# Patient Record
Sex: Female | Born: 1935 | Race: White | Hispanic: No | Smoking: Never smoker
Health system: Southern US, Community
[De-identification: ages and names within clinical notes are randomized; demographics above are authoritative.]

## PROBLEM LIST (undated history)

## (undated) DIAGNOSIS — F419 Anxiety disorder, unspecified: Secondary | ICD-10-CM

## (undated) DIAGNOSIS — F32A Depression, unspecified: Secondary | ICD-10-CM

## (undated) DIAGNOSIS — I2583 Coronary atherosclerosis due to lipid rich plaque: Secondary | ICD-10-CM

## (undated) DIAGNOSIS — Z66 Do not resuscitate: Secondary | ICD-10-CM

## (undated) DIAGNOSIS — J9600 Acute respiratory failure, unspecified whether with hypoxia or hypercapnia: Secondary | ICD-10-CM

## (undated) DIAGNOSIS — M1990 Unspecified osteoarthritis, unspecified site: Secondary | ICD-10-CM

## (undated) DIAGNOSIS — I739 Peripheral vascular disease, unspecified: Secondary | ICD-10-CM

## (undated) DIAGNOSIS — D649 Anemia, unspecified: Secondary | ICD-10-CM

## (undated) DIAGNOSIS — I499 Cardiac arrhythmia, unspecified: Secondary | ICD-10-CM

## (undated) DIAGNOSIS — I2510 Atherosclerotic heart disease of native coronary artery without angina pectoris: Secondary | ICD-10-CM

## (undated) DIAGNOSIS — K219 Gastro-esophageal reflux disease without esophagitis: Secondary | ICD-10-CM

## (undated) DIAGNOSIS — G2581 Restless legs syndrome: Secondary | ICD-10-CM

## (undated) DIAGNOSIS — I10 Essential (primary) hypertension: Secondary | ICD-10-CM

## (undated) DIAGNOSIS — F329 Major depressive disorder, single episode, unspecified: Secondary | ICD-10-CM

## (undated) DIAGNOSIS — N189 Chronic kidney disease, unspecified: Secondary | ICD-10-CM

## (undated) DIAGNOSIS — N318 Other neuromuscular dysfunction of bladder: Secondary | ICD-10-CM

## (undated) HISTORY — PX: COLON SURGERY: SHX602

## (undated) HISTORY — PX: TONSILLECTOMY: SUR1361

## (undated) HISTORY — PX: APPENDECTOMY: SHX54

## (undated) HISTORY — PX: ABDOMINAL HYSTERECTOMY: SHX81

## (undated) HISTORY — PX: CARDIAC CATHETERIZATION: SHX172

## (undated) HISTORY — PX: DIAPHRAGMATIC HERNIA REPAIR: SHX413

---

## 1997-08-17 ENCOUNTER — Encounter: Admission: RE | Admit: 1997-08-17 | Discharge: 1997-11-15 | Payer: Self-pay | Admitting: Internal Medicine

## 1997-09-25 ENCOUNTER — Inpatient Hospital Stay (HOSPITAL_COMMUNITY): Admission: EM | Admit: 1997-09-25 | Discharge: 1997-10-04 | Payer: Self-pay | Admitting: Emergency Medicine

## 1998-02-17 ENCOUNTER — Encounter: Admission: RE | Admit: 1998-02-17 | Discharge: 1998-03-02 | Payer: Self-pay | Admitting: Internal Medicine

## 1998-04-02 ENCOUNTER — Inpatient Hospital Stay (HOSPITAL_COMMUNITY): Admission: EM | Admit: 1998-04-02 | Discharge: 1998-04-08 | Payer: Self-pay | Admitting: Emergency Medicine

## 1998-06-06 ENCOUNTER — Encounter: Admission: RE | Admit: 1998-06-06 | Discharge: 1998-06-08 | Payer: Self-pay | Admitting: Orthopedic Surgery

## 1998-09-11 ENCOUNTER — Encounter: Admission: RE | Admit: 1998-09-11 | Discharge: 1998-09-15 | Payer: Self-pay | Admitting: Internal Medicine

## 1998-10-17 ENCOUNTER — Encounter: Admission: RE | Admit: 1998-10-17 | Discharge: 1999-01-15 | Payer: Self-pay | Admitting: Internal Medicine

## 1999-03-12 ENCOUNTER — Encounter: Admission: RE | Admit: 1999-03-12 | Discharge: 1999-06-10 | Payer: Self-pay | Admitting: Orthopedic Surgery

## 1999-04-25 ENCOUNTER — Ambulatory Visit (HOSPITAL_COMMUNITY): Admission: RE | Admit: 1999-04-25 | Discharge: 1999-04-25 | Payer: Self-pay | Admitting: Cardiology

## 1999-07-09 ENCOUNTER — Encounter: Admission: RE | Admit: 1999-07-09 | Discharge: 1999-10-07 | Payer: Self-pay | Admitting: Orthopedic Surgery

## 2000-01-01 ENCOUNTER — Encounter: Admission: RE | Admit: 2000-01-01 | Discharge: 2000-03-31 | Payer: Self-pay | Admitting: Orthopedic Surgery

## 2000-04-22 ENCOUNTER — Encounter: Admission: RE | Admit: 2000-04-22 | Discharge: 2000-04-28 | Payer: Self-pay

## 2000-08-12 ENCOUNTER — Encounter: Admission: RE | Admit: 2000-08-12 | Discharge: 2000-08-19 | Payer: Self-pay | Admitting: Orthopedic Surgery

## 2000-11-19 ENCOUNTER — Encounter: Admission: RE | Admit: 2000-11-19 | Discharge: 2000-11-24 | Payer: Self-pay | Admitting: Internal Medicine

## 2001-03-25 ENCOUNTER — Encounter: Admission: RE | Admit: 2001-03-25 | Discharge: 2001-03-30 | Payer: Self-pay | Admitting: Internal Medicine

## 2001-07-27 ENCOUNTER — Encounter (HOSPITAL_BASED_OUTPATIENT_CLINIC_OR_DEPARTMENT_OTHER): Admission: RE | Admit: 2001-07-27 | Discharge: 2001-08-07 | Payer: Self-pay | Admitting: Internal Medicine

## 2001-10-21 ENCOUNTER — Encounter (HOSPITAL_BASED_OUTPATIENT_CLINIC_OR_DEPARTMENT_OTHER): Admission: RE | Admit: 2001-10-21 | Discharge: 2002-01-19 | Payer: Self-pay | Admitting: Internal Medicine

## 2002-02-12 ENCOUNTER — Encounter (HOSPITAL_BASED_OUTPATIENT_CLINIC_OR_DEPARTMENT_OTHER): Admission: RE | Admit: 2002-02-12 | Discharge: 2002-05-13 | Payer: Self-pay | Admitting: Internal Medicine

## 2002-06-10 ENCOUNTER — Emergency Department (HOSPITAL_COMMUNITY): Admission: EM | Admit: 2002-06-10 | Discharge: 2002-06-11 | Payer: Self-pay | Admitting: Emergency Medicine

## 2002-06-11 ENCOUNTER — Encounter: Payer: Self-pay | Admitting: Emergency Medicine

## 2002-06-14 ENCOUNTER — Encounter: Payer: Self-pay | Admitting: Urology

## 2002-06-15 ENCOUNTER — Observation Stay (HOSPITAL_COMMUNITY): Admission: RE | Admit: 2002-06-15 | Discharge: 2002-06-16 | Payer: Self-pay | Admitting: Urology

## 2002-06-15 ENCOUNTER — Encounter: Payer: Self-pay | Admitting: Urology

## 2002-06-18 ENCOUNTER — Encounter (HOSPITAL_BASED_OUTPATIENT_CLINIC_OR_DEPARTMENT_OTHER): Admission: RE | Admit: 2002-06-18 | Discharge: 2002-09-16 | Payer: Self-pay | Admitting: Internal Medicine

## 2002-10-08 ENCOUNTER — Encounter (HOSPITAL_BASED_OUTPATIENT_CLINIC_OR_DEPARTMENT_OTHER): Admission: RE | Admit: 2002-10-08 | Discharge: 2002-10-22 | Payer: Self-pay | Admitting: Internal Medicine

## 2003-01-21 ENCOUNTER — Encounter (HOSPITAL_BASED_OUTPATIENT_CLINIC_OR_DEPARTMENT_OTHER): Admission: RE | Admit: 2003-01-21 | Discharge: 2003-03-04 | Payer: Self-pay | Admitting: Internal Medicine

## 2003-05-20 ENCOUNTER — Encounter (HOSPITAL_BASED_OUTPATIENT_CLINIC_OR_DEPARTMENT_OTHER): Admission: RE | Admit: 2003-05-20 | Discharge: 2003-06-02 | Payer: Self-pay | Admitting: Internal Medicine

## 2003-08-24 ENCOUNTER — Encounter (HOSPITAL_BASED_OUTPATIENT_CLINIC_OR_DEPARTMENT_OTHER): Admission: RE | Admit: 2003-08-24 | Discharge: 2003-09-09 | Payer: Self-pay | Admitting: Internal Medicine

## 2003-10-04 ENCOUNTER — Emergency Department (HOSPITAL_COMMUNITY): Admission: EM | Admit: 2003-10-04 | Discharge: 2003-10-04 | Payer: Self-pay | Admitting: Emergency Medicine

## 2003-11-29 ENCOUNTER — Encounter (HOSPITAL_BASED_OUTPATIENT_CLINIC_OR_DEPARTMENT_OTHER): Admission: RE | Admit: 2003-11-29 | Discharge: 2003-12-21 | Payer: Self-pay | Admitting: Internal Medicine

## 2004-03-12 ENCOUNTER — Encounter (HOSPITAL_BASED_OUTPATIENT_CLINIC_OR_DEPARTMENT_OTHER): Admission: RE | Admit: 2004-03-12 | Discharge: 2004-03-28 | Payer: Self-pay | Admitting: Internal Medicine

## 2004-12-05 ENCOUNTER — Ambulatory Visit: Payer: Self-pay | Admitting: Gastroenterology

## 2004-12-20 ENCOUNTER — Encounter (INDEPENDENT_AMBULATORY_CARE_PROVIDER_SITE_OTHER): Payer: Self-pay | Admitting: Specialist

## 2004-12-20 ENCOUNTER — Ambulatory Visit: Payer: Self-pay | Admitting: Gastroenterology

## 2004-12-20 DIAGNOSIS — K5730 Diverticulosis of large intestine without perforation or abscess without bleeding: Secondary | ICD-10-CM | POA: Insufficient documentation

## 2004-12-20 DIAGNOSIS — D126 Benign neoplasm of colon, unspecified: Secondary | ICD-10-CM

## 2004-12-20 DIAGNOSIS — K222 Esophageal obstruction: Secondary | ICD-10-CM

## 2005-01-02 ENCOUNTER — Ambulatory Visit: Payer: Self-pay | Admitting: Gastroenterology

## 2005-04-04 ENCOUNTER — Encounter (HOSPITAL_BASED_OUTPATIENT_CLINIC_OR_DEPARTMENT_OTHER): Admission: RE | Admit: 2005-04-04 | Discharge: 2005-04-19 | Payer: Self-pay | Admitting: Surgery

## 2005-05-01 ENCOUNTER — Encounter (HOSPITAL_BASED_OUTPATIENT_CLINIC_OR_DEPARTMENT_OTHER): Admission: RE | Admit: 2005-05-01 | Discharge: 2005-05-17 | Payer: Self-pay | Admitting: Internal Medicine

## 2006-06-25 ENCOUNTER — Encounter: Admission: RE | Admit: 2006-06-25 | Discharge: 2006-06-25 | Payer: Self-pay | Admitting: Cardiology

## 2008-02-14 ENCOUNTER — Encounter (INDEPENDENT_AMBULATORY_CARE_PROVIDER_SITE_OTHER): Payer: Self-pay | Admitting: *Deleted

## 2008-02-14 ENCOUNTER — Inpatient Hospital Stay (HOSPITAL_COMMUNITY): Admission: EM | Admit: 2008-02-14 | Discharge: 2008-02-19 | Payer: Self-pay | Admitting: Emergency Medicine

## 2008-02-15 ENCOUNTER — Encounter (INDEPENDENT_AMBULATORY_CARE_PROVIDER_SITE_OTHER): Payer: Self-pay | Admitting: *Deleted

## 2008-02-15 ENCOUNTER — Encounter (HOSPITAL_BASED_OUTPATIENT_CLINIC_OR_DEPARTMENT_OTHER): Payer: Self-pay | Admitting: Internal Medicine

## 2008-02-15 ENCOUNTER — Ambulatory Visit: Payer: Self-pay | Admitting: Vascular Surgery

## 2008-02-17 ENCOUNTER — Encounter (INDEPENDENT_AMBULATORY_CARE_PROVIDER_SITE_OTHER): Payer: Self-pay | Admitting: *Deleted

## 2008-02-18 ENCOUNTER — Encounter (INDEPENDENT_AMBULATORY_CARE_PROVIDER_SITE_OTHER): Payer: Self-pay | Admitting: *Deleted

## 2008-03-30 DIAGNOSIS — R269 Unspecified abnormalities of gait and mobility: Secondary | ICD-10-CM

## 2008-03-30 DIAGNOSIS — I2510 Atherosclerotic heart disease of native coronary artery without angina pectoris: Secondary | ICD-10-CM | POA: Insufficient documentation

## 2008-03-30 DIAGNOSIS — L97909 Non-pressure chronic ulcer of unspecified part of unspecified lower leg with unspecified severity: Secondary | ICD-10-CM

## 2008-03-30 DIAGNOSIS — K648 Other hemorrhoids: Secondary | ICD-10-CM | POA: Insufficient documentation

## 2008-03-30 DIAGNOSIS — I739 Peripheral vascular disease, unspecified: Secondary | ICD-10-CM

## 2008-03-30 DIAGNOSIS — K219 Gastro-esophageal reflux disease without esophagitis: Secondary | ICD-10-CM | POA: Insufficient documentation

## 2008-03-30 DIAGNOSIS — I10 Essential (primary) hypertension: Secondary | ICD-10-CM | POA: Insufficient documentation

## 2008-03-30 DIAGNOSIS — N318 Other neuromuscular dysfunction of bladder: Secondary | ICD-10-CM

## 2008-03-30 DIAGNOSIS — E119 Type 2 diabetes mellitus without complications: Secondary | ICD-10-CM | POA: Insufficient documentation

## 2008-03-30 DIAGNOSIS — I83009 Varicose veins of unspecified lower extremity with ulcer of unspecified site: Secondary | ICD-10-CM

## 2008-03-30 DIAGNOSIS — H04129 Dry eye syndrome of unspecified lacrimal gland: Secondary | ICD-10-CM | POA: Insufficient documentation

## 2008-03-30 DIAGNOSIS — M170 Bilateral primary osteoarthritis of knee: Secondary | ICD-10-CM | POA: Insufficient documentation

## 2008-03-30 DIAGNOSIS — G2581 Restless legs syndrome: Secondary | ICD-10-CM | POA: Insufficient documentation

## 2008-04-27 ENCOUNTER — Inpatient Hospital Stay (HOSPITAL_COMMUNITY): Admission: EM | Admit: 2008-04-27 | Discharge: 2008-04-29 | Payer: Self-pay | Admitting: Emergency Medicine

## 2008-05-06 ENCOUNTER — Ambulatory Visit (HOSPITAL_COMMUNITY): Admission: RE | Admit: 2008-05-06 | Discharge: 2008-05-06 | Payer: Self-pay | Admitting: Internal Medicine

## 2008-05-12 ENCOUNTER — Ambulatory Visit (HOSPITAL_COMMUNITY): Admission: RE | Admit: 2008-05-12 | Discharge: 2008-05-12 | Payer: Self-pay | Admitting: Internal Medicine

## 2008-10-14 ENCOUNTER — Inpatient Hospital Stay (HOSPITAL_COMMUNITY): Admission: EM | Admit: 2008-10-14 | Discharge: 2008-10-20 | Payer: Self-pay | Admitting: Emergency Medicine

## 2008-10-16 ENCOUNTER — Encounter (INDEPENDENT_AMBULATORY_CARE_PROVIDER_SITE_OTHER): Payer: Self-pay | Admitting: Internal Medicine

## 2008-10-19 ENCOUNTER — Encounter: Payer: Self-pay | Admitting: Cardiovascular Disease

## 2009-05-08 ENCOUNTER — Ambulatory Visit (HOSPITAL_COMMUNITY): Admission: RE | Admit: 2009-05-08 | Discharge: 2009-05-08 | Payer: Self-pay | Admitting: Internal Medicine

## 2009-06-30 ENCOUNTER — Ambulatory Visit: Payer: Self-pay | Admitting: Pulmonary Disease

## 2009-07-01 ENCOUNTER — Inpatient Hospital Stay (HOSPITAL_COMMUNITY): Admission: EM | Admit: 2009-07-01 | Discharge: 2009-07-12 | Payer: Self-pay | Admitting: Emergency Medicine

## 2009-07-01 ENCOUNTER — Encounter: Payer: Self-pay | Admitting: Pulmonary Disease

## 2009-08-17 ENCOUNTER — Ambulatory Visit (HOSPITAL_COMMUNITY): Admission: RE | Admit: 2009-08-17 | Discharge: 2009-08-17 | Payer: Self-pay | Admitting: Internal Medicine

## 2009-09-02 ENCOUNTER — Inpatient Hospital Stay (HOSPITAL_COMMUNITY): Admission: EM | Admit: 2009-09-02 | Discharge: 2009-09-12 | Payer: Self-pay | Admitting: Emergency Medicine

## 2009-09-29 ENCOUNTER — Inpatient Hospital Stay (HOSPITAL_COMMUNITY): Admission: EM | Admit: 2009-09-29 | Discharge: 2009-10-03 | Payer: Self-pay | Admitting: Emergency Medicine

## 2009-10-15 ENCOUNTER — Emergency Department (HOSPITAL_COMMUNITY): Admission: EM | Admit: 2009-10-15 | Discharge: 2009-10-16 | Payer: Self-pay | Admitting: Emergency Medicine

## 2009-10-20 ENCOUNTER — Ambulatory Visit (HOSPITAL_COMMUNITY): Admission: RE | Admit: 2009-10-20 | Discharge: 2009-10-20 | Payer: Self-pay | Admitting: General Surgery

## 2010-01-19 ENCOUNTER — Inpatient Hospital Stay (HOSPITAL_COMMUNITY): Admission: EM | Admit: 2010-01-19 | Discharge: 2010-01-23 | Payer: Self-pay | Source: Home / Self Care

## 2010-02-11 ENCOUNTER — Encounter (HOSPITAL_BASED_OUTPATIENT_CLINIC_OR_DEPARTMENT_OTHER): Payer: Self-pay | Admitting: Internal Medicine

## 2010-02-21 ENCOUNTER — Emergency Department (HOSPITAL_COMMUNITY)
Admission: EM | Admit: 2010-02-21 | Discharge: 2010-02-21 | Disposition: A | Discharge status: Home / Self Care | Payer: Medicare Other | Attending: Emergency Medicine | Admitting: Emergency Medicine

## 2010-02-21 DIAGNOSIS — IMO0002 Reserved for concepts with insufficient information to code with codable children: Secondary | ICD-10-CM | POA: Insufficient documentation

## 2010-02-21 DIAGNOSIS — I10 Essential (primary) hypertension: Secondary | ICD-10-CM | POA: Insufficient documentation

## 2010-02-21 DIAGNOSIS — I739 Peripheral vascular disease, unspecified: Secondary | ICD-10-CM | POA: Insufficient documentation

## 2010-04-02 LAB — GLUCOSE, CAPILLARY
Glucose-Capillary: 121 mg/dL — ABNORMAL HIGH (ref 70–99)
Glucose-Capillary: 122 mg/dL — ABNORMAL HIGH (ref 70–99)
Glucose-Capillary: 137 mg/dL — ABNORMAL HIGH (ref 70–99)
Glucose-Capillary: 137 mg/dL — ABNORMAL HIGH (ref 70–99)
Glucose-Capillary: 163 mg/dL — ABNORMAL HIGH (ref 70–99)
Glucose-Capillary: 165 mg/dL — ABNORMAL HIGH (ref 70–99)
Glucose-Capillary: 165 mg/dL — ABNORMAL HIGH (ref 70–99)
Glucose-Capillary: 170 mg/dL — ABNORMAL HIGH (ref 70–99)
Glucose-Capillary: 206 mg/dL — ABNORMAL HIGH (ref 70–99)
Glucose-Capillary: 207 mg/dL — ABNORMAL HIGH (ref 70–99)

## 2010-04-02 LAB — CBC
HCT: 39.4 % (ref 36.0–46.0)
Hemoglobin: 12.5 g/dL (ref 12.0–15.0)
MCH: 25.6 pg — ABNORMAL LOW (ref 26.0–34.0)
MCHC: 32.8 g/dL (ref 30.0–36.0)
MCV: 79.9 fL (ref 78.0–100.0)
Platelets: 179 10*3/uL (ref 150–400)
RBC: 4.92 MIL/uL (ref 3.87–5.11)
RDW: 15.9 % — ABNORMAL HIGH (ref 11.5–15.5)
RDW: 15.9 % — ABNORMAL HIGH (ref 11.5–15.5)
WBC: 7.7 10*3/uL (ref 4.0–10.5)

## 2010-04-02 LAB — URINALYSIS, ROUTINE W REFLEX MICROSCOPIC
Bilirubin Urine: NEGATIVE
Glucose, UA: NEGATIVE mg/dL
Hgb urine dipstick: NEGATIVE
Specific Gravity, Urine: 1.006 (ref 1.005–1.030)
Urobilinogen, UA: 0.2 mg/dL (ref 0.0–1.0)
pH: 7.5 (ref 5.0–8.0)

## 2010-04-02 LAB — URINE MICROSCOPIC-ADD ON

## 2010-04-02 LAB — COMPREHENSIVE METABOLIC PANEL
ALT: 13 U/L (ref 0–35)
AST: 20 U/L (ref 0–37)
Albumin: 3.4 g/dL — ABNORMAL LOW (ref 3.5–5.2)
CO2: 28 mEq/L (ref 19–32)
Calcium: 9.5 mg/dL (ref 8.4–10.5)
Chloride: 102 mEq/L (ref 96–112)
Creatinine, Ser: 0.92 mg/dL (ref 0.4–1.2)
GFR calc Af Amer: >60 mL/min (ref >60)
GFR calc non Af Amer: 58 mL/min — ABNORMAL LOW (ref >60)
Sodium: 137 mEq/L (ref 135–145)
Total Bilirubin: 0.1 mg/dL — ABNORMAL LOW (ref 0.3–1.2)

## 2010-04-02 LAB — BASIC METABOLIC PANEL
BUN: 13 mg/dL (ref 6–23)
BUN: 9 mg/dL (ref 6–23)
CO2: 24 mEq/L (ref 19–32)
Calcium: 9.3 mg/dL (ref 8.4–10.5)
Chloride: 106 mEq/L (ref 96–112)
GFR calc non Af Amer: >60 mL/min (ref >60)
Glucose, Bld: 155 mg/dL — ABNORMAL HIGH (ref 70–99)
Potassium: 4.5 mEq/L (ref 3.5–5.1)
Sodium: 139 mEq/L (ref 135–145)

## 2010-04-02 LAB — DIFFERENTIAL
Eosinophils Absolute: 0.2 10*3/uL (ref 0.0–0.7)
Eosinophils Relative: 3 % (ref 0–5)
Lymphocytes Relative: 40 % (ref 12–46)
Lymphs Abs: 2.7 10*3/uL (ref 0.7–4.0)
Monocytes Absolute: 0.7 10*3/uL (ref 0.1–1.0)

## 2010-04-02 LAB — LIPASE, BLOOD: Lipase: 24 U/L (ref 11–59)

## 2010-04-02 LAB — URINE CULTURE

## 2010-04-05 LAB — CBC
HCT: 34.9 % — ABNORMAL LOW (ref 36.0–46.0)
Hemoglobin: 11.3 g/dL — ABNORMAL LOW (ref 12.0–15.0)
MCH: 26.1 pg (ref 26.0–34.0)
MCH: 25.4 pg — ABNORMAL LOW (ref 26.0–34.0)
MCH: 26 pg (ref 26.0–34.0)
MCHC: 33.4 g/dL (ref 30.0–36.0)
MCHC: 31.8 g/dL (ref 30.0–36.0)
MCHC: 32 g/dL (ref 30.0–36.0)
MCHC: 32.4 g/dL (ref 30.0–36.0)
MCV: 79 fL (ref 78.0–100.0)
MCV: 80.1 fL (ref 78.0–100.0)
Platelets: 236 10*3/uL (ref 150–400)
Platelets: 146 10*3/uL — ABNORMAL LOW (ref 150–400)
Platelets: 189 10*3/uL (ref 150–400)
RBC: 4.35 MIL/uL (ref 3.87–5.11)
RBC: 4.76 MIL/uL (ref 3.87–5.11)
RDW: 15.8 % — ABNORMAL HIGH (ref 11.5–15.5)
RDW: 16 % — ABNORMAL HIGH (ref 11.5–15.5)
RDW: 16.2 % — ABNORMAL HIGH (ref 11.5–15.5)
WBC: 6.7 10*3/uL (ref 4.0–10.5)

## 2010-04-05 LAB — DIFFERENTIAL
Basophils Absolute: 0 10*3/uL (ref 0.0–0.1)
Basophils Relative: 0 % (ref 0–1)
Eosinophils Absolute: 0.3 10*3/uL (ref 0.0–0.7)
Eosinophils Relative: 3 % (ref 0–5)
Lymphocytes Relative: 28 % (ref 12–46)
Monocytes Absolute: 0.5 10*3/uL (ref 0.1–1.0)
Monocytes Absolute: 0.7 10*3/uL (ref 0.1–1.0)
Monocytes Relative: 7 % (ref 3–12)
Neutro Abs: 4.5 10*3/uL (ref 1.7–7.7)

## 2010-04-05 LAB — COMPREHENSIVE METABOLIC PANEL
Albumin: 2.9 g/dL — ABNORMAL LOW (ref 3.5–5.2)
BUN: 17 mg/dL (ref 6–23)
Calcium: 8.2 mg/dL — ABNORMAL LOW (ref 8.4–10.5)
Chloride: 98 mEq/L (ref 96–112)
Creatinine, Ser: 0.75 mg/dL (ref 0.4–1.2)
Total Bilirubin: 1.2 mg/dL (ref 0.3–1.2)
Total Protein: 7.4 g/dL (ref 6.0–8.3)

## 2010-04-05 LAB — BASIC METABOLIC PANEL
BUN: 10 mg/dL (ref 6–23)
BUN: 11 mg/dL (ref 6–23)
CO2: 26 mEq/L (ref 19–32)
CO2: 28 mEq/L (ref 19–32)
Calcium: 9.2 mg/dL (ref 8.4–10.5)
Calcium: 8.6 mg/dL (ref 8.4–10.5)
Calcium: 8.8 mg/dL (ref 8.4–10.5)
Calcium: 8.9 mg/dL (ref 8.4–10.5)
Creatinine, Ser: 0.67 mg/dL (ref 0.4–1.2)
Creatinine, Ser: 0.65 mg/dL (ref 0.4–1.2)
GFR calc Af Amer: >60 mL/min (ref >60)
GFR calc Af Amer: >60 mL/min (ref >60)
GFR calc Af Amer: >60 mL/min (ref >60)
GFR calc non Af Amer: >60 mL/min (ref >60)
GFR calc non Af Amer: >60 mL/min (ref >60)
GFR calc non Af Amer: >60 mL/min (ref >60)
GFR calc non Af Amer: >60 mL/min (ref >60)
Glucose, Bld: 129 mg/dL — ABNORMAL HIGH (ref 70–99)
Glucose, Bld: 133 mg/dL — ABNORMAL HIGH (ref 70–99)
Glucose, Bld: 164 mg/dL — ABNORMAL HIGH (ref 70–99)
Potassium: 3.8 mEq/L (ref 3.5–5.1)
Sodium: 138 mEq/L (ref 135–145)

## 2010-04-05 LAB — URINALYSIS, MICROSCOPIC ONLY
Bilirubin Urine: NEGATIVE
Glucose, UA: NEGATIVE mg/dL
Nitrite: NEGATIVE
Specific Gravity, Urine: 1.025 (ref 1.005–1.030)
pH: 5.5 (ref 5.0–8.0)

## 2010-04-05 LAB — URINE CULTURE
Colony Count: NO GROWTH
Colony Count: 10000
Culture  Setup Time: 201109260016
Culture  Setup Time: 201109090836
Culture  Setup Time: 201109091140
Culture: NO GROWTH

## 2010-04-05 LAB — URINALYSIS, ROUTINE W REFLEX MICROSCOPIC
Bilirubin Urine: NEGATIVE
Glucose, UA: NEGATIVE mg/dL
Hgb urine dipstick: NEGATIVE
Ketones, ur: NEGATIVE mg/dL
Nitrite: NEGATIVE
Protein, ur: NEGATIVE mg/dL
Protein, ur: NEGATIVE mg/dL
Urobilinogen, UA: 0.2 mg/dL (ref 0.0–1.0)
pH: 5.5 (ref 5.0–8.0)

## 2010-04-05 LAB — GLUCOSE, CAPILLARY
Glucose-Capillary: 102 mg/dL — ABNORMAL HIGH (ref 70–99)
Glucose-Capillary: 125 mg/dL — ABNORMAL HIGH (ref 70–99)
Glucose-Capillary: 131 mg/dL — ABNORMAL HIGH (ref 70–99)
Glucose-Capillary: 132 mg/dL — ABNORMAL HIGH (ref 70–99)
Glucose-Capillary: 140 mg/dL — ABNORMAL HIGH (ref 70–99)
Glucose-Capillary: 146 mg/dL — ABNORMAL HIGH (ref 70–99)
Glucose-Capillary: 147 mg/dL — ABNORMAL HIGH (ref 70–99)
Glucose-Capillary: 156 mg/dL — ABNORMAL HIGH (ref 70–99)
Glucose-Capillary: 170 mg/dL — ABNORMAL HIGH (ref 70–99)
Glucose-Capillary: 172 mg/dL — ABNORMAL HIGH (ref 70–99)

## 2010-04-05 LAB — CK TOTAL AND CKMB (NOT AT ARMC)
Relative Index: INVALID (ref 0.0–2.5)
Total CK: 30 U/L (ref 7–177)

## 2010-04-05 LAB — MAGNESIUM: Magnesium: 1.8 mg/dL (ref 1.5–2.5)

## 2010-04-05 LAB — URINE MICROSCOPIC-ADD ON

## 2010-04-05 LAB — MRSA PCR SCREENING: MRSA by PCR: NEGATIVE

## 2010-04-05 LAB — POTASSIUM: Potassium: 3.8 mEq/L (ref 3.5–5.1)

## 2010-04-06 LAB — POCT I-STAT, CHEM 8
Calcium, Ion: 1.16 mmol/L (ref 1.12–1.32)
Chloride: 101 mEq/L (ref 96–112)
Glucose, Bld: 204 mg/dL — ABNORMAL HIGH (ref 70–99)
HCT: 42 % (ref 36.0–46.0)
TCO2: 27 mmol/L (ref 0–100)

## 2010-04-06 LAB — CBC
HCT: 31.2 % — ABNORMAL LOW (ref 36.0–46.0)
HCT: 32.1 % — ABNORMAL LOW (ref 36.0–46.0)
HCT: 32.9 % — ABNORMAL LOW (ref 36.0–46.0)
HCT: 36.2 % (ref 36.0–46.0)
HCT: 38.1 % (ref 36.0–46.0)
Hemoglobin: 10.4 g/dL — ABNORMAL LOW (ref 12.0–15.0)
Hemoglobin: 12.1 g/dL (ref 12.0–15.0)
Hemoglobin: 12.9 g/dL (ref 12.0–15.0)
MCH: 26.8 pg (ref 26.0–34.0)
MCH: 27.4 pg (ref 26.0–34.0)
MCHC: 33 g/dL (ref 30.0–36.0)
MCHC: 33.4 g/dL (ref 30.0–36.0)
MCHC: 32.9 g/dL (ref 30.0–36.0)
MCHC: 33.7 g/dL (ref 30.0–36.0)
MCV: 80.2 fL (ref 78.0–100.0)
MCV: 80.3 fL (ref 78.0–100.0)
MCV: 81.2 fL (ref 78.0–100.0)
MCV: 81.1 fL (ref 78.0–100.0)
Platelets: 282 10*3/uL (ref 150–400)
Platelets: 290 10*3/uL (ref 150–400)
Platelets: 308 10*3/uL (ref 150–400)
Platelets: 254 10*3/uL (ref 150–400)
RBC: 3.88 MIL/uL (ref 3.87–5.11)
RDW: 16.3 % — ABNORMAL HIGH (ref 11.5–15.5)
RDW: 16.3 % — ABNORMAL HIGH (ref 11.5–15.5)
RDW: 16.5 % — ABNORMAL HIGH (ref 11.5–15.5)
RDW: 16.3 % — ABNORMAL HIGH (ref 11.5–15.5)
RDW: 16.3 % — ABNORMAL HIGH (ref 11.5–15.5)
RDW: 16.4 % — ABNORMAL HIGH (ref 11.5–15.5)
WBC: 6.7 10*3/uL (ref 4.0–10.5)
WBC: 8.9 10*3/uL (ref 4.0–10.5)
WBC: 9.3 10*3/uL (ref 4.0–10.5)
WBC: 15.4 10*3/uL — ABNORMAL HIGH (ref 4.0–10.5)

## 2010-04-06 LAB — BASIC METABOLIC PANEL
BUN: 11 mg/dL (ref 6–23)
BUN: 11 mg/dL (ref 6–23)
BUN: 9 mg/dL (ref 6–23)
BUN: 9 mg/dL (ref 6–23)
CO2: 25 mEq/L (ref 19–32)
CO2: 25 mEq/L (ref 19–32)
CO2: 27 mEq/L (ref 19–32)
CO2: 27 mEq/L (ref 19–32)
CO2: 28 mEq/L (ref 19–32)
Calcium: 8.4 mg/dL (ref 8.4–10.5)
Calcium: 8.5 mg/dL (ref 8.4–10.5)
Calcium: 8.7 mg/dL (ref 8.4–10.5)
Calcium: 8.9 mg/dL (ref 8.4–10.5)
Chloride: 105 mEq/L (ref 96–112)
Chloride: 105 mEq/L (ref 96–112)
Chloride: 106 mEq/L (ref 96–112)
Chloride: 106 mEq/L (ref 96–112)
Chloride: 107 mEq/L (ref 96–112)
Creatinine, Ser: 0.48 mg/dL (ref 0.4–1.2)
Creatinine, Ser: 0.52 mg/dL (ref 0.4–1.2)
Creatinine, Ser: 0.59 mg/dL (ref 0.4–1.2)
GFR calc Af Amer: >60 mL/min (ref >60)
GFR calc Af Amer: >60 mL/min (ref >60)
GFR calc Af Amer: >60 mL/min (ref >60)
GFR calc Af Amer: >60 mL/min (ref >60)
GFR calc non Af Amer: >60 mL/min (ref >60)
GFR calc non Af Amer: >60 mL/min (ref >60)
GFR calc non Af Amer: 46 mL/min — ABNORMAL LOW (ref >60)
Glucose, Bld: 132 mg/dL — ABNORMAL HIGH (ref 70–99)
Glucose, Bld: 154 mg/dL — ABNORMAL HIGH (ref 70–99)
Potassium: 3.9 mEq/L (ref 3.5–5.1)
Potassium: 4 mEq/L (ref 3.5–5.1)
Potassium: 3.8 mEq/L (ref 3.5–5.1)
Sodium: 135 mEq/L (ref 135–145)
Sodium: 137 mEq/L (ref 135–145)
Sodium: 138 mEq/L (ref 135–145)

## 2010-04-06 LAB — GLUCOSE, CAPILLARY
Glucose-Capillary: 110 mg/dL — ABNORMAL HIGH (ref 70–99)
Glucose-Capillary: 145 mg/dL — ABNORMAL HIGH (ref 70–99)
Glucose-Capillary: 148 mg/dL — ABNORMAL HIGH (ref 70–99)
Glucose-Capillary: 153 mg/dL — ABNORMAL HIGH (ref 70–99)
Glucose-Capillary: 156 mg/dL — ABNORMAL HIGH (ref 70–99)
Glucose-Capillary: 163 mg/dL — ABNORMAL HIGH (ref 70–99)
Glucose-Capillary: 175 mg/dL — ABNORMAL HIGH (ref 70–99)
Glucose-Capillary: 177 mg/dL — ABNORMAL HIGH (ref 70–99)
Glucose-Capillary: 180 mg/dL — ABNORMAL HIGH (ref 70–99)
Glucose-Capillary: 184 mg/dL — ABNORMAL HIGH (ref 70–99)
Glucose-Capillary: 185 mg/dL — ABNORMAL HIGH (ref 70–99)
Glucose-Capillary: 192 mg/dL — ABNORMAL HIGH (ref 70–99)
Glucose-Capillary: 193 mg/dL — ABNORMAL HIGH (ref 70–99)
Glucose-Capillary: 208 mg/dL — ABNORMAL HIGH (ref 70–99)
Glucose-Capillary: 220 mg/dL — ABNORMAL HIGH (ref 70–99)
Glucose-Capillary: 239 mg/dL — ABNORMAL HIGH (ref 70–99)
Glucose-Capillary: 241 mg/dL — ABNORMAL HIGH (ref 70–99)
Glucose-Capillary: 266 mg/dL — ABNORMAL HIGH (ref 70–99)
Glucose-Capillary: 275 mg/dL — ABNORMAL HIGH (ref 70–99)
Glucose-Capillary: 304 mg/dL — ABNORMAL HIGH (ref 70–99)
Glucose-Capillary: 115 mg/dL — ABNORMAL HIGH (ref 70–99)
Glucose-Capillary: 117 mg/dL — ABNORMAL HIGH (ref 70–99)
Glucose-Capillary: 127 mg/dL — ABNORMAL HIGH (ref 70–99)
Glucose-Capillary: 153 mg/dL — ABNORMAL HIGH (ref 70–99)
Glucose-Capillary: 155 mg/dL — ABNORMAL HIGH (ref 70–99)
Glucose-Capillary: 170 mg/dL — ABNORMAL HIGH (ref 70–99)

## 2010-04-06 LAB — DIFFERENTIAL
Basophils Absolute: 0 10*3/uL (ref 0.0–0.1)
Basophils Relative: 0 % (ref 0–1)
Eosinophils Absolute: 0.1 10*3/uL (ref 0.0–0.7)
Eosinophils Absolute: 0.1 10*3/uL (ref 0.0–0.7)
Eosinophils Relative: 0 % (ref 0–5)
Lymphocytes Relative: 10 % — ABNORMAL LOW (ref 12–46)
Lymphs Abs: 1.2 10*3/uL (ref 0.7–4.0)
Monocytes Absolute: 0.4 10*3/uL (ref 0.1–1.0)
Monocytes Absolute: 0.4 10*3/uL (ref 0.1–1.0)
Monocytes Absolute: 0.6 10*3/uL (ref 0.1–1.0)
Monocytes Relative: 4 % (ref 3–12)
Monocytes Relative: 3 % (ref 3–12)
Monocytes Relative: 4 % (ref 3–12)
Neutro Abs: 7.6 10*3/uL (ref 1.7–7.7)
Neutro Abs: 13.4 10*3/uL — ABNORMAL HIGH (ref 1.7–7.7)
Neutrophils Relative %: 81 % — ABNORMAL HIGH (ref 43–77)

## 2010-04-06 LAB — URINALYSIS, ROUTINE W REFLEX MICROSCOPIC
Glucose, UA: NEGATIVE mg/dL
Ketones, ur: NEGATIVE mg/dL
Protein, ur: 100 mg/dL — AB

## 2010-04-06 LAB — CULTURE, BLOOD (ROUTINE X 2): Culture: NO GROWTH

## 2010-04-06 LAB — ANAEROBIC CULTURE

## 2010-04-06 LAB — COMPREHENSIVE METABOLIC PANEL
ALT: 17 U/L (ref 0–35)
ALT: 25 U/L (ref 0–35)
AST: 16 U/L (ref 0–37)
Albumin: 2.2 g/dL — ABNORMAL LOW (ref 3.5–5.2)
Albumin: 2.1 g/dL — ABNORMAL LOW (ref 3.5–5.2)
Alkaline Phosphatase: 80 U/L (ref 39–117)
Alkaline Phosphatase: 84 U/L (ref 39–117)
BUN: 10 mg/dL (ref 6–23)
BUN: 12 mg/dL (ref 6–23)
CO2: 24 mEq/L (ref 19–32)
CO2: 25 mEq/L (ref 19–32)
Calcium: 8.2 mg/dL — ABNORMAL LOW (ref 8.4–10.5)
Calcium: 8.4 mg/dL (ref 8.4–10.5)
Chloride: 104 mEq/L (ref 96–112)
Chloride: 105 mEq/L (ref 96–112)
Creatinine, Ser: 0.63 mg/dL (ref 0.4–1.2)
GFR calc Af Amer: >60 mL/min (ref >60)
GFR calc non Af Amer: >60 mL/min (ref >60)
GFR calc non Af Amer: >60 mL/min (ref >60)
Glucose, Bld: 204 mg/dL — ABNORMAL HIGH (ref 70–99)
Glucose, Bld: 225 mg/dL — ABNORMAL HIGH (ref 70–99)
Potassium: 3.9 mEq/L (ref 3.5–5.1)
Sodium: 138 mEq/L (ref 135–145)
Sodium: 141 mEq/L (ref 135–145)
Total Bilirubin: 0.3 mg/dL (ref 0.3–1.2)
Total Bilirubin: 0.4 mg/dL (ref 0.3–1.2)
Total Protein: 6.4 g/dL (ref 6.0–8.3)
Total Protein: 6.4 g/dL (ref 6.0–8.3)

## 2010-04-06 LAB — BLOOD GAS, ARTERIAL
Acid-Base Excess: 4 mmol/L — ABNORMAL HIGH (ref 0.0–2.0)
Drawn by: 326301
O2 Saturation: 93.2 %
Patient temperature: 102.3
pO2, Arterial: 70.8 mmHg — ABNORMAL LOW (ref 80.0–100.0)

## 2010-04-06 LAB — URINE MICROSCOPIC-ADD ON

## 2010-04-06 LAB — PROTIME-INR: INR: 1.33 (ref 0.00–1.49)

## 2010-04-06 LAB — BODY FLUID CULTURE

## 2010-04-06 LAB — TSH: TSH: 2.425 u[IU]/mL (ref 0.350–4.500)

## 2010-04-06 LAB — PHOSPHORUS: Phosphorus: 3.1 mg/dL (ref 2.3–4.6)

## 2010-04-06 LAB — URINE CULTURE

## 2010-04-08 LAB — PHOSPHORUS: Phosphorus: 3.7 mg/dL (ref 2.3–4.6)

## 2010-04-08 LAB — GLUCOSE, CAPILLARY
Glucose-Capillary: 108 mg/dL — ABNORMAL HIGH (ref 70–99)
Glucose-Capillary: 112 mg/dL — ABNORMAL HIGH (ref 70–99)
Glucose-Capillary: 119 mg/dL — ABNORMAL HIGH (ref 70–99)
Glucose-Capillary: 125 mg/dL — ABNORMAL HIGH (ref 70–99)
Glucose-Capillary: 131 mg/dL — ABNORMAL HIGH (ref 70–99)
Glucose-Capillary: 132 mg/dL — ABNORMAL HIGH (ref 70–99)
Glucose-Capillary: 134 mg/dL — ABNORMAL HIGH (ref 70–99)
Glucose-Capillary: 137 mg/dL — ABNORMAL HIGH (ref 70–99)
Glucose-Capillary: 138 mg/dL — ABNORMAL HIGH (ref 70–99)
Glucose-Capillary: 138 mg/dL — ABNORMAL HIGH (ref 70–99)
Glucose-Capillary: 140 mg/dL — ABNORMAL HIGH (ref 70–99)
Glucose-Capillary: 145 mg/dL — ABNORMAL HIGH (ref 70–99)
Glucose-Capillary: 151 mg/dL — ABNORMAL HIGH (ref 70–99)
Glucose-Capillary: 154 mg/dL — ABNORMAL HIGH (ref 70–99)
Glucose-Capillary: 155 mg/dL — ABNORMAL HIGH (ref 70–99)
Glucose-Capillary: 155 mg/dL — ABNORMAL HIGH (ref 70–99)
Glucose-Capillary: 161 mg/dL — ABNORMAL HIGH (ref 70–99)
Glucose-Capillary: 162 mg/dL — ABNORMAL HIGH (ref 70–99)
Glucose-Capillary: 164 mg/dL — ABNORMAL HIGH (ref 70–99)
Glucose-Capillary: 167 mg/dL — ABNORMAL HIGH (ref 70–99)
Glucose-Capillary: 168 mg/dL — ABNORMAL HIGH (ref 70–99)
Glucose-Capillary: 168 mg/dL — ABNORMAL HIGH (ref 70–99)
Glucose-Capillary: 173 mg/dL — ABNORMAL HIGH (ref 70–99)
Glucose-Capillary: 173 mg/dL — ABNORMAL HIGH (ref 70–99)
Glucose-Capillary: 173 mg/dL — ABNORMAL HIGH (ref 70–99)
Glucose-Capillary: 174 mg/dL — ABNORMAL HIGH (ref 70–99)
Glucose-Capillary: 185 mg/dL — ABNORMAL HIGH (ref 70–99)
Glucose-Capillary: 188 mg/dL — ABNORMAL HIGH (ref 70–99)
Glucose-Capillary: 193 mg/dL — ABNORMAL HIGH (ref 70–99)
Glucose-Capillary: 194 mg/dL — ABNORMAL HIGH (ref 70–99)
Glucose-Capillary: 195 mg/dL — ABNORMAL HIGH (ref 70–99)
Glucose-Capillary: 199 mg/dL — ABNORMAL HIGH (ref 70–99)
Glucose-Capillary: 203 mg/dL — ABNORMAL HIGH (ref 70–99)
Glucose-Capillary: 219 mg/dL — ABNORMAL HIGH (ref 70–99)
Glucose-Capillary: 221 mg/dL — ABNORMAL HIGH (ref 70–99)
Glucose-Capillary: 225 mg/dL — ABNORMAL HIGH (ref 70–99)
Glucose-Capillary: 226 mg/dL — ABNORMAL HIGH (ref 70–99)
Glucose-Capillary: 228 mg/dL — ABNORMAL HIGH (ref 70–99)
Glucose-Capillary: 231 mg/dL — ABNORMAL HIGH (ref 70–99)
Glucose-Capillary: 236 mg/dL — ABNORMAL HIGH (ref 70–99)
Glucose-Capillary: 240 mg/dL — ABNORMAL HIGH (ref 70–99)
Glucose-Capillary: 242 mg/dL — ABNORMAL HIGH (ref 70–99)
Glucose-Capillary: 304 mg/dL — ABNORMAL HIGH (ref 70–99)

## 2010-04-08 LAB — DIFFERENTIAL
Basophils Relative: 0 % (ref 0–1)
Eosinophils Absolute: 0.2 10*3/uL (ref 0.0–0.7)
Lymphs Abs: 1.2 10*3/uL (ref 0.7–4.0)
Neutrophils Relative %: 82 % — ABNORMAL HIGH (ref 43–77)

## 2010-04-08 LAB — COMPREHENSIVE METABOLIC PANEL
ALT: 12 U/L (ref 0–35)
ALT: 13 U/L (ref 0–35)
AST: 10 U/L (ref 0–37)
AST: 12 U/L (ref 0–37)
AST: 18 U/L (ref 0–37)
Albumin: 1.6 g/dL — ABNORMAL LOW (ref 3.5–5.2)
Albumin: 1.7 g/dL — ABNORMAL LOW (ref 3.5–5.2)
Alkaline Phosphatase: 48 U/L (ref 39–117)
BUN: 12 mg/dL (ref 6–23)
CO2: 23 mEq/L (ref 19–32)
Calcium: 6.9 mg/dL — ABNORMAL LOW (ref 8.4–10.5)
Calcium: 7.1 mg/dL — ABNORMAL LOW (ref 8.4–10.5)
Calcium: 7.3 mg/dL — ABNORMAL LOW (ref 8.4–10.5)
Chloride: 110 mEq/L (ref 96–112)
Creatinine, Ser: 0.68 mg/dL (ref 0.4–1.2)
GFR calc Af Amer: >60 mL/min (ref >60)
GFR calc Af Amer: >60 mL/min (ref >60)
GFR calc non Af Amer: >60 mL/min (ref >60)
GFR calc non Af Amer: >60 mL/min (ref >60)
Glucose, Bld: 217 mg/dL — ABNORMAL HIGH (ref 70–99)
Potassium: 3.6 mEq/L (ref 3.5–5.1)
Sodium: 136 mEq/L (ref 135–145)
Sodium: 136 mEq/L (ref 135–145)
Total Bilirubin: 0.8 mg/dL (ref 0.3–1.2)
Total Protein: 5.2 g/dL — ABNORMAL LOW (ref 6.0–8.3)

## 2010-04-08 LAB — CBC
HCT: 25.7 % — ABNORMAL LOW (ref 36.0–46.0)
Hemoglobin: 10 g/dL — ABNORMAL LOW (ref 12.0–15.0)
Hemoglobin: 8.7 g/dL — ABNORMAL LOW (ref 12.0–15.0)
MCHC: 33.9 g/dL (ref 30.0–36.0)
MCHC: 34.1 g/dL (ref 30.0–36.0)
MCHC: 34.2 g/dL (ref 30.0–36.0)
MCHC: 34.3 g/dL (ref 30.0–36.0)
MCHC: 34.5 g/dL (ref 30.0–36.0)
MCV: 86.5 fL (ref 78.0–100.0)
MCV: 86.9 fL (ref 78.0–100.0)
MCV: 87.4 fL (ref 78.0–100.0)
MCV: 87.5 fL (ref 78.0–100.0)
Platelets: 187 10*3/uL (ref 150–400)
Platelets: 228 10*3/uL (ref 150–400)
Platelets: 285 10*3/uL (ref 150–400)
Platelets: 98 10*3/uL — ABNORMAL LOW (ref 150–400)
RBC: 2.96 MIL/uL — ABNORMAL LOW (ref 3.87–5.11)
RBC: 2.97 MIL/uL — ABNORMAL LOW (ref 3.87–5.11)
RBC: 3.36 MIL/uL — ABNORMAL LOW (ref 3.87–5.11)
RDW: 15.1 % (ref 11.5–15.5)
RDW: 15.7 % — ABNORMAL HIGH (ref 11.5–15.5)
WBC: 11.4 10*3/uL — ABNORMAL HIGH (ref 4.0–10.5)
WBC: 12 10*3/uL — ABNORMAL HIGH (ref 4.0–10.5)
WBC: 9.6 10*3/uL (ref 4.0–10.5)
WBC: 9.9 10*3/uL (ref 4.0–10.5)

## 2010-04-08 LAB — BASIC METABOLIC PANEL
BUN: 10 mg/dL (ref 6–23)
BUN: 13 mg/dL (ref 6–23)
CO2: 24 mEq/L (ref 19–32)
CO2: 25 mEq/L (ref 19–32)
Calcium: 7.1 mg/dL — ABNORMAL LOW (ref 8.4–10.5)
Chloride: 109 mEq/L (ref 96–112)
Creatinine, Ser: 0.6 mg/dL (ref 0.4–1.2)
GFR calc Af Amer: >60 mL/min (ref >60)
GFR calc Af Amer: >60 mL/min (ref >60)
GFR calc non Af Amer: >60 mL/min (ref >60)
GFR calc non Af Amer: >60 mL/min (ref >60)
GFR calc non Af Amer: >60 mL/min (ref >60)
Glucose, Bld: 192 mg/dL — ABNORMAL HIGH (ref 70–99)
Potassium: 3.7 mEq/L (ref 3.5–5.1)
Potassium: 3.9 mEq/L (ref 3.5–5.1)
Sodium: 138 mEq/L (ref 135–145)

## 2010-04-08 LAB — CULTURE, RESPIRATORY

## 2010-04-08 LAB — CULTURE, RESPIRATORY W GRAM STAIN

## 2010-04-08 LAB — MAGNESIUM: Magnesium: 1.5 mg/dL (ref 1.5–2.5)

## 2010-04-09 LAB — COMPREHENSIVE METABOLIC PANEL
ALT: 15 U/L (ref 0–35)
AST: 17 U/L (ref 0–37)
AST: 27 U/L (ref 0–37)
AST: 32 U/L (ref 0–37)
Albumin: 1.5 g/dL — ABNORMAL LOW (ref 3.5–5.2)
Albumin: 1.7 g/dL — ABNORMAL LOW (ref 3.5–5.2)
Albumin: 2.8 g/dL — ABNORMAL LOW (ref 3.5–5.2)
Alkaline Phosphatase: 32 U/L — ABNORMAL LOW (ref 39–117)
Alkaline Phosphatase: 32 U/L — ABNORMAL LOW (ref 39–117)
Alkaline Phosphatase: 47 U/L (ref 39–117)
Alkaline Phosphatase: 69 U/L (ref 39–117)
BUN: 16 mg/dL (ref 6–23)
BUN: 53 mg/dL — ABNORMAL HIGH (ref 6–23)
CO2: 12 mEq/L — ABNORMAL LOW (ref 19–32)
CO2: 18 mEq/L — ABNORMAL LOW (ref 19–32)
CO2: 19 mEq/L (ref 19–32)
Calcium: 6.9 mg/dL — ABNORMAL LOW (ref 8.4–10.5)
Calcium: 7.2 mg/dL — ABNORMAL LOW (ref 8.4–10.5)
Calcium: 7.7 mg/dL — ABNORMAL LOW (ref 8.4–10.5)
Chloride: 103 mEq/L (ref 96–112)
Chloride: 115 mEq/L — ABNORMAL HIGH (ref 96–112)
Chloride: 116 mEq/L — ABNORMAL HIGH (ref 96–112)
Chloride: 119 mEq/L — ABNORMAL HIGH (ref 96–112)
Chloride: 99 mEq/L (ref 96–112)
Creatinine, Ser: 0.82 mg/dL (ref 0.4–1.2)
Creatinine, Ser: 1.37 mg/dL — ABNORMAL HIGH (ref 0.4–1.2)
Creatinine, Ser: 2.63 mg/dL — ABNORMAL HIGH (ref 0.4–1.2)
Creatinine, Ser: 3.32 mg/dL — ABNORMAL HIGH (ref 0.4–1.2)
GFR calc Af Amer: 16 mL/min — ABNORMAL LOW (ref >60)
GFR calc Af Amer: 21 mL/min — ABNORMAL LOW (ref >60)
GFR calc Af Amer: 44 mL/min — ABNORMAL LOW (ref >60)
GFR calc Af Amer: >60 mL/min (ref >60)
GFR calc non Af Amer: 13 mL/min — ABNORMAL LOW (ref >60)
GFR calc non Af Amer: 17 mL/min — ABNORMAL LOW (ref >60)
GFR calc non Af Amer: 24 mL/min — ABNORMAL LOW (ref >60)
GFR calc non Af Amer: 37 mL/min — ABNORMAL LOW (ref >60)
Glucose, Bld: 131 mg/dL — ABNORMAL HIGH (ref 70–99)
Glucose, Bld: 334 mg/dL — ABNORMAL HIGH (ref 70–99)
Glucose, Bld: 503 mg/dL — ABNORMAL HIGH (ref 70–99)
Potassium: 3.6 mEq/L (ref 3.5–5.1)
Potassium: 5 mEq/L (ref 3.5–5.1)
Sodium: 138 mEq/L (ref 135–145)
Sodium: 142 mEq/L (ref 135–145)
Total Bilirubin: 0.5 mg/dL (ref 0.3–1.2)
Total Bilirubin: 0.5 mg/dL (ref 0.3–1.2)
Total Bilirubin: 0.6 mg/dL (ref 0.3–1.2)
Total Bilirubin: 0.9 mg/dL (ref 0.3–1.2)
Total Bilirubin: 1.1 mg/dL (ref 0.3–1.2)
Total Bilirubin: 1.1 mg/dL (ref 0.3–1.2)
Total Protein: 5 g/dL — ABNORMAL LOW (ref 6.0–8.3)

## 2010-04-09 LAB — GLUCOSE, CAPILLARY
Glucose-Capillary: 108 mg/dL — ABNORMAL HIGH (ref 70–99)
Glucose-Capillary: 114 mg/dL — ABNORMAL HIGH (ref 70–99)
Glucose-Capillary: 117 mg/dL — ABNORMAL HIGH (ref 70–99)
Glucose-Capillary: 127 mg/dL — ABNORMAL HIGH (ref 70–99)
Glucose-Capillary: 137 mg/dL — ABNORMAL HIGH (ref 70–99)
Glucose-Capillary: 140 mg/dL — ABNORMAL HIGH (ref 70–99)
Glucose-Capillary: 145 mg/dL — ABNORMAL HIGH (ref 70–99)
Glucose-Capillary: 150 mg/dL — ABNORMAL HIGH (ref 70–99)
Glucose-Capillary: 160 mg/dL — ABNORMAL HIGH (ref 70–99)
Glucose-Capillary: 164 mg/dL — ABNORMAL HIGH (ref 70–99)
Glucose-Capillary: 176 mg/dL — ABNORMAL HIGH (ref 70–99)
Glucose-Capillary: 184 mg/dL — ABNORMAL HIGH (ref 70–99)
Glucose-Capillary: 190 mg/dL — ABNORMAL HIGH (ref 70–99)
Glucose-Capillary: 190 mg/dL — ABNORMAL HIGH (ref 70–99)
Glucose-Capillary: 191 mg/dL — ABNORMAL HIGH (ref 70–99)
Glucose-Capillary: 199 mg/dL — ABNORMAL HIGH (ref 70–99)
Glucose-Capillary: 201 mg/dL — ABNORMAL HIGH (ref 70–99)
Glucose-Capillary: 204 mg/dL — ABNORMAL HIGH (ref 70–99)
Glucose-Capillary: 208 mg/dL — ABNORMAL HIGH (ref 70–99)
Glucose-Capillary: 210 mg/dL — ABNORMAL HIGH (ref 70–99)
Glucose-Capillary: 210 mg/dL — ABNORMAL HIGH (ref 70–99)
Glucose-Capillary: 239 mg/dL — ABNORMAL HIGH (ref 70–99)
Glucose-Capillary: 242 mg/dL — ABNORMAL HIGH (ref 70–99)
Glucose-Capillary: 299 mg/dL — ABNORMAL HIGH (ref 70–99)
Glucose-Capillary: 330 mg/dL — ABNORMAL HIGH (ref 70–99)
Glucose-Capillary: 359 mg/dL — ABNORMAL HIGH (ref 70–99)
Glucose-Capillary: 390 mg/dL — ABNORMAL HIGH (ref 70–99)
Glucose-Capillary: 503 mg/dL — ABNORMAL HIGH (ref 70–99)
Glucose-Capillary: 555 mg/dL (ref 70–99)
Glucose-Capillary: 99 mg/dL (ref 70–99)

## 2010-04-09 LAB — CK TOTAL AND CKMB (NOT AT ARMC)
CK, MB: 1.9 ng/mL (ref 0.3–4.0)
CK, MB: 4.3 ng/mL — ABNORMAL HIGH (ref 0.3–4.0)
Relative Index: 1.2 (ref 0.0–2.5)
Relative Index: 1.8 (ref 0.0–2.5)
Total CK: 154 U/L (ref 7–177)
Total CK: 241 U/L — ABNORMAL HIGH (ref 7–177)

## 2010-04-09 LAB — APTT: aPTT: 41 s — ABNORMAL HIGH (ref 24–37)

## 2010-04-09 LAB — BASIC METABOLIC PANEL
CO2: 23 mEq/L (ref 19–32)
Chloride: 115 mEq/L — ABNORMAL HIGH (ref 96–112)
Creatinine, Ser: 0.68 mg/dL (ref 0.4–1.2)
GFR calc Af Amer: >60 mL/min (ref >60)
GFR calc Af Amer: >60 mL/min (ref >60)
GFR calc non Af Amer: >60 mL/min (ref >60)
Potassium: 3.5 mEq/L (ref 3.5–5.1)
Sodium: 140 mEq/L (ref 135–145)

## 2010-04-09 LAB — POCT I-STAT 7, (LYTES, BLD GAS, ICA,H+H)
Acid-base deficit: 10 mmol/L — ABNORMAL HIGH (ref 0.0–2.0)
Acid-base deficit: 4 mmol/L — ABNORMAL HIGH (ref 0.0–2.0)
Bicarbonate: 16.3 mEq/L — ABNORMAL LOW (ref 20.0–24.0)
Bicarbonate: 19.7 meq/L — ABNORMAL LOW (ref 20.0–24.0)
Calcium, Ion: 1.15 mmol/L (ref 1.12–1.32)
Calcium, Ion: 1.3 mmol/L (ref 1.12–1.32)
HCT: 32 % — ABNORMAL LOW (ref 36.0–46.0)
HCT: 36 % (ref 36.0–46.0)
Hemoglobin: 10.9 g/dL — ABNORMAL LOW (ref 12.0–15.0)
Hemoglobin: 12.2 g/dL (ref 12.0–15.0)
O2 Saturation: 100 %
O2 Saturation: 100 %
Patient temperature: 36.9
Potassium: 3.7 meq/L (ref 3.5–5.1)
Potassium: 4.3 meq/L (ref 3.5–5.1)
Sodium: 139 mEq/L (ref 135–145)
Sodium: 144 meq/L (ref 135–145)
TCO2: 17 mmol/L (ref 0–100)
TCO2: 21 mmol/L (ref 0–100)
pCO2 arterial: 30.8 mmHg — ABNORMAL LOW (ref 35.0–45.0)
pCO2 arterial: 37.5 mmHg (ref 35.0–45.0)
pH, Arterial: 7.247 — ABNORMAL LOW (ref 7.350–7.400)
pH, Arterial: 7.414 — ABNORMAL HIGH (ref 7.350–7.400)
pO2, Arterial: 284 mmHg — ABNORMAL HIGH (ref 80.0–100.0)
pO2, Arterial: 288 mmHg — ABNORMAL HIGH (ref 80.0–100.0)

## 2010-04-09 LAB — DIFFERENTIAL
Band Neutrophils: 36 % — ABNORMAL HIGH (ref 0–10)
Basophils Absolute: 0 10*3/uL (ref 0.0–0.1)
Basophils Absolute: 0 10*3/uL (ref 0.0–0.1)
Basophils Absolute: 0 10*3/uL (ref 0.0–0.1)
Basophils Absolute: 0 K/uL (ref 0.0–0.1)
Basophils Relative: 0 % (ref 0–1)
Basophils Relative: 0 % (ref 0–1)
Basophils Relative: 0 % (ref 0–1)
Blasts: 0 %
Eosinophils Absolute: 0 10*3/uL (ref 0.0–0.7)
Eosinophils Absolute: 0 10*3/uL (ref 0.0–0.7)
Eosinophils Absolute: 0 K/uL (ref 0.0–0.7)
Eosinophils Absolute: 0.1 10*3/uL (ref 0.0–0.7)
Eosinophils Relative: 0 % (ref 0–5)
Lymphocytes Relative: 14 % (ref 12–46)
Lymphocytes Relative: 8 % — ABNORMAL LOW (ref 12–46)
Lymphocytes Relative: 9 % — ABNORMAL LOW (ref 12–46)
Lymphs Abs: 0.6 10*3/uL — ABNORMAL LOW (ref 0.7–4.0)
Lymphs Abs: 1.9 K/uL (ref 0.7–4.0)
Metamyelocytes Relative: 2 %
Monocytes Absolute: 0.1 10*3/uL (ref 0.1–1.0)
Monocytes Absolute: 0.7 K/uL (ref 0.1–1.0)
Monocytes Relative: 1 % — ABNORMAL LOW (ref 3–12)
Monocytes Relative: 5 % (ref 3–12)
Myelocytes: 0 %
Neutro Abs: 11 K/uL — ABNORMAL HIGH (ref 1.7–7.7)
Neutro Abs: 8.7 10*3/uL — ABNORMAL HIGH (ref 1.7–7.7)
Neutrophils Relative %: 43 % (ref 43–77)
Neutrophils Relative %: 82 % — ABNORMAL HIGH (ref 43–77)
Neutrophils Relative %: 90 % — ABNORMAL HIGH (ref 43–77)
Promyelocytes Absolute: 0 %
nRBC: 0 /100 WBC

## 2010-04-09 LAB — PREPARE FRESH FROZEN PLASMA

## 2010-04-09 LAB — POCT I-STAT 3, ART BLOOD GAS (G3+)
Acid-base deficit: 4 mmol/L — ABNORMAL HIGH (ref 0.0–2.0)
Acid-base deficit: 5 mmol/L — ABNORMAL HIGH (ref 0.0–2.0)
Acid-base deficit: 6 mmol/L — ABNORMAL HIGH (ref 0.0–2.0)
Acid-base deficit: 6 mmol/L — ABNORMAL HIGH (ref 0.0–2.0)
Bicarbonate: 17.1 mEq/L — ABNORMAL LOW (ref 20.0–24.0)
Bicarbonate: 18 meq/L — ABNORMAL LOW (ref 20.0–24.0)
Bicarbonate: 19.1 mEq/L — ABNORMAL LOW (ref 20.0–24.0)
Bicarbonate: 19.2 mEq/L — ABNORMAL LOW (ref 20.0–24.0)
O2 Saturation: 100 %
O2 Saturation: 99 %
O2 Saturation: 99 %
O2 Saturation: 99 %
Patient temperature: 101.9
Patient temperature: 97.8
Patient temperature: 98.5
Patient temperature: 99
TCO2: 18 mmol/L (ref 0–100)
TCO2: 19 mmol/L (ref 0–100)
TCO2: 20 mmol/L (ref 0–100)
TCO2: 20 mmol/L (ref 0–100)
pCO2 arterial: 27.6 mmHg — ABNORMAL LOW (ref 35.0–45.0)
pCO2 arterial: 27.6 mmHg — ABNORMAL LOW (ref 35.0–45.0)
pCO2 arterial: 29.8 mmHg — ABNORMAL LOW (ref 35.0–45.0)
pCO2 arterial: 36.2 mmHg (ref 35.0–45.0)
pH, Arterial: 7.329 — ABNORMAL LOW (ref 7.350–7.400)
pH, Arterial: 7.407 — ABNORMAL HIGH (ref 7.350–7.400)
pH, Arterial: 7.419 — ABNORMAL HIGH (ref 7.350–7.400)
pH, Arterial: 7.422 — ABNORMAL HIGH (ref 7.350–7.400)
pO2, Arterial: 125 mmHg — ABNORMAL HIGH (ref 80.0–100.0)
pO2, Arterial: 144 mmHg — ABNORMAL HIGH (ref 80.0–100.0)
pO2, Arterial: 154 mmHg — ABNORMAL HIGH (ref 80.0–100.0)
pO2, Arterial: 367 mmHg — ABNORMAL HIGH (ref 80.0–100.0)

## 2010-04-09 LAB — BLOOD GAS, ARTERIAL
Acid-base deficit: 3.1 mmol/L — ABNORMAL HIGH (ref 0.0–2.0)
FIO2: 0.4 %
MECHVT: 360 mL
O2 Saturation: 99.1 %
Patient temperature: 98.6
RATE: 12 resp/min
pCO2 arterial: 32.6 mmHg — ABNORMAL LOW (ref 35.0–45.0)

## 2010-04-09 LAB — CBC
HCT: 27.8 % — ABNORMAL LOW (ref 36.0–46.0)
HCT: 33.3 % — ABNORMAL LOW (ref 36.0–46.0)
HCT: 53.8 % — ABNORMAL HIGH (ref 36.0–46.0)
HCT: 55.8 % — ABNORMAL HIGH (ref 36.0–46.0)
Hemoglobin: 11.7 g/dL — ABNORMAL LOW (ref 12.0–15.0)
Hemoglobin: 18 g/dL — ABNORMAL HIGH (ref 12.0–15.0)
Hemoglobin: 18.6 g/dL — ABNORMAL HIGH (ref 12.0–15.0)
MCHC: 33.2 g/dL (ref 30.0–36.0)
MCHC: 33.3 g/dL (ref 30.0–36.0)
MCHC: 33.6 g/dL (ref 30.0–36.0)
MCHC: 33.7 g/dL (ref 30.0–36.0)
MCHC: 34 g/dL (ref 30.0–36.0)
MCV: 86.9 fL (ref 78.0–100.0)
MCV: 87.4 fL (ref 78.0–100.0)
MCV: 87.8 fL (ref 78.0–100.0)
MCV: 87.8 fL (ref 78.0–100.0)
Platelets: 139 10*3/uL — ABNORMAL LOW (ref 150–400)
Platelets: 203 10*3/uL (ref 150–400)
Platelets: 261 10*3/uL (ref 150–400)
Platelets: 74 10*3/uL — ABNORMAL LOW (ref 150–400)
Platelets: 99 10*3/uL — ABNORMAL LOW (ref 150–400)
RBC: 3.14 MIL/uL — ABNORMAL LOW (ref 3.87–5.11)
RBC: 3.83 MIL/uL — ABNORMAL LOW (ref 3.87–5.11)
RBC: 3.95 MIL/uL (ref 3.87–5.11)
RBC: 5.21 MIL/uL — ABNORMAL HIGH (ref 3.87–5.11)
RBC: 6.36 MIL/uL — ABNORMAL HIGH (ref 3.87–5.11)
RDW: 14.5 % (ref 11.5–15.5)
RDW: 15.4 % (ref 11.5–15.5)
RDW: 15.6 % — ABNORMAL HIGH (ref 11.5–15.5)
WBC: 13.6 10*3/uL — ABNORMAL HIGH (ref 4.0–10.5)
WBC: 7 10*3/uL (ref 4.0–10.5)
WBC: 8 10*3/uL (ref 4.0–10.5)
WBC: 9.7 10*3/uL (ref 4.0–10.5)

## 2010-04-09 LAB — TYPE AND SCREEN: ABO/RH(D): O NEG

## 2010-04-09 LAB — CULTURE, BLOOD (ROUTINE X 2)

## 2010-04-09 LAB — POCT I-STAT 3, VENOUS BLOOD GAS (G3P V)
Acid-base deficit: 13 mmol/L — ABNORMAL HIGH (ref 0.0–2.0)
Bicarbonate: 13.1 meq/L — ABNORMAL LOW (ref 20.0–24.0)
O2 Saturation: 64 %
TCO2: 14 mmol/L (ref 0–100)
pCO2, Ven: 31.9 mmHg — ABNORMAL LOW (ref 45.0–50.0)
pH, Ven: 7.22 — ABNORMAL LOW (ref 7.250–7.300)
pO2, Ven: 39 mmHg (ref 30.0–45.0)

## 2010-04-09 LAB — ABO/RH: ABO/RH(D): O NEG

## 2010-04-09 LAB — PROTIME-INR
INR: 1.79 — ABNORMAL HIGH (ref 0.00–1.49)
Prothrombin Time: 20.6 seconds — ABNORMAL HIGH (ref 11.6–15.2)

## 2010-04-09 LAB — CARDIAC PANEL(CRET KIN+CKTOT+MB+TROPI): CK, MB: 11.3 ng/mL (ref 0.3–4.0)

## 2010-04-09 LAB — BODY FLUID CULTURE

## 2010-04-09 LAB — LACTIC ACID, PLASMA
Lactic Acid, Venous: 11 mmol/L — ABNORMAL HIGH (ref 0.5–2.2)
Lactic Acid, Venous: 4.6 mmol/L — ABNORMAL HIGH (ref 0.5–2.2)
Lactic Acid, Venous: 5 mmol/L — ABNORMAL HIGH (ref 0.5–2.2)

## 2010-04-09 LAB — PREALBUMIN: Prealbumin: 4.2 mg/dL — ABNORMAL LOW (ref 18.0–45.0)

## 2010-04-09 LAB — ANAEROBIC CULTURE

## 2010-04-09 LAB — PHOSPHORUS: Phosphorus: 1.8 mg/dL — ABNORMAL LOW (ref 2.3–4.6)

## 2010-04-09 LAB — COMPREHENSIVE METABOLIC PANEL WITH GFR
ALT: <8 U/L (ref 0–35)
Albumin: 3.2 g/dL — ABNORMAL LOW (ref 3.5–5.2)
Alkaline Phosphatase: 76 U/L (ref 39–117)
BUN: 52 mg/dL — ABNORMAL HIGH (ref 6–23)
Calcium: 9.1 mg/dL (ref 8.4–10.5)
Potassium: 5.2 meq/L — ABNORMAL HIGH (ref 3.5–5.1)
Sodium: 130 meq/L — ABNORMAL LOW (ref 135–145)
Total Protein: 7.5 g/dL (ref 6.0–8.3)

## 2010-04-09 LAB — TROPONIN I
Troponin I: 0.11 ng/mL — ABNORMAL HIGH (ref 0.00–0.06)
Troponin I: 0.21 ng/mL — ABNORMAL HIGH (ref 0.00–0.06)

## 2010-04-09 LAB — PREPARE PLATELETS

## 2010-04-09 LAB — POCT I-STAT GLUCOSE
Glucose, Bld: 134 mg/dL — ABNORMAL HIGH (ref 70–99)
Glucose, Bld: 188 mg/dL — ABNORMAL HIGH (ref 70–99)
Operator id: 146111
Operator id: 207831

## 2010-04-09 LAB — PROCALCITONIN: Procalcitonin: 32.86 ng/mL

## 2010-04-09 LAB — MAGNESIUM: Magnesium: 1.7 mg/dL (ref 1.5–2.5)

## 2010-04-09 LAB — KETONES, QUALITATIVE: Acetone, Bld: NEGATIVE

## 2010-04-09 LAB — CHOLESTEROL, TOTAL: Cholesterol: 76 mg/dL (ref 0–200)

## 2010-04-09 LAB — LIPASE, BLOOD: Lipase: 22 U/L (ref 11–59)

## 2010-04-09 LAB — D-DIMER, QUANTITATIVE: D-Dimer, Quant: 2.62 ug/mL-FEU — ABNORMAL HIGH (ref 0.00–0.48)

## 2010-04-27 LAB — CBC
HCT: 48.7 % — ABNORMAL HIGH (ref 36.0–46.0)
Hemoglobin: 12.1 g/dL (ref 12.0–15.0)
Hemoglobin: 15.6 g/dL — ABNORMAL HIGH (ref 12.0–15.0)
Hemoglobin: 16.5 g/dL — ABNORMAL HIGH (ref 12.0–15.0)
MCHC: 33.8 g/dL (ref 30.0–36.0)
MCHC: 33.9 g/dL (ref 30.0–36.0)
MCV: 89.3 fL (ref 78.0–100.0)
Platelets: 160 10*3/uL (ref 150–400)
Platelets: 249 10*3/uL (ref 150–400)
RBC: 3.98 MIL/uL (ref 3.87–5.11)
RBC: 4.68 MIL/uL (ref 3.87–5.11)
RBC: 5.16 MIL/uL — ABNORMAL HIGH (ref 3.87–5.11)
RDW: 15.1 % (ref 11.5–15.5)
RDW: 15.6 % — ABNORMAL HIGH (ref 11.5–15.5)
WBC: 6.4 10*3/uL (ref 4.0–10.5)
WBC: 8.5 10*3/uL (ref 4.0–10.5)

## 2010-04-27 LAB — BASIC METABOLIC PANEL
BUN: 13 mg/dL (ref 6–23)
BUN: 27 mg/dL — ABNORMAL HIGH (ref 6–23)
CO2: 19 mEq/L (ref 19–32)
CO2: 20 mEq/L (ref 19–32)
CO2: 26 mEq/L (ref 19–32)
Calcium: 8 mg/dL — ABNORMAL LOW (ref 8.4–10.5)
Calcium: 8.6 mg/dL (ref 8.4–10.5)
Chloride: 105 mEq/L (ref 96–112)
Chloride: 108 mEq/L (ref 96–112)
Creatinine, Ser: 0.53 mg/dL (ref 0.4–1.2)
Creatinine, Ser: 1.57 mg/dL — ABNORMAL HIGH (ref 0.4–1.2)
GFR calc Af Amer: 38 mL/min — ABNORMAL LOW (ref >60)
GFR calc Af Amer: >60 mL/min (ref >60)
GFR calc non Af Amer: >60 mL/min (ref >60)
Potassium: 3.3 mEq/L — ABNORMAL LOW (ref 3.5–5.1)
Sodium: 137 mEq/L (ref 135–145)

## 2010-04-27 LAB — COMPREHENSIVE METABOLIC PANEL
ALT: 15 U/L (ref 0–35)
ALT: 15 U/L (ref 0–35)
AST: 19 U/L (ref 0–37)
AST: 30 U/L (ref 0–37)
Albumin: 1.9 g/dL — ABNORMAL LOW (ref 3.5–5.2)
Alkaline Phosphatase: 65 U/L (ref 39–117)
Alkaline Phosphatase: 69 U/L (ref 39–117)
Alkaline Phosphatase: 70 U/L (ref 39–117)
BUN: 25 mg/dL — ABNORMAL HIGH (ref 6–23)
CO2: 17 mEq/L — ABNORMAL LOW (ref 19–32)
CO2: 21 mEq/L (ref 19–32)
Calcium: 9 mg/dL (ref 8.4–10.5)
Chloride: 104 mEq/L (ref 96–112)
Chloride: 109 mEq/L (ref 96–112)
Creatinine, Ser: 0.83 mg/dL (ref 0.4–1.2)
GFR calc Af Amer: 19 mL/min — ABNORMAL LOW (ref >60)
GFR calc Af Amer: >60 mL/min (ref >60)
GFR calc non Af Amer: 16 mL/min — ABNORMAL LOW (ref >60)
GFR calc non Af Amer: 16 mL/min — ABNORMAL LOW (ref >60)
GFR calc non Af Amer: >60 mL/min (ref >60)
Glucose, Bld: 205 mg/dL — ABNORMAL HIGH (ref 70–99)
Glucose, Bld: 287 mg/dL — ABNORMAL HIGH (ref 70–99)
Potassium: 3.7 mEq/L (ref 3.5–5.1)
Potassium: 5.3 mEq/L — ABNORMAL HIGH (ref 3.5–5.1)
Potassium: 5.6 mEq/L — ABNORMAL HIGH (ref 3.5–5.1)
Sodium: 135 mEq/L (ref 135–145)
Sodium: 135 mEq/L (ref 135–145)
Total Bilirubin: 0.9 mg/dL (ref 0.3–1.2)
Total Protein: 6.6 g/dL (ref 6.0–8.3)

## 2010-04-27 LAB — DIFFERENTIAL
Basophils Relative: 0 % (ref 0–1)
Basophils Relative: 0 % (ref 0–1)
Eosinophils Absolute: 0 10*3/uL (ref 0.0–0.7)
Eosinophils Relative: 0 % (ref 0–5)
Monocytes Relative: 1 % — ABNORMAL LOW (ref 3–12)
Neutro Abs: 6.5 10*3/uL (ref 1.7–7.7)
Neutrophils Relative %: 89 % — ABNORMAL HIGH (ref 43–77)
Neutrophils Relative %: 89 % — ABNORMAL HIGH (ref 43–77)

## 2010-04-27 LAB — LACTIC ACID, PLASMA: Lactic Acid, Venous: 7.5 mmol/L — ABNORMAL HIGH (ref 0.5–2.2)

## 2010-04-27 LAB — GLUCOSE, CAPILLARY
Glucose-Capillary: 132 mg/dL — ABNORMAL HIGH (ref 70–99)
Glucose-Capillary: 134 mg/dL — ABNORMAL HIGH (ref 70–99)
Glucose-Capillary: 142 mg/dL — ABNORMAL HIGH (ref 70–99)
Glucose-Capillary: 150 mg/dL — ABNORMAL HIGH (ref 70–99)
Glucose-Capillary: 172 mg/dL — ABNORMAL HIGH (ref 70–99)
Glucose-Capillary: 177 mg/dL — ABNORMAL HIGH (ref 70–99)
Glucose-Capillary: 178 mg/dL — ABNORMAL HIGH (ref 70–99)
Glucose-Capillary: 188 mg/dL — ABNORMAL HIGH (ref 70–99)
Glucose-Capillary: 190 mg/dL — ABNORMAL HIGH (ref 70–99)
Glucose-Capillary: 196 mg/dL — ABNORMAL HIGH (ref 70–99)
Glucose-Capillary: 205 mg/dL — ABNORMAL HIGH (ref 70–99)
Glucose-Capillary: 228 mg/dL — ABNORMAL HIGH (ref 70–99)
Glucose-Capillary: 252 mg/dL — ABNORMAL HIGH (ref 70–99)

## 2010-04-27 LAB — POCT CARDIAC MARKERS: Myoglobin, poc: 142 ng/mL (ref 12–200)

## 2010-04-27 LAB — URINALYSIS, ROUTINE W REFLEX MICROSCOPIC
Bilirubin Urine: NEGATIVE
Glucose, UA: NEGATIVE mg/dL
Ketones, ur: NEGATIVE mg/dL
Nitrite: POSITIVE — AB
Protein, ur: 100 mg/dL — AB

## 2010-04-27 LAB — POCT I-STAT, CHEM 8
Calcium, Ion: 1.05 mmol/L — ABNORMAL LOW (ref 1.12–1.32)
Creatinine, Ser: 2.7 mg/dL — ABNORMAL HIGH (ref 0.4–1.2)
Glucose, Bld: 295 mg/dL — ABNORMAL HIGH (ref 70–99)
Hemoglobin: 17.7 g/dL — ABNORMAL HIGH (ref 12.0–15.0)
Sodium: 136 mEq/L (ref 135–145)
TCO2: 15 mmol/L (ref 0–100)

## 2010-04-27 LAB — CULTURE, BLOOD (ROUTINE X 2)
Culture: NO GROWTH
Culture: NO GROWTH
Culture: NO GROWTH

## 2010-04-27 LAB — URINE CULTURE
Colony Count: >=100000
Special Requests: NEGATIVE

## 2010-04-27 LAB — HEMOGLOBIN A1C
Hgb A1c MFr Bld: 8.1 % — ABNORMAL HIGH (ref 4.6–6.1)
Mean Plasma Glucose: 186 mg/dL

## 2010-04-27 LAB — URINE MICROSCOPIC-ADD ON

## 2010-05-02 LAB — POCT I-STAT 3, ART BLOOD GAS (G3+)
Bicarbonate: 27.2 mEq/L — ABNORMAL HIGH (ref 20.0–24.0)
O2 Saturation: 98 %
TCO2: 28 mmol/L (ref 0–100)
pCO2 arterial: 38.3 mmHg (ref 35.0–45.0)
pH, Arterial: 7.458 — ABNORMAL HIGH (ref 7.350–7.400)

## 2010-05-02 LAB — CBC
HCT: 41.8 % (ref 36.0–46.0)
MCHC: 34.2 g/dL (ref 30.0–36.0)
MCV: 90.3 fL (ref 78.0–100.0)
Platelets: 174 10*3/uL (ref 150–400)
RBC: 4.63 MIL/uL (ref 3.87–5.11)
WBC: 9.9 10*3/uL (ref 4.0–10.5)

## 2010-05-02 LAB — COMPREHENSIVE METABOLIC PANEL
ALT: 17 U/L (ref 0–35)
AST: 18 U/L (ref 0–37)
Alkaline Phosphatase: 73 U/L (ref 39–117)
CO2: 28 mEq/L (ref 19–32)
Calcium: 8.9 mg/dL (ref 8.4–10.5)
Chloride: 100 mEq/L (ref 96–112)
GFR calc Af Amer: >60 mL/min (ref >60)
GFR calc non Af Amer: >60 mL/min (ref >60)
Glucose, Bld: 181 mg/dL — ABNORMAL HIGH (ref 70–99)
Potassium: 3.6 mEq/L (ref 3.5–5.1)
Sodium: 138 mEq/L (ref 135–145)
Total Bilirubin: 0.8 mg/dL (ref 0.3–1.2)

## 2010-05-02 LAB — GLUCOSE, CAPILLARY
Glucose-Capillary: 231 mg/dL — ABNORMAL HIGH (ref 70–99)
Glucose-Capillary: 251 mg/dL — ABNORMAL HIGH (ref 70–99)
Glucose-Capillary: 297 mg/dL — ABNORMAL HIGH (ref 70–99)
Glucose-Capillary: 378 mg/dL — ABNORMAL HIGH (ref 70–99)
Glucose-Capillary: 398 mg/dL — ABNORMAL HIGH (ref 70–99)
Glucose-Capillary: 399 mg/dL — ABNORMAL HIGH (ref 70–99)
Glucose-Capillary: 416 mg/dL — ABNORMAL HIGH (ref 70–99)

## 2010-05-02 LAB — DIFFERENTIAL
Basophils Relative: 0 % (ref 0–1)
Eosinophils Absolute: 0.7 10*3/uL (ref 0.0–0.7)
Eosinophils Relative: 7 % — ABNORMAL HIGH (ref 0–5)
Lymphs Abs: 2.6 10*3/uL (ref 0.7–4.0)
Monocytes Relative: 8 % (ref 3–12)
Neutrophils Relative %: 58 % (ref 43–77)

## 2010-05-02 LAB — MAGNESIUM: Magnesium: 1.6 mg/dL (ref 1.5–2.5)

## 2010-05-07 LAB — GLUCOSE, CAPILLARY
Glucose-Capillary: 153 mg/dL — ABNORMAL HIGH (ref 70–99)
Glucose-Capillary: 155 mg/dL — ABNORMAL HIGH (ref 70–99)
Glucose-Capillary: 160 mg/dL — ABNORMAL HIGH (ref 70–99)
Glucose-Capillary: 160 mg/dL — ABNORMAL HIGH (ref 70–99)
Glucose-Capillary: 160 mg/dL — ABNORMAL HIGH (ref 70–99)
Glucose-Capillary: 164 mg/dL — ABNORMAL HIGH (ref 70–99)
Glucose-Capillary: 170 mg/dL — ABNORMAL HIGH (ref 70–99)
Glucose-Capillary: 174 mg/dL — ABNORMAL HIGH (ref 70–99)
Glucose-Capillary: 190 mg/dL — ABNORMAL HIGH (ref 70–99)
Glucose-Capillary: 192 mg/dL — ABNORMAL HIGH (ref 70–99)
Glucose-Capillary: 194 mg/dL — ABNORMAL HIGH (ref 70–99)
Glucose-Capillary: 197 mg/dL — ABNORMAL HIGH (ref 70–99)
Glucose-Capillary: 233 mg/dL — ABNORMAL HIGH (ref 70–99)

## 2010-05-07 LAB — COMPREHENSIVE METABOLIC PANEL
BUN: 16 mg/dL (ref 6–23)
CO2: 24 mEq/L (ref 19–32)
Calcium: 8.7 mg/dL (ref 8.4–10.5)
Chloride: 102 mEq/L (ref 96–112)
Creatinine, Ser: 0.76 mg/dL (ref 0.4–1.2)
GFR calc Af Amer: >60 mL/min (ref >60)
GFR calc non Af Amer: >60 mL/min (ref >60)
Total Bilirubin: 0.7 mg/dL (ref 0.3–1.2)

## 2010-05-07 LAB — DIFFERENTIAL
Basophils Relative: 0 % (ref 0–1)
Eosinophils Absolute: 0.1 10*3/uL (ref 0.0–0.7)
Neutrophils Relative %: 57 % (ref 43–77)

## 2010-05-07 LAB — CBC
Hemoglobin: 14.5 g/dL (ref 12.0–15.0)
MCHC: 33.9 g/dL (ref 30.0–36.0)
MCHC: 34.2 g/dL (ref 30.0–36.0)
MCV: 91 fL (ref 78.0–100.0)
Platelets: 180 10*3/uL (ref 150–400)
RBC: 4.66 MIL/uL (ref 3.87–5.11)
WBC: 5.5 10*3/uL (ref 4.0–10.5)

## 2010-05-07 LAB — PROTEIN / CREATININE RATIO, URINE
Creatinine, Urine: <10 mg/dL
Total Protein, Urine: <6 mg/dL

## 2010-05-07 LAB — CULTURE, BLOOD (ROUTINE X 2): Culture: NO GROWTH

## 2010-05-07 LAB — C-REACTIVE PROTEIN: CRP: 1.1 mg/dL — ABNORMAL HIGH (ref <0.6)

## 2010-05-07 LAB — URINALYSIS, ROUTINE W REFLEX MICROSCOPIC
Bilirubin Urine: NEGATIVE
Glucose, UA: NEGATIVE mg/dL
Ketones, ur: NEGATIVE mg/dL
Protein, ur: NEGATIVE mg/dL
pH: 7 (ref 5.0–8.0)

## 2010-05-07 LAB — BASIC METABOLIC PANEL
BUN: 14 mg/dL (ref 6–23)
CO2: 25 mEq/L (ref 19–32)
Calcium: 9 mg/dL (ref 8.4–10.5)
Calcium: 9.1 mg/dL (ref 8.4–10.5)
Chloride: 107 mEq/L (ref 96–112)
Creatinine, Ser: 0.68 mg/dL (ref 0.4–1.2)
Creatinine, Ser: 0.72 mg/dL (ref 0.4–1.2)
GFR calc Af Amer: >60 mL/min (ref >60)
GFR calc non Af Amer: >60 mL/min (ref >60)
Sodium: 140 mEq/L (ref 135–145)

## 2010-05-07 LAB — SEDIMENTATION RATE: Sed Rate: 7 mm/hr (ref 0–22)

## 2010-05-07 LAB — TSH: TSH: 2.49 u[IU]/mL (ref 0.350–4.500)

## 2010-05-07 LAB — BRAIN NATRIURETIC PEPTIDE: Pro B Natriuretic peptide (BNP): <30 pg/mL (ref 0.0–100.0)

## 2010-06-05 NOTE — H&P (Signed)
Shelia Webb, SWARM NO.:  [id number]   MEDICAL RECORD NO.:  [id number]          PATIENT TYPE:  INP   LOCATION:  3006                         FACILITY:  [facility]   PHYSICIAN:  Vania Rea, M.D. DATE OF BIRTH:  11-Dec-1921   DATE OF ADMISSION:  04/26/2008  DATE OF DISCHARGE:                              HISTORY & PHYSICAL   PRIMARY CARE PHYSICIAN:  Dr. Baltazar Najjar at [facility].   CHIEF COMPLAINT:  Persistent cough for 3 weeks.   HISTORY OF PRESENT ILLNESS:  This is an 77 year old Caucasian lady with  a history of GERD, hypertension and diabetes who was discharged to  skilled living facility in January of this year for gait rehabilitation  after treatment for cellulitis.  She reports that she has not been able  to walk for some time now but has been having a persistent cough for the  past 3 weeks and for the past 2 days has become increasingly hoarse.  She has been receiving Zithromax.  We do not have the full medication  administration record, only a medication list, so we are not quite sure  exactly what she has been receiving over the past 3 weeks.  However,  currently she has been receiving Zithromax and is not improving.  There  is no record of a fever.  Currently, the patient is complaining of being  very short of breath, intractable cough and hoarseness.  There is also a  history of bilious vomiting 3 times overnight.   PAST MEDICAL HISTORY:  1. Anxiety and depression.  2. Diabetes.  3. Hypertension.  4. Coronary artery disease.  5. Cellulitis of the lower extremity.  6. Nephrolithiasis.  7. Osteoporosis.  8. Obesity.   MEDICATIONS:  1. Amitriptyline 20 mg apparently twice daily.  2. Imdur 30 mg daily.  3. Toprol-XL 100 mg daily.  4. Novolin 70/30 18 units each morning, 14 units each evening and Accu-      Cheks q.a.c. and q.h.s. with no sliding scale.  5. Robitussin DM 10 mL every 6 hours p.r.n. for cough.  6.  Senokot 8.6/50 two tablets every night.  7. Systane ophthalmic solution 2 drops to each eye 3 times daily      p.r.n. for dry or irritated eyes.  8. Tessalon Perles 100 mg 3 times daily p.r.n. for cough.  9. Vitamin C 500 mg daily.  10.Aspirin 81 mg each evening.  11.Meloxicam 15 mg daily.  12.Vicodin 5/500 every 8 hours p.r.n.  13.Zithromax 500 mg daily started on April 5.  14.Fleet's enema p.r.n. for constipation.  15.Flonase nasal spray p.r.n., 2 sprays to each nostril daily.  16.MiraLax 17 g p.o. daily p.r.n.  17.Mucinex 600 mg every 12 hours, started on April 6.  18.Multivitamins 1 daily.  19.Omeprazole 20 mg daily.  20.Requip 0.25 mg twice daily.  21.The patient was known to have been taking Plavix, but this      medication is not seen on this medication reconciliation.   ALLERGIES:  To PENICILLIN.   SOCIAL HISTORY:  The patient is a widow.  She apparently has a son in  [place].  There is no history of tobacco or alcohol use.  Currently a  resident of the [address] [facility].   FAMILY HISTORY:  Is significant for heart disease in her mother and  father and a son with heart disease.   REVIEW OF SYSTEMS:  Other than noted above, unable to obtain much since  the patient is coughing so much and also very hoarse.   PHYSICAL EXAMINATION:  Obese elderly Caucasian lady lying in the  stretcher, coughing frequently, speaking hoarsely between coughing  spells.  VITALS:  Temperature is 97.1, pulse 73, respirations 18, blood pressure  147/89.  She is saturating at 96% on 2 liters.  Her pupils are round and equal.  Mucous membranes pink.  Anicteric.  No  cervical lymphadenopathy.  She has a very thick neck.  No jugular venous  distention or thyromegaly noted.  CHEST:  She has diffuse rhonchi and crackles.  CARDIOVASCULAR SYSTEM:  Regular rhythm.  ABDOMEN:  Is obese, soft and nontender.  No masses.  EXTREMITIES:  Without edema.  She has ischemic changes of the  lower  extremity, probably from her previous cellulitis.  Dorsalis pedis pulses  are 1+ bilaterally and equal.  CENTRAL NERVOUS SYSTEM:  Cranial nerves II-XII are grossly intact.  She  has no focal neurologic deficit.   LABORATORY DATA:  Her CBC is reviewed and is remarkably normal; white  count is 9.9, hemoglobin 14.3, platelets 173.  The differential is  normal.  No left shift is described.  ABG:  pH is 7.43, pCO2 38, pO2 91,  saturating at 98%.  FIO2 was not stated.  BNP is 441.  No serum  chemistry has been drawn.  Two-view chest x-ray shows cardiomegaly  without congestive heart failure.  There is either positional technique  or related opacity in the medial right lung base, questionable early  right lower lobe infection.   ASSESSMENT:  1. Acute on chronic bronchitis.  2. Possible right lower lobe pneumonia, less likely.  3. Diabetes type 2, control unknown.  4. History of coronary artery disease.   PLAN:  1. We need to get some serum chemistry on this lady and hemoglobin A1c      to assess her diabetes control and the      state of her renal function.  She sounds as if she needs      nebulizations and will also start on steroids.  Will cover with      antibiotics, but if her renal function is good, will consider      getting a CT scan of her chest.  2. Other plans as per orders.      Vania Rea, M.D.  Electronically Signed     LC/MEDQ  D:  04/27/2008  T:  04/27/2008  Job:  [id number]   cc:   Clerance Lav, MD PhD  Orlene Och, MD  Maxwell Caul, M.D.

## 2010-06-05 NOTE — Discharge Summary (Signed)
Shelia Webb, Shelia Webb                  ACCOUNT NO.:  [id number]   MEDICAL RECORD NO.:  [id number]          PATIENT TYPE:  INP   LOCATION:  1518                         FACILITY:  [facility]   PHYSICIAN:  Barry Dienes. Eloise Harman, M.D.DATE OF BIRTH:  27-Jun-1921   DATE OF ADMISSION:  02/14/2008  DATE OF DISCHARGE:                               DISCHARGE SUMMARY   PERTINENT FINDINGS:  The patient is an 77 year old Caucasian woman with  a history of diabetes mellitus, type 2, coronary artery disease, and  moderately severe bilateral knee osteoarthritis.  Over the past 3 days,  she has had gradually increasing bilateral lower extremity edema.  She  developed blistering of both shins associated with worsening redness and  pain.  She called our office and was started on doxycycline and Vicodin  and a home health nurse evaluation was requested.  She was unable to  make it to an office appointment recently.  Despite these antibiotic  treatments, her pain and redness continued to worsen, so she presented  to the emergency room for evaluation.  She has not had any recent change  in her medicines.  She has no history of congestive heart failure and  denies recent travel or surgery.   PAST MEDICAL HISTORY:  1. Diabetes mellitus, type 2, requiring insulin.  2. Hypertension.  3. Nephrolithiasis.  4. Coronary artery disease with diffuse two-vessel disease on medical      management after cardiac catheterization in April 2001.  5. Gastroesophageal reflux disease.  6. Osteoporosis with no history of fracture.   MEDICATIONS PRIOR TO ADMISSION:  1. Insulin 70/30 eighteen units in a.m. and 14 units in the p.m.  2. Imdur 30 mg daily.  3. Aspirin 81 mg daily.  4. Toprol XL 100 mg daily.  5. Plavix 75 mg daily.  6. Protonix 40 mg daily.  7. Requip 0.25 mg p.o. b.i.d.  8. Amitriptyline 10 mg to take 2 tablets p.o. b.i.d.  9. Meclizine 25 mg p.o. t.i.d. p.r.n. dizziness.  10.Vesicare 5 mg p.o. daily.  11.Systane  eye drops p.r.n.   ALLERGIES:  PENICILLIN.   INITIAL PHYSICAL EXAMINATION:  VITAL SIGNS:  Blood pressure 179/103,  pulse 94, respirations 20, temperature 98.1.  Pulse oxygen saturation  98% on room air.  GENERAL:  She is a mildly overweight white female who is in no apparent  distress, sitting partially upright in bed.  HEAD, EYES, EARS, NOSE AND THROAT:  Within normal limits.  NECK:  Supple without jugulovenous distention or carotid bruit.  CHEST:  Clear to auscultation.  HEART:  Regular rate and rhythm without significant murmur or gallop.  ABDOMEN:  Benign.  EXTREMITIES:  She has bilateral lower extremity pitting edema to the  knees with peau d'orange texture with warmth and erythema below both  knees.  She had valgus deformities of both lower legs.   INITIAL LABORATORY STUDIES:  CBC had a white blood cell count of 5.5,  hemoglobin 14.1, platelets 180.  Serum sodium 139, potassium 3.3,  chloride 107, bicarbonate 25, BUN 14, creatinine 0.7, glucose 185.   HOSPITAL COURSE:  The  patient was admitted to a medical bed without  telemetry.  She was started on IV Levaquin and vancomycin initially.  She had an erythrocyte sedimentation rate done which was 7.  In  addition, she had bilateral lower extremity venous ultrasound exam done  that showed no evidence of DVT or superficial thrombosis.  The study was  limited by her inability to straighten out her legs.  She was followed  closely by physical therapy consultant, who recommended rehab in a  skilled nursing facility due to very poor mobility.  She also had a PICC  line placed on February 17, 2008, in the right basilic vein without  complications.  When asked if she would rather attempt total knee  replacement surgery despite risks of myocardial infarction or death, she  stated that she was not sure.   PROCEDURES:  Bilateral knee x-rays that showed severe osteoarthritis  changes with valgus deformities at the knees, bilateral DVT  ultrasound  exam showing no evidence of DVT and right upper extremity PICC line  placement.   COMPLICATIONS:  None.   CONDITION ON DISCHARGE:  She was somewhat sleepy.  She said that she  still has moderate bilateral knee pain with any movement.  She has not  been able to walk due to the moderate pain.  She has been eating well.  She has a mild bifrontal headache.  She does not have substernal chest  pain, shortness of breath or palpitations.  She did have constipation,  but had a normal bowel movement today.   CURRENT PHYSICAL EXAMINATION:  VITAL SIGNS:  Blood pressure 121/75,  pulse 65, respirations 20, temperature 97.5.  Pulse oxygen saturation  96% on room air.  GENERAL:  She is a mildly overweight white female who was initially  sleep, lying partially upright and then easily awoken.  HEAD, EYES, EARS, NOSE AND THROAT:  Within normal limits.  NECK:  Supple without jugulovenous distention or carotid bruit.  CHEST:  Clear to auscultation.  HEART:  Regular rate and rhythm without significant murmur or gallop.  ABDOMEN:  Normal bowel sounds and no hepatosplenomegaly or tenderness.  EXTREMITIES:  She had bilateral trace ankle edema.  She had bilateral  mild redness, warmth, and hyperpigmentation of the distal legs.  On both  legs, there are crusty areas with very minimal drainage on the  posteromedial aspect.   DISCHARGE DIAGNOSES:  1. Bilateral lower extremity cellulitis, improving with vancomycin.  2. Bilateral venous stasis ulcers.  3. Bilateral lower extremity atherosclerotic peripheral vascular      disease.  4. Severe osteoarthritis of both knees.  5. Gait instability.  6. Physical deconditioning.  7. Coronary artery disease, two-vessel.  8. Hypertension.  9. Gastroesophageal reflux disease.  10.Restless legs.  11.Overactive bladder.  12.Dry eyes.  13.Diabetes mellitus, type 2, controlled.   DISCHARGE MEDICATIONS:  1. Vancomycin 1 gram IV every 12 hours for 7 days.   2. Mobic 15 mg p.o. daily.  3. Vicodin 5/500 take 1 tablet p.o. t.i.d.  4. Vicodin 5/500 take 1 tablet p.o. t.i.d. p.r.n. pain.  5. GlycoLax 17 grams p.o. b.i.d.  6. Senna S 2 tablets p.o. daily.  7. Fleet's enema every other day p.r.n. constipation.  8. Imdur 30 mg p.o. daily.  9. Aspirin 81 mg p.o. daily.  10.Toprol XL 100 mg p.o. daily.  11.Plavix 75 mg p.o. daily.  12.Protonix 40 mg p.o. daily.  13.Requip 0.25 mg p.o. b.i.d.  14.Amitriptyline 10 mg tablets take 2 tablets p.o. b.i.d.  15.Vesicare  5 mg p.o. daily.  16.Systane eye drops 2 drops in each eye t.i.d. p.r.n. dry eyes or      irritation.  17.Novolin R 70/30 insulin take 18 units subcutaneously with breakfast      and 14 units subcutaneously with dinner.   SPECIAL INSTRUCTIONS:  She should have both legs ulcers treated with  Betadine, covered by gauze and held with a Ace wrap once daily.  She  will be transferred to a skilled nursing facility for an attempt at gait  rehabilitation.  We should also see if an appointment can be made with  Dr. Marcene Corning within the next 1-2 weeks to see if she could have  early bilateral knee cortisone injections.  She should also have a  follow-up evaluation with Dr. Jarome Matin at [facility] upon discharge from the skilled nursing facility.           ______________________________  Barry Dienes. Eloise Harman, M.D.     DGP/MEDQ  D:  02/18/2008  T:  02/18/2008  Job:  [id number]   cc:   Colleen Can. Deborah Chalk, M.D.  Fax: [phone]   Lubertha Basque. Jerl Santos, M.D.  Fax: [phone]

## 2010-06-05 NOTE — Discharge Summary (Signed)
NAMECAELEY, DOHRMANN                  ACCOUNT NO.:  [id number]   MEDICAL RECORD NO.:  [id number]          PATIENT TYPE:  INP   LOCATION:  3006                         FACILITY:  [facility]   PHYSICIAN:  Altha Harm, MDDATE OF BIRTH:  1921/08/06   DATE OF ADMISSION:  04/26/2008  DATE OF DISCHARGE:  04/29/2008                               DISCHARGE SUMMARY   DISCHARGE DISPOSITION:  [name].   DISCHARGE DIAGNOSES:  1. Acute bronchitis.  2. Diabetes type 2 with hyperglycemia secondary to steroid use.  3. Hypertension.  4. History of coronary artery disease.  5. History of anxiety and depression.  6. Chronic pain.  7. History of constipation.  8. Osteoporosis.  9. History of nephrolithiasis.  10.Obesity.  11.Laryngitis.   DISCHARGE MEDICATIONS:  Include the following:  1. Amitriptyline 20 mg p.o. b.i.d.  2. Imdur 30 mg p.o. daily.  3. Toprol-XL 100 mg p.o. daily.  4. Novolin 70/30 22 units subcutaneous q.a.m. and 18 units      subcutaneous q.p.m.  5. Senokot 1 tablet p.o. every night.  6. Robitussin DM 10 mL q.6 h. p.r.n. cough.  7. Systane eye drops 2 drops each eye t.i.d. p.r.n.  8. Vitamin C 500 mg p.o. daily.  9. Mobic 15 mg p.o. daily.  10.Aspirin 81 mg p.o. daily.  11.Vicodin 5/500 one tablet p.o. q.8 h. p.r.n.  12.Fleet's enema per rectum p.r.n.  13.Flonase nasal spray 2 puffs daily to each nostril.  14.MiraLax 17 g in 8 ounces of water daily p.r.n.  15.Mucinex 100 mg p.o. b.i.d. p.r.n.  16.Multivitamin 1 tablet p.o. daily.  17.Prilosec 20 mg p.o. daily.  18.Requip 0.25 mg p.o. b.i.d.  19.Avelox 400 mg p.o. daily for 4 days.  20.Prednisone 40 mg p.o. daily x1, then 30 mg p.o. daily x2, then 20      mg p.o. daily x2, then 10 mg p.o. daily x2, then stop.  21.NovoLog sliding scale moderate sensitivity.  22.Chloraseptic spray p.r.n. to the back of the throat.  23.Albuterol MDI 2 puffs p.o. q.4 h. p.r.n. wheezing.   CONSULTANTS:  None.   PROCEDURE:  None.   DIAGNOSTIC STUDIES:  Chest x-ray two-view done on admission which shows:  1. Cardiomegaly without congestive heart failure features.  2. Depressed sensitivity and specificity of exam due to technique-      related factors.  3. Likely positional technique related opacity in the medial right      lung base.  All said, considerations would include atelectasis or      less likely early right lower lobe infection.   ALLERGIES:  PENICILLIN.   CODE STATUS:  Full code.   PRIMARY CARE PHYSICIAN:  Dr. Baltazar Najjar at [facility].   CHIEF COMPLAINT:  Persistent cough of 3 weeks.   HISTORY OF PRESENT ILLNESS:  Please refer to the history and physical  dictated by Dr. Orvan Falconer for details of the HPI.   HOSPITAL COURSE:  1. The patient was admitted with acute bronchitis.  She was started on      IV steroids  and beta agonist.  The patient was also started on      Mucinex for expectoration.  The patient did very well within the      first 24-48 hours on IV steroids.  Her cough improved considerably      as did her hypoxemia.  The patient was weaned off of oxygen, and      she was transitioned to p.o. prednisone.  The patient at this point      is on a tapering dose of prednisone and has not required any      nebulization treatment for the past 24 hours.  She was without any      need for oxygen and has no increased work of breathing or      conversation dyspnea at this time.  2. Diabetes type 2.  The patient was known to have diabetes type 2 and      has been on NovoLog 70/30 prior to coming to the hospital.  Here in      the hospital, she was treated with Lantus and NovoLog.  The      patient's hemoglobin A1c was marginally elevated at 7.2.  The      patient has been having some hyperglycemia secondary to the use of      steroids.  The patient has been placed on a higher dose of 70/30      for discharge.  I would recommend that the patient  have blood      sugars checked a.c. and h.s. and a moderate resistant sliding scale      of NovoLog be instituted for her.  As the patient weans off of her      prednisone, I expect that her blood sugars will begin to decrease,      and she needs to be watched closely for further titration of her      insulin as her blood sugars decreased from the weaning of      prednisone.  I have instructed the patient on signs and symptoms of      hypoglycemia and have instructed her to report to the nurses or      nursing home personnel if she has any of these symptoms,      particularly as she is weaned off of the prednisone.  3. Hypertension.  This was well controlled while hospitalized.  4. Laryngitis.  The patient has laryngitis which is improving.  She      has been using Chloraseptic spray for symptom relief and has doing      well.   Otherwise, the patient has been essentially stable with her chronic  problems including her coronary artery disease.  She is being discharged  back to the nursing home mostly on her prehospital medications.  I would  recommend that the patient follows up with her supervising physician  within 72 hours of returning to the nursing home.  No recommendations  for lab at this time.  Dietary restriction is for a heart-healthy, carb-  modified diet.  The patient was  seen by both physical therapy and occupational therapy while here in the  hospital, and their recommendations are for continued PT and OT when she  returns to skilled facility.   Total time of this discharge 40 minutes.      Altha Harm, MD  Electronically Signed     MAM/MEDQ  D:  04/29/2008  T:  04/29/2008  Job:  [id number]   cc:  Maxwell Caul, M.D.

## 2010-06-05 NOTE — H&P (Signed)
NAMELORRAYNE, Shelia Webb NO.:  [id number]   MEDICAL RECORD NO.:  [id number]          PATIENT TYPE:  EMS   LOCATION:  ED                           FACILITY:  [facility]   PHYSICIAN:  Kari Baars, M.D.  DATE OF BIRTH:  Oct 16, 1921   DATE OF ADMISSION:  02/14/2008  DATE OF DISCHARGE:                              HISTORY & PHYSICAL   CHIEF COMPLAINT:  Bilateral leg pain and redness.   HISTORY OF PRESENT ILLNESS:  Ms. Parks is an 77 year old white female  with a history of type 2 diabetes, insulin-dependent, coronary artery  disease who presented to the emergency department with a complaint of  leg pain and redness.  The patient reports a gradual increase in lower  extremity edema over the past 3 days.  About 5 days ago she developed a  blister on both of her shins associated with redness and pain.  She  called the office and was treated with doxycycline and Vicodin and home  health was ordered.  Despite these antibiotic treatments, her pain and  redness and swelling continued to worsen and she presented to the  emergency department this evening where her exam was consistent with  bilateral lower extremity cellulitis.  She denies any recent change in  medications.  She has not had any significant increase in sodium intake.  She denies any prior history of congestive heart failure.  Denies  orthopnea or chest pain.  She has had increased dyspnea on exertion  recently.  Denies any recent travel or surgery.   REVIEW OF SYSTEMS:  All systems reviewed with the patient and are  negative except in the HPI.  Her sugars have been well controlled at  less than 130 recently.  No significant change.   PAST MEDICAL HISTORY:  1. Type 2 diabetes, insulin dependent.  2. Hypertension.  3. Nephrolithiasis.  4. Coronary artery disease with heart catheterization in 04/1999      showing diffuse 2-vessel disease treated medically.  5. Gastroesophageal reflux disease.  6. Osteoporosis with  no history of fracture.   CURRENT MEDICATIONS:  1. Insulin 70/30, 18 units in the morning and 14 units the evening.  2. Imdur 30 mg daily.  3. Aspirin 81 mg daily.  4. Metoprolol tartrate 100 mg daily.  5. Plavix 75 mg daily.  6. Protonix 40 mg daily.  7. Requip 0.25 mg b.i.d.  8. Amitriptyline 10 mg 2 b.i.d.  9. Meclizine 25 mg p.r.n.  10.Doxycycline 100 mg b.i.d. started 3 days ago.  11.Vesicare 5 mg daily.  12.Systane.   ALLERGIES:  PENICILLIN causes a significant rash.   SOCIAL HISTORY:  She is a widow and lives in an apartment at [facility].  She has in-home care 5 days a week for at least 5-1/2 hours and  on Saturdays for several hours.  Her son lives in [place].  No  smoking or alcohol.   FAMILY HISTORY:  Significant for heart disease in her mother and father.  Her son has an AICD but has not had any additional heart disease.   PHYSICAL  EXAM:  Temperature 98.1, blood pressure initially 179/103,  subsequently 156/88, pulse 94, respirations 20, oxygen saturation 97% on  room air.  GENERAL:  Pleasant elderly female in no acute distress.  HEENT:  No periorbital edema.  Oropharynx is moist.  No scleral icterus.  NECK:  Supple without lymphadenopathy, JVD or carotid bruits.  HEART:  Regular rate and rhythm without murmurs, rubs or gallops.  LUNGS:  Clear to auscultation bilaterally with no crackles.  ABDOMEN:  Soft, nondistended, nontender with normoactive bowel sounds,  no hepatosplenomegaly.  No apparent ascites.  EXTREMITIES:  Show bilateral lower extremity pitting edema to her knee  with a peau d'orange texture with warmth and erythema below both knees.  She does have valgus deformities of both lower legs.  Her right upper  extremity also has trace edema with no erythema.   LABS:  CBC shows a white count of 5.5, hemoglobin 14.1, platelets 180.  BMET significant for sodium 139, potassium 3.3, chloride 107, bicarb 25,  BUN 14, creatinine 0.7, glucose 185.    ASSESSMENT/PLAN:  1. Bilateral lower extremity cellulitis:  She has failed outpatient      therapy with appropriate oral antibiotics.  Therefore, she will be      admitted for IV antibiotics with vancomycin and Levaquin given her      PENICILLIN allergy.  The failure of outpatient therapy may be more      related to edema, but may also be due to a resistant organism such      as MRSA.  2. Bilateral lower extremity edema:  The symmetric nature is      consistent with venous stasis changes.  Will obtain a BNP with      consideration of an echocardiogram to evaluate cardiac function if      her BNP is markedly elevated.  Will also obtain urinalysis and      urine protein to creatinine ratio to rule out nephrotic syndrome in      the setting of her diabetes.  I doubt that she has had a DVT given      the symmetric nature and limited risk factors.  We will treat with      Lasix 40 mg IV daily with daily weights and strict I's and O's.      Will also place compression stockings and elevate legs.  3. Type 2 diabetes:  Lantus 20 units q.h.s. and sliding scale insulin      until normal diet, then resume 70/30 at discharge.  4. Hypertension:  Her blood pressure was markedly elevated on      admission but has improved subsequently.  This      should improve with Lasix and continuation of her home medical      regimen.  5. Disposition:  Anticipate discharge in 2-3 days with possible need      for home and IV antibiotics and home care.  She does have a good      support system at home with caregivers.      Kari Baars, M.D.  Electronically Signed     WS/MEDQ  D:  02/14/2008  T:  02/15/2008  Job:  [id number]   cc:   Vania Rea. Jarold Motto, MD, FACG, FACP, FAGA  [address]  [place]  [place] [address]   Colleen Can. Deborah Chalk, M.D.  Fax: [phone]

## 2010-06-08 NOTE — Op Note (Signed)
Shelia Webb, Shelia Webb                            ACCOUNT NO.:  [id number]   MEDICAL RECORD NO.:  [id number]                   PATIENT TYPE:  AMB   LOCATION:  DAY                                  FACILITY:  [facility]   PHYSICIAN:  Claudette Laws, M.D.               DATE OF BIRTH:  Jul 04, 1921   DATE OF PROCEDURE:  06/15/2002  DATE OF DISCHARGE:                                 OPERATIVE REPORT   PREOPERATIVE DIAGNOSIS:  Distal left ureteral calculus with renal colic and  hydronephrosis.   POSTOPERATIVE DIAGNOSIS:  Distal left ureteral calculus with renal colic and  hydronephrosis.   OPERATION/PROCEDURE:  1. Cystoscopy.  2. Rigid ureteroscopy, left ureter.  3. Holmium laser of ureteral calculus.  4. Insertion of double-J stent.   SURGEON:  Claudette Laws, M.D.   DESCRIPTION OF PROCEDURE:  The patient was prepped and draped in the dorsal  lithotomy position under intubated general anesthesia.  Cystoscopy was  performed with a 22-French cystoscope and 12-degree lens revealing a normal-  appearing bladder with no tumors, no calculi, normal right ureteral orifice  She had a bulging left ureteral orifice, +1 trabeculation.   Initially I was able to pass up a 0.038 glide wire through a 6-French open-  ended ureteral catheter and using fluoroscopic control, that was positioned  in the left kidney.  However, the stone proved to be impacted in the  intramural portion of the left ureter and I was never able to get up the 6-  [place] open-ended catheter.   I then converted to a short rigid 6.5-French ureteroscope and during the  course of the manipulation the top portion of the stone had eroded through  the distal ureter creating a flap at about the 11 o'clock position.  At this  point, since I could not engage the stone any further, using the slim-line  holmium laser fiber, lithotripsy was performed on the stone until it broke  up into a few pieces.  I then reintroduced the scope but I never  could get  it by the stone.  However, I was able to get the laser fiber on the stone  and break it up again into manageable pieces.  At this point, there was some  edema created from the trauma of the manipulation and I thought that I had  broken up the stone enough so that if we could get up a double-J stent,  leave it in a few days, the stone particles would migrate out spontaneously.  So after several minutes then, I backloaded the guide wire through a  cystoscope.  I was able to pass up the 6-French, 26 cm, double-J stent  without any problems.  It was positioned in the kidney.  Distal end was  curled up on bladder, again using fluoroscopic control.  The bladder was  emptied and a B&O suppository was placed for anesthetic purposes.  The patient was then taken back to the recovery room in satisfactory  condition.                                               Claudette Laws, M.D.    RFS/MEDQ  D:  06/15/2002  T:  06/15/2002  Job:  [id number]

## 2010-06-08 NOTE — Discharge Summary (Signed)
Shelia Webb, Shelia Webb                            ACCOUNT NO.:  [id number]   MEDICAL RECORD NO.:  [id number]                   PATIENT TYPE:  OBV   LOCATION:  0357                                 FACILITY:  [facility]   PHYSICIAN:  Claudette Laws, M.D.               DATE OF BIRTH:  May 24, 1921   DATE OF ADMISSION:  06/15/2002  DATE OF DISCHARGE:  06/16/2002                                 DISCHARGE SUMMARY   HISTORY:  This is a 77 year old insulin-dependent diabetic who presented in  my office with a 5- to 6-mm distal obstructing ureteral stone with renal  colic and hydronephrosis.  She had been in the [facility] ER a couple of  days prior with colicky pain.  When we saw her, there was no change in the  stone.  She also had some stones in the lower-pole left kidney.  She was  having a considerable amount of pain and desired some relief, and so I  offered her an attempt at ureteroscopy.  Patient has multiple medical  problems, including coronary artery disease, angina, insulin-dependent  diabetes, GERD.  She sees Dr. Jarome Matin for her primary care.  The  procedure was carefully explained to her; she understands and agrees to the  proposed surgery.   PERTINENT LABORATORY DATA:  Her KUB preop showed no change in the distal  left ureteral stone.  Her chest x-ray showed borderline cardiomegaly but no  active disease.  EKG showed normal sinus rhythm.  Electrolytes were normal  except for a sodium of 134, a glucose of 132, creatinine 0.8, BUN was 18.  INR was 0.9.  White cell count was 5400, hemoglobin 15.5, hematocrit 45.3.  Urine culture is pending.   HOSPITAL COURSE:  The patient came in in the a.m. on Jun 15, 2002, and was  found to have a rather large, impacted distal left ureteral stone.  I was  able to unroof it somewhat and then break it up with the holmium laser  fiber.  It proved to be a rather difficult stone, but I thought I had broken  it up into enough pieces so that when I  put in the double-J stent and  subsequently remove it, she should be able to pass any residual particles.   The patient was observed overnight; she did fine, no significant problems.  She was sent home the next day to see me in the office in one week for  followup, and I plan to remove the double-J stent in approximately one to  two weeks.   FINAL DIAGNOSES:  1. Large distal left ureteral calculus with left hydronephrosis and renal     colic.  2. Left lower pole renal calculi.  3. Coronary artery disease.  4. Gastroesophageal reflux disease.  5. Insulin-dependent diabetes.   OPERATION:  Cystoscopy, rigid left ureteroscopy with holmium laser  lithotripsy of ureteral calculus, insertion of  a double-J 6-French stent.   COMPLICATIONS:  None.   CONDITION ON DISCHARGE:  Stable.   DISCHARGE MEDICATIONS:  1. To include Cipro 250 mg b.i.d., number 10, for five days.  2. Tylenol No. 3, number 25, p.r.n. pain.  3. To renew all of her home medications except aspirin, and these include     Imdur 30 mg in the morning and 60 mg at night.  4. Atenolol 50 mg per day.  5. Detrol LA 4 mg for her urinary incontinence.  6. Resume her insulin, Lantus 4 units in the morning.  7. Protonix 40 mg in the morning.  8. Verapamil 120 mg in the morning.  9. Nitroglycerin 0.4 mg sublingual p.r.n.  10.      Potassium chloride 10 mEq per day.  11.      Plavix one per day.  12.      Amitriptyline at bedtime for sleep.  13.      Lasix 20 mg per day.   DISPOSITION:   DIET:  Regular diet.  Force fluids.   ACTIVITY:  Limited activity.   FOLLOW UP:  See me in the office in one week for followup.                                               Claudette Laws, M.D.    RFS/MEDQ  D:  06/16/2002  T:  06/16/2002  Job:  [id number]   cc:   Barry Dienes. Eloise Harman, M.D.  [address]  [place]  [place] [address]  Fax: [phone]

## 2010-06-08 NOTE — Cardiovascular Report (Signed)
[place]  Patient:    Shelia Webb, Shelia Webb                         MRN: [id number] Proc. Date: 04/25/99 Adm. Date:  [id number] Attending:  Eleanora Neighbor CC:         Cardiac Catheterization Laboratory             Barry Dienes. Eloise Harman, M.D.             Alfonse Alpers. Dagoberto Ligas, M.D.                        Cardiac Catheterization  PROCEDURE:  Left heart catheterization with selective coronary angiography and eft ventricular angiography.  CARDIOLOGIST:  Colleen Can. Deborah Chalk, M.D.  INDICATIONS:  Ms. Szczygiel is a 77 year old female with longstanding diabetes mellitus, a history of recurrent infections of the lower extremities, hypertension for 30 years, and a history of previous cerebrovascular disease.  She presents ith ongoing dyspnea and chest heaviness and had an adenosine Cardiolite with equivocal anterior ischemia.  Because of the symptoms, as well as multiple risk factors, he is referred for a cardiac catheterization.  TYPE AND SITE OF ENTRY:  Percutaneous right femoral artery.  CATHETERS:  A 6-french #4 curved Judkins right and left coronary catheters, 6-French pigtail ventriculographic catheter.  CONTRAST MATERIAL:  Omnipaque.  MEDICATIONS GIVEN  PRIOR TO PROCEDURE:  Valium 10 mg p.o.  MEDICATIONS GIVEN DURING PROCEDURE:  Versed 1 mg IV.  COMMENTS:  The patient tolerated the procedure well.  ANGIOGRAPHIC DATA: 1. Left main coronary artery:  The left main coronary artery was normal. 2. Left circumflex coronary artery:  The left circumflex continued primarily    as a large obtuse marginal.  At the origin of the continuation branch, there    is a 95% focal stenosis.  At this same bifurcation point, there is about a    50% narrowing in the main obtuse marginal.  The obtuse marginal continues    on and then bifurcates.  The more superior branch of the bifurcation has what    would appear to be a 70%-80% focal stenosis. 3. Left anterior  descending coronary artery:  The left anterior descending is a    large vessel that wraps around the apex.  The first diagonal vessel has a    70% segmental disease over the first third of its course.  After the diagonal    vessel, the left anterior descending begins to get quite irregular, but it    does not really become severely stenotic until before the apex, when there    are sequential 90%+ lesions.  The vessel at this point in time would be    about 1.5 to 2.0 mm in diameter.  More proximally, the left anterior descending    is probably 3.0 to 3.5 mm in diameter. 4. Right coronary artery:  The right coronary artery is a moderate-sized    dominant vessel.  There is a distal tapering of disease.  No significant    focal narrowings in the right coronary artery.  LEFT VENTRICULAR ANGIOGRAM:  The left ventricular angiogram is performed in the RAO position.  Overall cardiac size and silhouette are normal.  Left ventricular function is normal.  HEMODYNAMIC DATA: Aortic pressure:  114/59. LV:  123/11.  There is on aortic valve gradient noted on pullback.  OVERALL IMPRESSION: 1. Normal left ventricular function with normal left  ventricular filling    pressures. 2. Diffuse two-vessel coronary artery disease with distal coronary atherosclerosis    involving the continuation of the left circumflex and a distal branch of    the obtuse marginal, first diagonal vessel of the left anterior descending,    and the left anterior descending as it crosses the apex, and minimal disease    in the right coronary artery.  DISCUSSION:  It is felt that with her longstanding diabetes mellitus and diffuse distal disease, that she is best managed medically.  She has a normal ejection fraction and should have a satisfactory overall prognosis.  The history of lower extremity infections provide some concern about the presence of conduit, although it is suspected that she would have enough conduit for  bypass surgery, but with her distal disease, it really would not be a great deal of benefit to her. DD:  04/25/99 TD:  04/25/99 Job: [id number]

## 2010-08-22 ENCOUNTER — Telehealth (INDEPENDENT_AMBULATORY_CARE_PROVIDER_SITE_OTHER): Payer: Self-pay

## 2010-08-22 NOTE — Telephone Encounter (Signed)
Pt's son left me a voicemail message requesting when the pt needed follow up again with Dr Dwain Sarna. The last note of DR Doreen Salvage states she is due to come back in 6months. I left this on his voicemail telling him his mother is due back in November 2012./ [facility]

## 2010-12-03 ENCOUNTER — Other Ambulatory Visit (HOSPITAL_COMMUNITY): Payer: Self-pay | Admitting: Orthopedic Surgery

## 2010-12-03 ENCOUNTER — Encounter (HOSPITAL_COMMUNITY): Payer: Self-pay | Admitting: Pharmacy Technician

## 2010-12-05 ENCOUNTER — Encounter (HOSPITAL_COMMUNITY)
Admission: RE | Admit: 2010-12-05 | Discharge: 2010-12-05 | Disposition: A | Discharge status: Home / Self Care | Payer: Medicare Other | Source: Ambulatory Visit | Attending: Orthopedic Surgery | Admitting: Orthopedic Surgery

## 2010-12-05 ENCOUNTER — Other Ambulatory Visit: Payer: Self-pay

## 2010-12-05 ENCOUNTER — Encounter (HOSPITAL_COMMUNITY): Payer: Self-pay | Admitting: Vascular Surgery

## 2010-12-05 ENCOUNTER — Encounter (HOSPITAL_COMMUNITY): Payer: Self-pay

## 2010-12-05 HISTORY — DX: Chronic kidney disease, unspecified: N18.9

## 2010-12-05 HISTORY — DX: Unspecified osteoarthritis, unspecified site: M19.90

## 2010-12-05 HISTORY — DX: Depression, unspecified: F32.A

## 2010-12-05 HISTORY — DX: Restless legs syndrome: G25.81

## 2010-12-05 HISTORY — DX: Acute respiratory failure, unspecified whether with hypoxia or hypercapnia: J96.00

## 2010-12-05 HISTORY — DX: Peripheral vascular disease, unspecified: I73.9

## 2010-12-05 HISTORY — DX: Other neuromuscular dysfunction of bladder: N31.8

## 2010-12-05 HISTORY — DX: Cardiac arrhythmia, unspecified: I49.9

## 2010-12-05 HISTORY — DX: Major depressive disorder, single episode, unspecified: F32.9

## 2010-12-05 HISTORY — DX: Anemia, unspecified: D64.9

## 2010-12-05 HISTORY — DX: Atherosclerotic heart disease of native coronary artery without angina pectoris: I25.10

## 2010-12-05 HISTORY — DX: Essential (primary) hypertension: I10

## 2010-12-05 HISTORY — DX: Coronary atherosclerosis due to lipid rich plaque: I25.83

## 2010-12-05 HISTORY — DX: Gastro-esophageal reflux disease without esophagitis: K21.9

## 2010-12-05 HISTORY — DX: Anxiety disorder, unspecified: F41.9

## 2010-12-05 LAB — CBC
HCT: 42.2 % (ref 36.0–46.0)
Hemoglobin: 14.1 g/dL (ref 12.0–15.0)
MCH: 27.9 pg (ref 26.0–34.0)
MCV: 83.4 fL (ref 78.0–100.0)
Platelets: 176 10*3/uL (ref 150–400)
RBC: 5.06 MIL/uL (ref 3.87–5.11)
WBC: 7.4 10*3/uL (ref 4.0–10.5)

## 2010-12-05 LAB — COMPREHENSIVE METABOLIC PANEL
AST: 12 U/L (ref 0–37)
CO2: 28 mEq/L (ref 19–32)
Chloride: 93 mEq/L — ABNORMAL LOW (ref 96–112)
Creatinine, Ser: 0.69 mg/dL (ref 0.50–1.10)
GFR calc Af Amer: 89 mL/min — ABNORMAL LOW (ref >90)
GFR calc non Af Amer: 77 mL/min — ABNORMAL LOW (ref >90)
Glucose, Bld: 458 mg/dL — ABNORMAL HIGH (ref 70–99)
Total Bilirubin: 0.2 mg/dL — ABNORMAL LOW (ref 0.3–1.2)

## 2010-12-05 LAB — PROTIME-INR: INR: 0.99 (ref 0.00–1.49)

## 2010-12-05 NOTE — Pre-Procedure Instructions (Signed)
20 Shelia Webb  12/05/2010   Your procedure is scheduled on:  Friday December 07, 2010  Report to [facility] at 1130 AM.  Call this number if you have problems the morning of surgery: [phone]   Remember:   Do not eat food:After Midnight.  Do not drink clear liquids: 4 Hours before arrival.May have soda, black coffee, tea, grape juice, apple juice, broth or water before 7:30am  Take these medicines the morning of surgery with A SIP OF WATER: Toprol, hydrocodone, gabapentin, prilosec, requip, restasis, advair, albuterol, (No diabetic medication/insulin morning of surgery).  May only have 1/2 usual dose of insulin the night before surgery.   Do not wear jewelry, make-up or nail polish.  Do not wear lotions, powders, or perfumes. You may wear deodorant.  Do not shave 48 hours prior to surgery.  Do not bring valuables to the hospital.  Contacts, dentures or bridgework may not be worn into surgery.  Leave suitcase in the car. After surgery it may be brought to your room.  For patients admitted to the hospital, checkout time is 11:00 AM the day of discharge.   Patients discharged the day of surgery will not be allowed to drive home.  Name and phone number of your driver: Medical transportation  Special Instructions: CHG Shower Use Special Wash: 1/2 bottle night before surgery and 1/2 bottle morning of surgery. Wash body from the neck down but do not use wash on face or private area.  Use personal soap for those areas.     Please read over the following fact sheets that you were given: Pain Booklet, Coughing and Deep Breathing, MRSA Information and Surgical Site Infection Prevention.  NOTE TO NURSES:  Please send MAR day of surgery with most recent date and time medications administered.  Please send patients Code status and copy of of HealthCare power of attorney.

## 2010-12-06 MED ORDER — CLINDAMYCIN PHOSPHATE 600 MG/50ML IV SOLN
600.0000 mg | INTRAVENOUS | Status: DC
  Filled 2010-12-06: qty 50

## 2010-12-06 NOTE — Progress Notes (Signed)
Anesthesia PA Revonda Standard ) to look at EKG

## 2010-12-06 NOTE — Progress Notes (Signed)
Lab result called to [facility] on [address], spoke with [place] ( the patient's nurse) and made aware that the patient's nasal swab is positive for staph and to start the mupirocin ointment.

## 2010-12-06 NOTE — Consult Note (Signed)
Anesthesia:  77 year old female for left knee hamstring release and ORIF patella.  She resides at a SNF with Dr. Baltazar Najjar as her PCP.  She has been seen be Dr. Deborah Chalk, but not in several years.  Her hx is significant for paroxsymal afib, DM, CAD, CKD, and deconditioning.  In June 2011 she underwent surgery with colostomy for a perforated sigmoid colon.  She has not undergone cardiac cath since 2001 (report on chart).  Her CAD has been managed medically since.  She did have an echocardiogram on 10/19/08 showing EF 55-65%, No MR or AR/AS.  EKG at PAT showed SR but with evidence of possible prior inferior and anterior infarct.  The interpreting Cardiologist did not feel that this was significantly changed from her prior EKG in December 2011.  I reviewed her cardiac hx with Dr. Chaney Malling.  Anesthesiologist will evaluate her pre-operatively.  Also her glucose was significantly elevated.  This will be rechecked on arrival to Short Stay.  I did fax the labs to Dr. Lajoyce Corners for his review.    Disposition pending follow-up labs and Anesthesiologist evaluation.

## 2010-12-07 ENCOUNTER — Ambulatory Visit (HOSPITAL_COMMUNITY)
Admission: RE | Admit: 2010-12-07 | Discharge: 2010-12-07 | Disposition: A | Discharge status: Home / Self Care | Payer: Medicare Other | Source: Ambulatory Visit | Attending: Orthopedic Surgery | Admitting: Orthopedic Surgery

## 2010-12-07 ENCOUNTER — Encounter (HOSPITAL_COMMUNITY)
Admission: RE | Disposition: A | Discharge status: Home / Self Care | Payer: Self-pay | Source: Ambulatory Visit | Attending: Orthopedic Surgery

## 2010-12-07 DIAGNOSIS — Z5309 Procedure and treatment not carried out because of other contraindication: Secondary | ICD-10-CM | POA: Insufficient documentation

## 2010-12-07 DIAGNOSIS — Z01812 Encounter for preprocedural laboratory examination: Secondary | ICD-10-CM | POA: Insufficient documentation

## 2010-12-07 DIAGNOSIS — X58XXXA Exposure to other specified factors, initial encounter: Secondary | ICD-10-CM | POA: Insufficient documentation

## 2010-12-07 DIAGNOSIS — Z01810 Encounter for preprocedural cardiovascular examination: Secondary | ICD-10-CM | POA: Insufficient documentation

## 2010-12-07 DIAGNOSIS — S82009A Unspecified fracture of unspecified patella, initial encounter for closed fracture: Secondary | ICD-10-CM | POA: Insufficient documentation

## 2010-12-07 DIAGNOSIS — Z01818 Encounter for other preprocedural examination: Secondary | ICD-10-CM | POA: Insufficient documentation

## 2010-12-07 LAB — GLUCOSE, CAPILLARY: Glucose-Capillary: 530 mg/dL — ABNORMAL HIGH (ref 70–99)

## 2010-12-07 SURGERY — OPEN REDUCTION INTERNAL FIXATION (ORIF) PATELLA
Anesthesia: General | Site: Knee | Laterality: Left

## 2010-12-07 MED ORDER — POTASSIUM CHLORIDE IN NACL 20-0.9 MEQ/L-% IV SOLN: INTRAVENOUS | Status: DC

## 2010-12-07 MED ORDER — CHLORHEXIDINE GLUCONATE 4 % EX LIQD: 60.0000 mL | Freq: Once | CUTANEOUS | Status: DC

## 2010-12-07 SURGICAL SUPPLY — 39 items
BANDAGE ELASTIC 4 VELCRO ST LF (GAUZE/BANDAGES/DRESSINGS) ×2 IMPLANT
BANDAGE ELASTIC 6 VELCRO ST LF (GAUZE/BANDAGES/DRESSINGS) ×2 IMPLANT
BLADE SURG ROTATE 9660 (MISCELLANEOUS) ×2 IMPLANT
BNDG COHESIVE 6X5 TAN STRL LF (GAUZE/BANDAGES/DRESSINGS) ×2 IMPLANT
CLOTH BEACON ORANGE TIMEOUT ST (SAFETY) ×2 IMPLANT
COVER SURGICAL LIGHT HANDLE (MISCELLANEOUS) ×2 IMPLANT
CUFF TOURNIQUET SINGLE 34IN LL (TOURNIQUET CUFF) IMPLANT
CUFF TOURNIQUET SINGLE 44IN (TOURNIQUET CUFF) IMPLANT
DRAPE C-ARM 42X72 X-RAY (DRAPES) IMPLANT
DRSG ADAPTIC 3X8 NADH LF (GAUZE/BANDAGES/DRESSINGS) ×2 IMPLANT
DRSG PAD ABDOMINAL 8X10 ST (GAUZE/BANDAGES/DRESSINGS) ×2 IMPLANT
ELECT REM PT RETURN 9FT ADLT (ELECTROSURGICAL) ×2
ELECTRODE REM PT RTRN 9FT ADLT (ELECTROSURGICAL) ×1 IMPLANT
GLOVE BIOGEL PI IND STRL 9 (GLOVE) ×1 IMPLANT
GLOVE BIOGEL PI INDICATOR 9 (GLOVE) ×1
GLOVE SURG ORTHO 9.0 STRL STRW (GLOVE) ×2 IMPLANT
GOWN PREVENTION PLUS XLARGE (GOWN DISPOSABLE) ×4 IMPLANT
KIT BASIN OR (CUSTOM PROCEDURE TRAY) ×2 IMPLANT
KIT ROOM TURNOVER OR (KITS) ×2 IMPLANT
MANIFOLD NEPTUNE II (INSTRUMENTS) ×2 IMPLANT
NS IRRIG 1000ML POUR BTL (IV SOLUTION) ×2 IMPLANT
PACK ORTHO EXTREMITY (CUSTOM PROCEDURE TRAY) ×2 IMPLANT
PAD CAST 4YDX4 CTTN HI CHSV (CAST SUPPLIES) ×2 IMPLANT
PADDING CAST COTTON 4X4 STRL (CAST SUPPLIES) ×4
SPONGE LAP 4X18 X RAY DECT (DISPOSABLE) ×4 IMPLANT
STAPLER VISISTAT 35W (STAPLE) ×2 IMPLANT
SUCTION FRAZIER TIP 10 FR DISP (SUCTIONS) ×2 IMPLANT
SUT ETHILON 3 0 FSL (SUTURE) IMPLANT
SUT STEEL 5 (SUTURE) ×2 IMPLANT
SUT VIC AB 0 CT1 27 (SUTURE) ×2
SUT VIC AB 0 CT1 27XBRD ANBCTR (SUTURE) ×1 IMPLANT
SUT VIC AB 2-0 CT1 27 (SUTURE) ×4
SUT VIC AB 2-0 CT1 TAPERPNT 27 (SUTURE) ×2 IMPLANT
TOWEL OR 17X24 6PK STRL BLUE (TOWEL DISPOSABLE) ×2 IMPLANT
TOWEL OR 17X26 10 PK STRL BLUE (TOWEL DISPOSABLE) ×2 IMPLANT
TUBE CONNECTING 12X1/4 (SUCTIONS) ×2 IMPLANT
UNDERPAD 30X30 INCONTINENT (UNDERPADS AND DIAPERS) ×2 IMPLANT
WATER STERILE IRR 1000ML POUR (IV SOLUTION) ×2 IMPLANT
YANKAUER SUCT BULB TIP NO VENT (SUCTIONS) IMPLANT

## 2010-12-07 NOTE — Progress Notes (Signed)
Patient pre-admitted on 12/05/10. Labs, EKG, and chest x-ray performed on date of pre-admit. Duplicate orders from 12/07/10 discontinued.

## 2010-12-07 NOTE — Progress Notes (Signed)
CBG 530 at 1130 and 480 at 1258. Dr. Lajoyce Corners notified. Surgery canceled per Dr. Lajoyce Corners and instructed to call living facility for pick up. Office will call [facility] to reschedule. Called [facility], spoke with Glendon Axe, RN to inform of elevated glucose levels. [facility] to follow up.

## 2010-12-11 ENCOUNTER — Encounter (HOSPITAL_COMMUNITY): Payer: Self-pay | Admitting: Orthopedic Surgery

## 2010-12-17 ENCOUNTER — Ambulatory Visit (INDEPENDENT_AMBULATORY_CARE_PROVIDER_SITE_OTHER): Payer: Medicare Other | Admitting: General Surgery

## 2010-12-17 ENCOUNTER — Encounter (INDEPENDENT_AMBULATORY_CARE_PROVIDER_SITE_OTHER): Payer: Self-pay | Admitting: General Surgery

## 2010-12-17 DIAGNOSIS — K439 Ventral hernia without obstruction or gangrene: Secondary | ICD-10-CM

## 2010-12-17 NOTE — Progress Notes (Signed)
Subjective:     Patient ID: Shelia Webb, female   DOB: Mar 27, 1921, 77 y.o.   MRN: [id number]  HPI This is an 77 year old female I know well from a colectomy and colostomy. She recently has fractured her left patella and is awaiting surgery on that. She was sent back from her facility I think today to reevaluate her parastomal hernia. She reports no complaints although this is a large parastomal hernia. She has no symptoms of any obstruction or colostomy is working well. She has no real discomfort from this at all either. She does not really want anything done with this at the current time either.   Review of Systems     Objective:   Physical Exam    abdomen is soft, nontender, colostomy pink and functional, large parastomal hernia does not reduce all the way but is nontender Assessment:     Parastomal hernia    Plan:     She and I discussed the options again which include following this versus surgery. We discussed again some of the risks involved with the surgery and she would like to just continue observing his right now. It is not really symptomatic to her at all. I told her she could wear a binder. She is also did her knee fixed soon as well. She is going to call me if she needs any further evaluation.

## 2010-12-18 ENCOUNTER — Other Ambulatory Visit (HOSPITAL_COMMUNITY): Payer: Self-pay | Admitting: Orthopedic Surgery

## 2010-12-20 ENCOUNTER — Encounter (HOSPITAL_COMMUNITY): Payer: Self-pay

## 2010-12-20 MED ORDER — CLINDAMYCIN PHOSPHATE 600 MG/50ML IV SOLN
600.0000 mg | INTRAVENOUS | Status: AC
  Administered 2010-12-21: 600 mg via INTRAVENOUS
  Filled 2010-12-20 (×2): qty 50

## 2010-12-21 ENCOUNTER — Ambulatory Visit (HOSPITAL_COMMUNITY): Payer: Medicare Other | Admitting: Anesthesiology

## 2010-12-21 ENCOUNTER — Encounter (HOSPITAL_COMMUNITY)
Admission: RE | Disposition: A | Discharge status: Skilled Nursing Facility | Payer: Self-pay | Source: Ambulatory Visit | Attending: Orthopedic Surgery

## 2010-12-21 ENCOUNTER — Encounter (HOSPITAL_COMMUNITY): Payer: Self-pay | Admitting: Anesthesiology

## 2010-12-21 ENCOUNTER — Inpatient Hospital Stay (HOSPITAL_COMMUNITY)
Admission: RE | Admit: 2010-12-21 | Discharge: 2010-12-25 | DRG: 494 | Disposition: A | Discharge status: Skilled Nursing Facility | Payer: Medicare Other | Source: Ambulatory Visit | Attending: Orthopedic Surgery | Admitting: Orthopedic Surgery

## 2010-12-21 DIAGNOSIS — I739 Peripheral vascular disease, unspecified: Secondary | ICD-10-CM | POA: Diagnosis present

## 2010-12-21 DIAGNOSIS — F341 Dysthymic disorder: Secondary | ICD-10-CM | POA: Diagnosis present

## 2010-12-21 DIAGNOSIS — I129 Hypertensive chronic kidney disease with stage 1 through stage 4 chronic kidney disease, or unspecified chronic kidney disease: Secondary | ICD-10-CM | POA: Diagnosis present

## 2010-12-21 DIAGNOSIS — S82009A Unspecified fracture of unspecified patella, initial encounter for closed fracture: Principal | ICD-10-CM | POA: Diagnosis present

## 2010-12-21 DIAGNOSIS — M24569 Contracture, unspecified knee: Secondary | ICD-10-CM | POA: Diagnosis present

## 2010-12-21 DIAGNOSIS — X58XXXA Exposure to other specified factors, initial encounter: Secondary | ICD-10-CM | POA: Diagnosis present

## 2010-12-21 DIAGNOSIS — G2581 Restless legs syndrome: Secondary | ICD-10-CM | POA: Diagnosis present

## 2010-12-21 DIAGNOSIS — M810 Age-related osteoporosis without current pathological fracture: Secondary | ICD-10-CM | POA: Diagnosis present

## 2010-12-21 DIAGNOSIS — N189 Chronic kidney disease, unspecified: Secondary | ICD-10-CM | POA: Diagnosis present

## 2010-12-21 DIAGNOSIS — Z7982 Long term (current) use of aspirin: Secondary | ICD-10-CM

## 2010-12-21 DIAGNOSIS — I2510 Atherosclerotic heart disease of native coronary artery without angina pectoris: Secondary | ICD-10-CM | POA: Diagnosis present

## 2010-12-21 DIAGNOSIS — Z880 Allergy status to penicillin: Secondary | ICD-10-CM

## 2010-12-21 DIAGNOSIS — E119 Type 2 diabetes mellitus without complications: Secondary | ICD-10-CM | POA: Diagnosis present

## 2010-12-21 DIAGNOSIS — Z79899 Other long term (current) drug therapy: Secondary | ICD-10-CM

## 2010-12-21 DIAGNOSIS — K219 Gastro-esophageal reflux disease without esophagitis: Secondary | ICD-10-CM | POA: Diagnosis present

## 2010-12-21 DIAGNOSIS — I4891 Unspecified atrial fibrillation: Secondary | ICD-10-CM | POA: Diagnosis present

## 2010-12-21 DIAGNOSIS — Z794 Long term (current) use of insulin: Secondary | ICD-10-CM

## 2010-12-21 HISTORY — PX: ORIF PATELLA: SHX5033

## 2010-12-21 LAB — BASIC METABOLIC PANEL
BUN: 16 mg/dL (ref 6–23)
Calcium: 9.7 mg/dL (ref 8.4–10.5)
GFR calc Af Amer: 89 mL/min — ABNORMAL LOW (ref >90)
GFR calc non Af Amer: 77 mL/min — ABNORMAL LOW (ref >90)
Potassium: 3.9 mEq/L (ref 3.5–5.1)
Sodium: 138 mEq/L (ref 135–145)

## 2010-12-21 LAB — GLUCOSE, CAPILLARY: Glucose-Capillary: 247 mg/dL — ABNORMAL HIGH (ref 70–99)

## 2010-12-21 LAB — CBC
HCT: 43.7 % (ref 36.0–46.0)
MCH: 27.7 pg (ref 26.0–34.0)
MCHC: 33.2 g/dL (ref 30.0–36.0)
RDW: 14.2 % (ref 11.5–15.5)

## 2010-12-21 SURGERY — OPEN REDUCTION INTERNAL FIXATION (ORIF) PATELLA
Anesthesia: General | Site: Knee | Laterality: Left | Wound class: Clean

## 2010-12-21 MED ORDER — METOPROLOL SUCCINATE ER 25 MG PO TB24
25.0000 mg | ORAL_TABLET | Freq: Every day | ORAL | Status: DC
  Administered 2010-12-22 – 2010-12-25 (×4): 25 mg via ORAL
  Filled 2010-12-21 (×4): qty 1

## 2010-12-21 MED ORDER — CALCIUM CARBONATE-VITAMIN D 500-200 MG-UNIT PO TABS
1.0000 | ORAL_TABLET | Freq: Every day | ORAL | Status: DC
  Administered 2010-12-22 – 2010-12-25 (×4): 1 via ORAL
  Filled 2010-12-21 (×4): qty 1

## 2010-12-21 MED ORDER — LACTATED RINGERS IV SOLN
INTRAVENOUS | Status: DC
  Administered 2010-12-21: 09:00:00 via INTRAVENOUS

## 2010-12-21 MED ORDER — ALBUTEROL SULFATE HFA 108 (90 BASE) MCG/ACT IN AERS
2.0000 | INHALATION_SPRAY | RESPIRATORY_TRACT | Status: DC | PRN
  Filled 2010-12-21: qty 6.7

## 2010-12-21 MED ORDER — HYDROMORPHONE HCL PF 1 MG/ML IJ SOLN
0.2500 mg | INTRAMUSCULAR | Status: DC | PRN
  Administered 2010-12-21: 0.5 mg via INTRAVENOUS
  Administered 2010-12-21 (×2): 0.25 mg via INTRAVENOUS

## 2010-12-21 MED ORDER — SODIUM CHLORIDE 0.9 % IV SOLN
INTRAVENOUS | Status: DC
  Administered 2010-12-21: 14:00:00 via INTRAVENOUS

## 2010-12-21 MED ORDER — OXYCODONE-ACETAMINOPHEN 5-325 MG PO TABS
1.0000 | ORAL_TABLET | ORAL | Status: DC | PRN
  Administered 2010-12-25: 2 via ORAL
  Filled 2010-12-21: qty 2

## 2010-12-21 MED ORDER — WARFARIN VIDEO: Freq: Once | Status: DC

## 2010-12-21 MED ORDER — ONDANSETRON HCL 4 MG/2ML IJ SOLN
4.0000 mg | Freq: Four times a day (QID) | INTRAMUSCULAR | Status: DC | PRN
Start: 2010-12-21 — End: 2010-12-21

## 2010-12-21 MED ORDER — PANTOPRAZOLE SODIUM 40 MG PO TBEC
40.0000 mg | DELAYED_RELEASE_TABLET | Freq: Every day | ORAL | Status: DC
  Administered 2010-12-22 – 2010-12-25 (×4): 40 mg via ORAL
  Filled 2010-12-21 (×3): qty 1

## 2010-12-21 MED ORDER — INSULIN ASPART 100 UNIT/ML ~~LOC~~ SOLN
3.0000 [IU] | Freq: Three times a day (TID) | SUBCUTANEOUS | Status: DC
  Administered 2010-12-22 – 2010-12-25 (×11): 3 [IU] via SUBCUTANEOUS
  Filled 2010-12-21: qty 3

## 2010-12-21 MED ORDER — LORATADINE 10 MG PO TABS
10.0000 mg | ORAL_TABLET | Freq: Every day | ORAL | Status: DC
  Administered 2010-12-22 – 2010-12-25 (×4): 10 mg via ORAL
  Filled 2010-12-21 (×4): qty 1

## 2010-12-21 MED ORDER — CYCLOSPORINE 0.05 % OP EMUL
1.0000 [drp] | Freq: Two times a day (BID) | OPHTHALMIC | Status: DC
  Administered 2010-12-21 – 2010-12-25 (×6): 1 [drp] via OPHTHALMIC
  Filled 2010-12-21 (×11): qty 1

## 2010-12-21 MED ORDER — COUMADIN BOOK
Freq: Once | Status: AC
  Administered 2010-12-21: 16:00:00
  Filled 2010-12-21: qty 1

## 2010-12-21 MED ORDER — SODIUM CHLORIDE 0.9 % IR SOLN
Status: DC | PRN
  Administered 2010-12-21: 1000 mL

## 2010-12-21 MED ORDER — PROPOFOL 10 MG/ML IV EMUL
INTRAVENOUS | Status: DC | PRN
  Administered 2010-12-21: 130 mg via INTRAVENOUS

## 2010-12-21 MED ORDER — INSULIN ASPART 100 UNIT/ML ~~LOC~~ SOLN
0.0000 [IU] | Freq: Three times a day (TID) | SUBCUTANEOUS | Status: DC
  Administered 2010-12-22: 3 [IU] via SUBCUTANEOUS
  Administered 2010-12-22: 2 [IU] via SUBCUTANEOUS
  Administered 2010-12-22 – 2010-12-23 (×2): 3 [IU] via SUBCUTANEOUS
  Administered 2010-12-23 (×2): 5 [IU] via SUBCUTANEOUS
  Administered 2010-12-24 (×2): 3 [IU] via SUBCUTANEOUS
  Administered 2010-12-24: 2 [IU] via SUBCUTANEOUS
  Administered 2010-12-25: 3 [IU] via SUBCUTANEOUS
  Filled 2010-12-21: qty 3

## 2010-12-21 MED ORDER — DIPHENHYDRAMINE HCL 12.5 MG/5ML PO ELIX
12.5000 mg | ORAL_SOLUTION | ORAL | Status: DC | PRN
  Filled 2010-12-21: qty 10

## 2010-12-21 MED ORDER — DULOXETINE HCL 60 MG PO CPEP
60.0000 mg | ORAL_CAPSULE | Freq: Every day | ORAL | Status: DC
  Administered 2010-12-21 – 2010-12-25 (×5): 60 mg via ORAL
  Filled 2010-12-21 (×5): qty 1

## 2010-12-21 MED ORDER — METOCLOPRAMIDE HCL 5 MG/ML IJ SOLN
5.0000 mg | Freq: Three times a day (TID) | INTRAMUSCULAR | Status: DC | PRN
  Filled 2010-12-21: qty 2

## 2010-12-21 MED ORDER — FENTANYL CITRATE 0.05 MG/ML IJ SOLN: 50.0000 ug | INTRAMUSCULAR | Status: DC | PRN

## 2010-12-21 MED ORDER — WARFARIN SODIUM 4 MG PO TABS
4.0000 mg | ORAL_TABLET | Freq: Once | ORAL | Status: AC
  Administered 2010-12-21: 4 mg via ORAL
  Filled 2010-12-21: qty 1

## 2010-12-21 MED ORDER — ROCURONIUM BROMIDE 100 MG/10ML IV SOLN
INTRAVENOUS | Status: DC | PRN
  Administered 2010-12-21: 30 mg via INTRAVENOUS

## 2010-12-21 MED ORDER — ONDANSETRON HCL 4 MG PO TABS: 4.0000 mg | ORAL_TABLET | Freq: Four times a day (QID) | ORAL | Status: DC | PRN

## 2010-12-21 MED ORDER — FUROSEMIDE 20 MG PO TABS
20.0000 mg | ORAL_TABLET | Freq: Every day | ORAL | Status: DC
  Administered 2010-12-22 – 2010-12-25 (×4): 20 mg via ORAL
  Filled 2010-12-21 (×4): qty 1

## 2010-12-21 MED ORDER — HYDROCODONE-ACETAMINOPHEN 5-325 MG PO TABS
1.0000 | ORAL_TABLET | ORAL | Status: DC | PRN
  Administered 2010-12-21 (×2): 1 via ORAL
  Administered 2010-12-22 – 2010-12-25 (×7): 2 via ORAL
  Filled 2010-12-21: qty 1
  Filled 2010-12-21 (×4): qty 2
  Filled 2010-12-21: qty 1
  Filled 2010-12-21 (×3): qty 2

## 2010-12-21 MED ORDER — GABAPENTIN 400 MG PO CAPS
400.0000 mg | ORAL_CAPSULE | Freq: Every day | ORAL | Status: DC
  Administered 2010-12-21 – 2010-12-24 (×4): 400 mg via ORAL
  Filled 2010-12-21 (×5): qty 1

## 2010-12-21 MED ORDER — CLINDAMYCIN PHOSPHATE 600 MG/50ML IV SOLN
600.0000 mg | Freq: Four times a day (QID) | INTRAVENOUS | Status: AC
  Administered 2010-12-21 – 2010-12-22 (×3): 600 mg via INTRAVENOUS
  Filled 2010-12-21 (×3): qty 50

## 2010-12-21 MED ORDER — INSULIN GLARGINE 100 UNIT/ML ~~LOC~~ SOLN
15.0000 [IU] | Freq: Two times a day (BID) | SUBCUTANEOUS | Status: DC
  Administered 2010-12-21 – 2010-12-25 (×8): 15 [IU] via SUBCUTANEOUS
  Filled 2010-12-21 (×2): qty 3

## 2010-12-21 MED ORDER — METOCLOPRAMIDE HCL 10 MG PO TABS: 5.0000 mg | ORAL_TABLET | Freq: Three times a day (TID) | ORAL | Status: DC | PRN

## 2010-12-21 MED ORDER — IPRATROPIUM BROMIDE 0.02 % IN SOLN: 500.0000 ug | Freq: Four times a day (QID) | RESPIRATORY_TRACT | Status: DC | PRN

## 2010-12-21 MED ORDER — ONDANSETRON HCL 4 MG/2ML IJ SOLN: 4.0000 mg | Freq: Four times a day (QID) | INTRAMUSCULAR | Status: DC | PRN

## 2010-12-21 MED ORDER — SENNA 8.6 MG PO TABS
1.0000 | ORAL_TABLET | Freq: Every day | ORAL | Status: DC
  Administered 2010-12-21 – 2010-12-24 (×4): 8.6 mg via ORAL
  Filled 2010-12-21 (×5): qty 1

## 2010-12-21 MED ORDER — FLUTICASONE-SALMETEROL 100-50 MCG/DOSE IN AEPB
1.0000 | INHALATION_SPRAY | Freq: Every day | RESPIRATORY_TRACT | Status: DC
  Administered 2010-12-22 – 2010-12-25 (×4): 1 via RESPIRATORY_TRACT
  Filled 2010-12-21: qty 14

## 2010-12-21 MED ORDER — ONDANSETRON HCL 4 MG/2ML IJ SOLN
INTRAMUSCULAR | Status: DC | PRN
  Administered 2010-12-21: 4 mg via INTRAVENOUS

## 2010-12-21 MED ORDER — GLYCOPYRROLATE 0.2 MG/ML IJ SOLN
INTRAMUSCULAR | Status: DC | PRN
  Administered 2010-12-21: .6 mg via INTRAVENOUS

## 2010-12-21 MED ORDER — THERA M PLUS PO TABS
1.0000 | ORAL_TABLET | Freq: Every day | ORAL | Status: DC
  Administered 2010-12-22 – 2010-12-25 (×4): 1 via ORAL
  Filled 2010-12-21 (×4): qty 1

## 2010-12-21 MED ORDER — HYDROMORPHONE HCL PF 1 MG/ML IJ SOLN
0.5000 mg | INTRAMUSCULAR | Status: DC | PRN
  Administered 2010-12-22 – 2010-12-23 (×3): 1 mg via INTRAVENOUS
  Filled 2010-12-21 (×3): qty 1

## 2010-12-21 MED ORDER — ESMOLOL HCL 10 MG/ML IV SOLN
INTRAVENOUS | Status: DC | PRN
  Administered 2010-12-21 (×2): 30 mg via INTRAVENOUS

## 2010-12-21 MED ORDER — FENTANYL CITRATE 0.05 MG/ML IJ SOLN
INTRAMUSCULAR | Status: DC | PRN
Start: 2010-12-21 — End: 2010-12-21
  Administered 2010-12-21 (×2): 50 ug via INTRAVENOUS

## 2010-12-21 MED ORDER — NEOSTIGMINE METHYLSULFATE 1 MG/ML IJ SOLN
INTRAMUSCULAR | Status: DC | PRN
  Administered 2010-12-21: 4 mg via INTRAVENOUS

## 2010-12-21 MED ORDER — LACTATED RINGERS IV SOLN
INTRAVENOUS | Status: DC | PRN
  Administered 2010-12-21 (×2): via INTRAVENOUS

## 2010-12-21 MED ORDER — EPHEDRINE SULFATE 50 MG/ML IJ SOLN
INTRAMUSCULAR | Status: DC | PRN
  Administered 2010-12-21 (×2): 10 mg via INTRAVENOUS
  Administered 2010-12-21: 5 mg via INTRAVENOUS

## 2010-12-21 MED ORDER — ROPINIROLE HCL 0.25 MG PO TABS
0.2500 mg | ORAL_TABLET | Freq: Two times a day (BID) | ORAL | Status: DC
  Administered 2010-12-21 – 2010-12-25 (×8): 0.25 mg via ORAL
  Filled 2010-12-21 (×9): qty 1

## 2010-12-21 SURGICAL SUPPLY — 52 items
BANDAGE ELASTIC 4 VELCRO ST LF (GAUZE/BANDAGES/DRESSINGS) ×2 IMPLANT
BANDAGE ELASTIC 6 VELCRO ST LF (GAUZE/BANDAGES/DRESSINGS) ×2 IMPLANT
BANDAGE GAUZE ELAST BULKY 4 IN (GAUZE/BANDAGES/DRESSINGS) ×1 IMPLANT
BLADE SURG 10 STRL SS (BLADE) ×2 IMPLANT
BLADE SURG ROTATE 9660 (MISCELLANEOUS) ×2 IMPLANT
BNDG COHESIVE 6X5 TAN STRL LF (GAUZE/BANDAGES/DRESSINGS) ×3 IMPLANT
CLOTH BEACON ORANGE TIMEOUT ST (SAFETY) ×2 IMPLANT
COVER SURGICAL LIGHT HANDLE (MISCELLANEOUS) ×2 IMPLANT
CUFF TOURNIQUET SINGLE 34IN LL (TOURNIQUET CUFF) IMPLANT
CUFF TOURNIQUET SINGLE 44IN (TOURNIQUET CUFF) IMPLANT
DRAPE C-ARM 42X72 X-RAY (DRAPES) IMPLANT
DRSG ADAPTIC 3X8 NADH LF (GAUZE/BANDAGES/DRESSINGS) ×2 IMPLANT
DRSG PAD ABDOMINAL 8X10 ST (GAUZE/BANDAGES/DRESSINGS) ×4 IMPLANT
ELECT REM PT RETURN 9FT ADLT (ELECTROSURGICAL) ×2
ELECTRODE REM PT RTRN 9FT ADLT (ELECTROSURGICAL) ×1 IMPLANT
GAUZE SPONGE 4X4 12PLY STRL LF (GAUZE/BANDAGES/DRESSINGS) ×1 IMPLANT
GLOVE BIOGEL PI IND STRL 9 (GLOVE) ×1 IMPLANT
GLOVE BIOGEL PI INDICATOR 9 (GLOVE) ×1
GLOVE SURG ORTHO 9.0 STRL STRW (GLOVE) ×2 IMPLANT
GOWN PREVENTION PLUS XLARGE (GOWN DISPOSABLE) ×4 IMPLANT
IMMOBILIZER KNEE 20 (SOFTGOODS) ×2
IMMOBILIZER KNEE 20 THIGH 36 (SOFTGOODS) IMPLANT
K-WIRE 2.0 (WIRE) ×8
K-WIRE FXSTD 280X2XNS SS (WIRE) ×4
KIT BASIN OR (CUSTOM PROCEDURE TRAY) ×2 IMPLANT
KIT ROOM TURNOVER OR (KITS) ×2 IMPLANT
KWIRE FXSTD 280X2XNS SS (WIRE) IMPLANT
MANIFOLD NEPTUNE II (INSTRUMENTS) ×2 IMPLANT
NS IRRIG 1000ML POUR BTL (IV SOLUTION) ×2 IMPLANT
PACK ORTHO EXTREMITY (CUSTOM PROCEDURE TRAY) ×2 IMPLANT
PAD ARMBOARD 7.5X6 YLW CONV (MISCELLANEOUS) ×4 IMPLANT
PAD CAST 4YDX4 CTTN HI CHSV (CAST SUPPLIES) ×2 IMPLANT
PADDING CAST COTTON 4X4 STRL (CAST SUPPLIES)
PASSER SUT SWANSON 36MM LOOP (INSTRUMENTS) ×1 IMPLANT
SPONGE GAUZE 4X4 12PLY (GAUZE/BANDAGES/DRESSINGS) ×2 IMPLANT
SPONGE LAP 4X18 X RAY DECT (DISPOSABLE) ×4 IMPLANT
STAPLER VISISTAT 35W (STAPLE) ×2 IMPLANT
SUCTION FRAZIER TIP 10 FR DISP (SUCTIONS) ×2 IMPLANT
SUT ETHILON 3 0 FSL (SUTURE) IMPLANT
SUT FIBERWIRE #5 38 CONV NDL (SUTURE) ×4
SUT STEEL 5 (SUTURE) ×1 IMPLANT
SUT VIC AB 0 CT1 27 (SUTURE)
SUT VIC AB 0 CT1 27XBRD ANBCTR (SUTURE) ×1 IMPLANT
SUT VIC AB 2-0 CT1 27 (SUTURE) ×4
SUT VIC AB 2-0 CT1 TAPERPNT 27 (SUTURE) ×2 IMPLANT
SUTURE FIBERWR #5 38 CONV NDL (SUTURE) IMPLANT
TOWEL OR 17X24 6PK STRL BLUE (TOWEL DISPOSABLE) ×2 IMPLANT
TOWEL OR 17X26 10 PK STRL BLUE (TOWEL DISPOSABLE) ×2 IMPLANT
TUBE CONNECTING 12X1/4 (SUCTIONS) ×2 IMPLANT
UNDERPAD 30X30 INCONTINENT (UNDERPADS AND DIAPERS) ×2 IMPLANT
WATER STERILE IRR 1000ML POUR (IV SOLUTION) ×2 IMPLANT
YANKAUER SUCT BULB TIP NO VENT (SUCTIONS) IMPLANT

## 2010-12-21 NOTE — Anesthesia Postprocedure Evaluation (Signed)
Anesthesia Post Note  Patient: Shelia Webb  Procedure(s) Performed:  OPEN REDUCTION INTERNAL (ORIF) FIXATION PATELLA - Left Knee Hamstring Release and Open Reduction Internal Fixation Patella  Anesthesia type: General  Patient location: PACU  Post pain: Pain level controlled and Adequate analgesia  Post assessment: Post-op Vital signs reviewed, Patient's Cardiovascular Status Stable, Respiratory Function Stable, Patent Airway and Pain level controlled  Last Vitals:  Filed Vitals:   12/21/10 1242  BP: 160/78  Pulse: 90  Temp:   Resp: 18    Post vital signs: Reviewed and stable  Level of consciousness: awake, alert  and oriented  Complications: No apparent anesthesia complications

## 2010-12-21 NOTE — Progress Notes (Signed)
ANTICOAGULATION CONSULT NOTE - Initial Consult  Pharmacy Consult for Coumadin  Indication: VTE prophylaxis  Allergies  Allergen Reactions  . Penicillins Swelling and Rash    Patient Measurements: Height: 5' (152.4 cm) Weight: 152 lb (68.947 kg) IBW/kg (Calculated) : 45.5   Vital Signs: Temp: 97.4 F (36.3 C) (11/30 1440) Temp src: Oral (11/30 0708) BP: 147/72 mmHg (11/30 1427) Pulse Rate: 96  (11/30 1427)  Labs:  [facility] 12/21/10 0716  HGB 14.5  HCT 43.7  PLT 181  APTT --  LABPROT --  INR --  HEPARINUNFRC --  CREATININE 0.68  CKTOTAL --  CKMB --  TROPONINI --   Estimated Creatinine Clearance: 43.7 ml/min (by C-G formula based on Cr of 0.68).  12/05/2010 INR 0.99  Medical History: Past Medical History  Diagnosis Date  . Hypertension   . Diabetes mellitus   . Dysrhythmia     a-fib  . Anemia   . Acute respiratory failure   . Chronic kidney disease     Kidney failure  . Allergic rhinitis   . Osteoarthrosis   . Osteoporosis   . Anxiety   . Depression   . Restless leg syndrome   . GERD (gastroesophageal reflux disease)   . Hypertonic bladder   . Peripheral vascular disease   . Coronary atherosclerosis due to lipid rich plaque     Medications:  Prescriptions prior to admission  Medication Sig Dispense Refill  . Artificial Tear Ointment (LACRI-LUBE OP) Place 1 drop into both eyes at bedtime.        Marland Kitchen aspirin 81 MG tablet Take 81 mg by mouth daily.        . calcium-vitamin D (OSCAL WITH D) 500-200 MG-UNIT per tablet Take 1 tablet by mouth daily.        . Cholecalciferol 50000 UNITS capsule Take 50,000 Units by mouth once a week.        . cycloSPORINE (RESTASIS) 0.05 % ophthalmic emulsion Place 1 drop into both eyes 2 (two) times daily.        . diclofenac sodium (VOLTAREN) 1 % GEL Apply 1 application topically 4 (four) times daily as needed. For left knee pain         . DULoxetine (CYMBALTA) 60 MG capsule Take 60 mg by mouth daily.        .  Fluticasone-Salmeterol (ADVAIR) 100-50 MCG/DOSE AEPB Inhale 1 puff into the lungs daily.        . furosemide (LASIX) 20 MG tablet Take 20 mg by mouth daily.        Marland Kitchen gabapentin (NEURONTIN) 400 MG capsule Take 400 mg by mouth at bedtime.        Marland Kitchen HYDROcodone-acetaminophen (NORCO) 7.5-325 MG per tablet Take 1 tablet by mouth every 4 (four) hours as needed. For pain        . insulin glargine (LANTUS) 100 UNIT/ML injection Inject 15 Units into the skin 2 (two) times daily.        . metoprolol succinate (TOPROL-XL) 25 MG 24 hr tablet Take 25 mg by mouth daily.        . Multiple Vitamins-Minerals (MULTIVITAMINS THER. W/MINERALS) TABS Take 1 tablet by mouth daily.        Marland Kitchen omeprazole (PRILOSEC) 20 MG capsule Take 20 mg by mouth daily.        Bertram Gala Glycol-Propyl Glycol (SYSTANE) 0.4-0.3 % GEL Place 1 drop into both eyes 4 (four) times daily.        . polyethylene glycol (  MIRALAX / GLYCOLAX) packet Take 17 g by mouth daily as needed. For constipation        . rOPINIRole (REQUIP) 0.25 MG tablet Take 0.25 mg by mouth 2 (two) times daily.        Marland Kitchen senna (SENOKOT) 8.6 MG TABS Take 1 tablet by mouth at bedtime.        Marland Kitchen albuterol (PROVENTIL HFA;VENTOLIN HFA) 108 (90 BASE) MCG/ACT inhaler Inhale 2 puffs into the lungs every 4 (four) hours as needed. For shortness of breath        . benzonatate (TESSALON) 100 MG capsule Take 100 mg by mouth 3 (three) times daily as needed.        . cetirizine-pseudoephedrine (ZYRTEC-D) 5-120 MG per tablet Take 1 tablet by mouth 2 (two) times daily as needed. For allergies        . hydroxypropyl cellulose (LACRISERT) 5 MG INST 5 mg daily.        Marland Kitchen ipratropium (ATROVENT) 0.02 % nebulizer solution Take 500 mcg by nebulization every 6 (six) hours as needed. For congestion         Assessment: 77 yo female s/p ORIF for patella fracture to start Coumadin per Dr. Lajoyce Corners for VTE prophylaxis. Currently patient is not having any bleeding per RN. Last INR was 12/05/10 at 0.99.  Patient was not taking Coumadin prior to admission, although she does have a history of Afib which was controlled with metoprolol. Patient has normal albumin and LFTs are normal. Surgical end time was 11:51am.   Goal of Therapy:  INR 2-3   Plan:  1. Coumadin 4mg  po x1 tonight. 2. Coumadin book and video for patient. 3. Will educate patient when more alert/oriented/decreased pain.  4. Will order daily PT/INR if not scheduled already.   Fayne Norrie 12/21/2010,4:04 PM

## 2010-12-21 NOTE — Preoperative (Signed)
Beta Blockers   Reason not to administer Beta Blockers:B blocker taken 12/21/10 @ 5am

## 2010-12-21 NOTE — Op Note (Signed)
Orthopaedic OPERATIVE NOTE  [name] [birth year] [id number] 12/21/2010  PRE-OP DX: Left Knee Contracture with Patella Fracture POST-OP DX: Left Knee Contracture with Patella Fracture  Procedure(s): OPEN REDUCTION INTERNAL (ORIF) FIXATION PATELLA Left Release of the hamstring tendons left knee  Surgeon(s): Nadara Mustard, MD  ASSISTANT: None  General  COMPLICATIONS: NONE  EBL PER ANESTHESIA NOTE  DISPOSITION: PACU IN STABLE CONDITION  INDICATION: Shelia Webb is a 77 y.o. female who presents status post patella fracture. Patient is essentially non-ambulatory has developed a flexion contracture of 120 of the left knee with a displaced patella fracture with impending skin breakdown and necrosis secondary to the flexion contracture in the patella fracture. Due to his failure conservative treatment patient presents at this time for release of the hamstrings and internal fixation of the patella. Risks and benefits were discussed with the patient and her family including infection neurovascular injury nonhealing of bone not healing the skin. Patient and family state they understand and wish to proceed at this time.  PROCEDURE DETAIL: Patient was brought to OR room 5 and underwent a general anesthetic. After adequate levels of anesthesia were obtained patient's left lower extremity was prepped using DuraPrep and draped into a sterile field. Attention was first focused on the hamstrings. The posterior medial and posterior lateral hamstrings were released to take the knee from 120 of flexion contracture to approximately 30 short of full extension. These wounds were irrigated hemostasis was obtained subcutaneous is closed using 2-0 Vicryl the skin was closed using approximate staples. Attention was then focused on the patella. A midline incision was made and this was slightly curved to avoid the areas of ischemia of the skin. This was carried down to the patella the edges of the patellofemoral  fashion. First attempt was to perform internal fixation with K wires and a figure 8 tension band technique. Due to the extreme osteoporosis and weakness of the bone the bone could not hold a K wire and so the internal fixation was removed. The patella was then repaired using #5 FiberWire. This was released proximally using a Krakauer technique 4 sutures exited the patella proximally drill holes were made distally and the patella was repaired and sutured with the suture knots distal to the patella. The wound is irrigated with normal saline. Subcutaneous was closed using 2-0 Vicryl the skin was closed using approximate staples the wound is covered with Adaptic orthopedic sponges AB dressing Kerlix and Coban. A knee immobilizer was applied with no metal in knee immobilizer. This was set to keep the patient's leg in approximately 30 of flexion. Patient was extubated and taken to the PACU in stable condition.

## 2010-12-21 NOTE — Anesthesia Procedure Notes (Signed)
Procedure Name: Intubation Date/Time: 12/21/2010 10:50 AM Performed by: Rossie Muskrat Pre-anesthesia Checklist: Patient identified, Emergency Drugs available, Suction available, Patient being monitored and Timeout performed Patient Re-evaluated:Patient Re-evaluated prior to inductionOxygen Delivery Method: Circle System Utilized Preoxygenation: Pre-oxygenation with 100% oxygen Intubation Type: IV induction Ventilation: Mask ventilation without difficulty and Oral airway inserted - appropriate to patient size Laryngoscope Size: Miller and 2 Grade View: Grade I Number of attempts: 1 Airway Equipment and Method: stylet Placement Confirmation: ETT inserted through vocal cords under direct vision,  breath sounds checked- equal and bilateral and positive ETCO2 Secured at: 20 cm Tube secured with: Tape Dental Injury: Teeth and Oropharynx as per pre-operative assessment

## 2010-12-21 NOTE — Anesthesia Preprocedure Evaluation (Addendum)
Anesthesia Evaluation  Patient identified by MRN, date of birth, ID band Patient awake    Reviewed: Allergy & Precautions, H&P , NPO status , Patient's Chart, lab work & pertinent test results  Airway Mallampati: II  Neck ROM: full    Dental  (+) Edentulous Upper   Pulmonary          Cardiovascular hypertension, + CAD     Neuro/Psych PSYCHIATRIC DISORDERS Anxiety Depression  Neuromuscular disease    GI/Hepatic GERD-  ,  Endo/Other  Diabetes mellitus-  Renal/GU      Musculoskeletal   Abdominal   Peds  Hematology   Anesthesia Other Findings   Reproductive/Obstetrics                          Anesthesia Physical Anesthesia Plan  ASA: III  Anesthesia Plan: General   Post-op Pain Management: MAC Combined w/ Regional for Post-op pain   Induction: Intravenous  Airway Management Planned: Oral ETT  Additional Equipment:   Intra-op Plan:   Post-operative Plan:   Informed Consent: I have reviewed the patients History and Physical, chart, labs and discussed the procedure including the risks, benefits and alternatives for the proposed anesthesia with the patient or authorized representative who has indicated his/her understanding and acceptance.     Plan Discussed with: CRNA and Surgeon  Anesthesia Plan Comments:         Anesthesia Quick Evaluation

## 2010-12-21 NOTE — Transfer of Care (Signed)
Immediate Anesthesia Transfer of Care Note  Patient: Shelia Webb  Procedure(s) Performed:  OPEN REDUCTION INTERNAL (ORIF) FIXATION PATELLA - Left Knee Hamstring Release and Open Reduction Internal Fixation Patella  Patient Location: PACU  Anesthesia Type: General  Level of Consciousness: awake, alert  and oriented  Airway & Oxygen Therapy: Patient Spontanous Breathing and Patient connected to nasal cannula oxygen  Post-op Assessment: Report given to PACU RN, Post -op Vital signs reviewed and stable and Patient moving all extremities  Post vital signs: Reviewed and stable  Complications: No apparent anesthesia complications

## 2010-12-21 NOTE — H&P (Signed)
Shelia Webb is an 77 y.o. female.   Chief Complaint: Displaced left patellar fracture pending skin breakdown with hamstring contractures HPI: Patient is developing a progressive ischemic changes to the skin she is nonambulatory however due to the contractures in the patella fracture patient presents at this time for internal fixation hamstring release.  Past Medical History  Diagnosis Date  . Hypertension   . Diabetes mellitus   . Dysrhythmia     a-fib  . Anemia   . Acute respiratory failure   . Chronic kidney disease     Kidney failure  . Allergic rhinitis   . Osteoarthrosis   . Osteoporosis   . Anxiety   . Depression   . Restless leg syndrome   . GERD (gastroesophageal reflux disease)   . Hypertonic bladder   . Peripheral vascular disease   . Coronary atherosclerosis due to lipid rich plaque     Past Surgical History  Procedure Date  . Colon surgery     has colostomy  . Abdominal hysterectomy   . Appendectomy   . Tonsillectomy   . Cardiac catheterization     bilateral cataract surgery  . Diaphragmatic hernia repair     with gangrene  . Orif patella 12/07/2010    Procedure: OPEN REDUCTION INTERNAL (ORIF) FIXATION PATELLA;  Surgeon: Shelia Mustard, MD;  Location: [facility] OR;  Service: Orthopedics;  Laterality: Left;  Left Knee Hamstring Release and Open Reduction Internal Fixation Patella    History reviewed. No pertinent family history. Social History:  reports that she has never smoked. She has never used smokeless tobacco. She reports that she does not drink alcohol or use illicit drugs.  Allergies:  Allergies  Allergen Reactions  . Penicillins Swelling and Rash    Medications Prior to Admission  Medication Dose Route Frequency Provider Last Rate Last Dose  . clindamycin (CLEOCIN) IVPB 600 mg  600 mg Intravenous 60 min Pre-Op Shelia Mustard, MD       Medications Prior to Admission  Medication Sig Dispense Refill  . Artificial Tear Ointment (LACRI-LUBE OP) Place 1  drop into both eyes at bedtime.        Marland Kitchen aspirin 81 MG tablet Take 81 mg by mouth daily.        . calcium-vitamin D (OSCAL WITH D) 500-200 MG-UNIT per tablet Take 1 tablet by mouth daily.        . Cholecalciferol 50000 UNITS capsule Take 50,000 Units by mouth once a week.        . cycloSPORINE (RESTASIS) 0.05 % ophthalmic emulsion Place 1 drop into both eyes 2 (two) times daily.        . diclofenac sodium (VOLTAREN) 1 % GEL Apply 1 application topically 4 (four) times daily as needed. For left knee pain         . DULoxetine (CYMBALTA) 60 MG capsule Take 60 mg by mouth daily.        . Fluticasone-Salmeterol (ADVAIR) 100-50 MCG/DOSE AEPB Inhale 1 puff into the lungs daily.        . furosemide (LASIX) 20 MG tablet Take 20 mg by mouth daily.        Marland Kitchen gabapentin (NEURONTIN) 400 MG capsule Take 400 mg by mouth at bedtime.        Marland Kitchen HYDROcodone-acetaminophen (NORCO) 7.5-325 MG per tablet Take 1 tablet by mouth every 4 (four) hours as needed. For pain        . insulin glargine (LANTUS) 100 UNIT/ML injection Inject  15 Units into the skin 2 (two) times daily.        . metoprolol succinate (TOPROL-XL) 25 MG 24 hr tablet Take 25 mg by mouth daily.        . Multiple Vitamins-Minerals (MULTIVITAMINS THER. W/MINERALS) TABS Take 1 tablet by mouth daily.        Marland Kitchen omeprazole (PRILOSEC) 20 MG capsule Take 20 mg by mouth daily.        Shelia Webb Glycol-Propyl Glycol (SYSTANE) 0.4-0.3 % GEL Place 1 drop into both eyes 4 (four) times daily.        . polyethylene glycol (MIRALAX / GLYCOLAX) packet Take 17 g by mouth daily as needed. For constipation        . rOPINIRole (REQUIP) 0.25 MG tablet Take 0.25 mg by mouth 2 (two) times daily.        Marland Kitchen senna (SENOKOT) 8.6 MG TABS Take 1 tablet by mouth at bedtime.        Marland Kitchen albuterol (PROVENTIL HFA;VENTOLIN HFA) 108 (90 BASE) MCG/ACT inhaler Inhale 2 puffs into the lungs every 4 (four) hours as needed. For shortness of breath        . benzonatate (TESSALON) 100 MG capsule  Take 100 mg by mouth 3 (three) times daily as needed.        . cetirizine-pseudoephedrine (ZYRTEC-D) 5-120 MG per tablet Take 1 tablet by mouth 2 (two) times daily as needed. For allergies        . hydroxypropyl cellulose (LACRISERT) 5 MG INST 5 mg daily.        Marland Kitchen ipratropium (ATROVENT) 0.02 % nebulizer solution Take 500 mcg by nebulization every 6 (six) hours as needed. For congestion         No results found for this or any previous visit (from the past 48 hour(s)). No results found.  ROS  Blood pressure 178/103, pulse 70, temperature 97.5 F (36.4 C), temperature source Oral, resp. rate 18, SpO2 97.00%. Physical Exam on examination patient has a pending skin breakdown over the patella fracture. Her hamstring contracture keeps her knee in a flexed 90 of flexion.  Assessment/Plan Assessment fixed hamstring contracture of the left knee with patella fracture displaced and pending skin breakdown. Plan will plan for internal fixation patella fracture and release of the hamstring tendons.  Shelia Webb V 12/21/2010, 7:12 AM

## 2010-12-22 LAB — PROTIME-INR: Prothrombin Time: 15.2 seconds (ref 11.6–15.2)

## 2010-12-22 LAB — GLUCOSE, CAPILLARY: Glucose-Capillary: 198 mg/dL — ABNORMAL HIGH (ref 70–99)

## 2010-12-22 MED ORDER — WARFARIN SODIUM 3 MG PO TABS
3.0000 mg | ORAL_TABLET | Freq: Once | ORAL | Status: AC
  Administered 2010-12-22: 3 mg via ORAL
  Filled 2010-12-22: qty 1

## 2010-12-22 NOTE — Progress Notes (Signed)
ANTICOAGULATION CONSULT NOTE - Follow Up Consult  Pharmacy Consult for Coumadin Indication: VTE prophylaxis  Allergies  Allergen Reactions  . Penicillins Swelling and Rash    Vital Signs: Temp: 98.9 F (37.2 C) (12/01 0533) Temp src: Oral (12/01 0533) BP: 148/83 mmHg (12/01 0533) Pulse Rate: 84  (12/01 0802)  Labs:  Basename 12/22/10 0600 12/21/10 0716  HGB -- 14.5  HCT -- 43.7  PLT -- 181  APTT -- --  LABPROT 15.2 --  INR 1.18 --  HEPARINUNFRC -- --  CREATININE -- 0.68  CKTOTAL -- --  CKMB -- --  TROPONINI -- --   Estimated Creatinine Clearance: 43.7 ml/min (by C-G formula based on Cr of 0.68).   Assessment: 77 yo female s/p ORIF for patella fracture to on Coumadin for VTE prophylaxis. INR subtherapeutic, trending up slowly. No bleeding per chart  Goal of Therapy:  INR 2-3   Plan:  Coumadin 3mg  po x 1  F/u INR  Gideon Burstein, Albertha Ghee 12/22/2010,10:52 AM

## 2010-12-22 NOTE — Progress Notes (Signed)
Subjective: Sitting in chair - did not walk before surgery  Objective: Vital signs in last 24 hours: Temp:  [97.4 F (36.3 C)-98.9 F (37.2 C)] 98.9 F (37.2 C) (12/01 0533) Pulse Rate:  [83-96] 84  (12/01 0802) Resp:  [12-22] 15  (12/01 0802) BP: (131-167)/(71-89) 148/83 mmHg (12/01 0533) SpO2:  [94 %-100 %] 100 % (12/01 0802) Weight:  [68.947 kg (152 lb)] 152 lb (68.947 kg) (11/30 1500)  Intake/Output from previous day: 11/30 0701 - 12/01 0700 In: 2100 [I.V.:2000; IV Piggyback:100] Out: 51 [Stool:1; Blood:50] Intake/Output this shift:    Exam:  Neurovascular intact Sensation intact distally Intact pulses distally  Labs:  Basename 12/21/10 0716  HGB 14.5    Basename 12/21/10 0716  WBC 7.4  RBC 5.24*  HCT 43.7  PLT 181    Basename 12/21/10 0716  NA 138  K 3.9  CL 99  CO2 28  BUN 16  CREATININE 0.68  GLUCOSE 228*  CALCIUM 9.7    Basename 12/22/10 0600  LABPT --  INR 1.18    Assessment/Plan: Mobilize - snf likely - continue kimmobilizer   DEAN,GREGORY SCOTT 12/22/2010, 9:22 AM

## 2010-12-22 NOTE — Progress Notes (Addendum)
Physical Therapy Evaluation Patient Details Name: Shelia Webb MRN: [id number] DOB: [birth year] Today's Date: 12/22/2010  Problem List:  Patient Active Problem List  Diagnoses  . COLONIC POLYPS  . DM  . RESTLESS LEG SYNDROME  . DRY EYE SYNDROME  . HYPERTENSION  . CAD  . PERIPHERAL VASCULAR DISEASE  . VENOUS STASIS ULCER  . HEMORRHOIDS, INTERNAL  . ESOPHAGEAL STRICTURE  . GERD  . DIVERTICULOSIS, COLON  . OVERACTIVE BLADDER  . OSTEOARTHRITIS  . UNSTEADY GAIT  . Ventral hernia without mention of obstruction or gangrene    Past Medical History:  Past Medical History  Diagnosis Date  . Hypertension   . Diabetes mellitus   . Dysrhythmia     a-fib  . Anemia   . Acute respiratory failure   . Chronic kidney disease     Kidney failure  . Allergic rhinitis   . Osteoarthrosis   . Osteoporosis   . Anxiety   . Depression   . Restless leg syndrome   . GERD (gastroesophageal reflux disease)   . Hypertonic bladder   . Peripheral vascular disease   . Coronary atherosclerosis due to lipid rich plaque    Past Surgical History:  Past Surgical History  Procedure Date  . Colon surgery     has colostomy  . Abdominal hysterectomy   . Appendectomy   . Tonsillectomy   . Cardiac catheterization     bilateral cataract surgery  . Diaphragmatic hernia repair     with gangrene  . Orif patella 12/07/2010    Procedure: OPEN REDUCTION INTERNAL (ORIF) FIXATION PATELLA;  Surgeon: Nadara Mustard, MD;  Location: [facility] OR;  Service: Orthopedics;  Laterality: Left;  Left Knee Hamstring Release and Open Reduction Internal Fixation Patella    PT Assessment/Plan/Recommendation PT Assessment Clinical Impression Statement: Pt. s/p L patella ORIF and hamstring tendon release, NWB on LLE.  Pt. total care for mobility and ADLs prior to admission and no skilled PT needs identified. PT Recommendation/Assessment: Patent does not need any further PT services;All further PT needs can be met in the next venue  of care PT Problem List: Decreased range of motion;Decreased mobility;Pain No Skilled PT: Patient at baseline level of functioning;Patient will have necessary level of assist by caregiver at discharge PT Recommendation Follow Up Recommendations: 24 hour supervision/assistance;Skilled nursing facility Equipment Recommended: Defer to next venue PT Goals     PT Evaluation Precautions/Restrictions  Precautions Required Braces or Orthoses: Yes Knee Immobilizer: On at all times Restrictions Weight Bearing Restrictions: Yes LLE Weight Bearing: Non weight bearing Other Position/Activity Restrictions: bed to chair transfers only Prior Functioning  Home Living Lives With: Other (Comment) (SNF) Receives Help From: Personal care attendant Type of Home: Skilled Nursing Facility Prior Function Level of Independence: Needs assistance with tranfers;Other (comment) (total care for all mobility, using lift for OOB) Able to Take Stairs?: No Driving: No Vocation: Retired Leisure: Hobbies-no Cognition Cognition Arousal/Alertness: Awake/alert Overall Cognitive Status: Appears within functional limits for tasks assessed Orientation Level: Oriented X4 Sensation/Coordination Sensation Light Touch: Not tested Stereognosis: Not tested Hot/Cold: Not tested Proprioception: Not tested Coordination Gross Motor Movements are Fluid and Coordinated: Not tested Fine Motor Movements are Fluid and Coordinated: Not tested Extremity Assessment RUE Assessment RUE Assessment: Not tested LUE Assessment LUE Assessment: Not tested RLE Assessment RLE Assessment: Not tested LLE Assessment LLE Assessment: Not tested Mobility (including Balance) Bed Mobility Bed Mobility: Yes Supine to Sit: 1: +2 Total assist;Patient percentage (comment);HOB elevated (Comment degrees);With rails (pt. <10%, HOB  15 degrees) Transfers Transfers: Yes Anterior-Posterior Transfer: 1: +2 Total assist;Patient percentage  (comment);To level surface (pt. < 10%) Anterior-Posterior Transfer Details (indicate cue type and reason): pt. with very rigid posture and limited ability to participate Ambulation/Gait Ambulation/Gait: No Stairs: No Wheelchair Mobility Wheelchair Mobility: No  Posture/Postural Control Posture/Postural Control: Postural limitations Postural Limitations: pt. with bil. lower extremity contractures, decreased hip and trunk motion due to immobility and prolonged bedrest Balance Balance Assessed: No Exercise    End of Session PT - End of Session Activity Tolerance: Patient tolerated treatment well Patient left: in chair;with call bell in reach Nurse Communication: Mobility status for transfers;Need for lift equipment General Behavior During Session: [facility] for tasks performed Cognition: [facility] for tasks performed  Feltis, Nicki Reaper 12/22/2010, 9:07 AM Nicki Reaper. Feltis, PT, DPT [phone]

## 2010-12-23 LAB — GLUCOSE, CAPILLARY
Glucose-Capillary: 251 mg/dL — ABNORMAL HIGH (ref 70–99)
Glucose-Capillary: 300 mg/dL — ABNORMAL HIGH (ref 70–99)
Glucose-Capillary: 257 mg/dL — ABNORMAL HIGH (ref 70–99)
Glucose-Capillary: 312 mg/dL — ABNORMAL HIGH (ref 70–99)

## 2010-12-23 MED ORDER — WARFARIN SODIUM 5 MG PO TABS
5.0000 mg | ORAL_TABLET | Freq: Once | ORAL | Status: AC
  Administered 2010-12-23: 5 mg via ORAL
  Filled 2010-12-23: qty 1

## 2010-12-23 NOTE — Progress Notes (Signed)
Pt stable vss Predictably slow to mobilize Will need snf

## 2010-12-23 NOTE — Progress Notes (Signed)
ANTICOAGULATION CONSULT NOTE - Follow Up Consult  Pharmacy Consult for Coumadin  Indication: VTE prophylaxis   Allergies  Allergen Reactions  . Penicillins Swelling and Rash    Vital Signs: Temp: 98.1 F (36.7 C) (12/02 0520) Temp src: Oral (12/02 0520) BP: 120/72 mmHg (12/02 0520) Pulse Rate: 72  (12/02 0520)  Labs:  Basename 12/23/10 0600 12/22/10 0600 12/21/10 0716  HGB -- -- 14.5  HCT -- -- 43.7  PLT -- -- 181  APTT -- -- --  LABPROT 14.9 15.2 --  INR 1.15 1.18 --  HEPARINUNFRC -- -- --  CREATININE -- -- 0.68  CKTOTAL -- -- --  CKMB -- -- --  TROPONINI -- -- --   Estimated Creatinine Clearance: 43.7 ml/min (by C-G formula based on Cr of 0.68).   Assessment:  77 yo female s/p ORIF for patella fracture to on Coumadin for VTE prophylaxis. INR subtherapeutic, decreased slightly from yesterday. No bleeding per chart   Goal of Therapy:  INR 2-3   Plan:  Coumadin 5mg  po x 1  F/u INR  Mordche Hedglin, Albertha Ghee 12/23/2010,10:18 AM

## 2010-12-24 ENCOUNTER — Encounter (HOSPITAL_COMMUNITY): Payer: Self-pay | Admitting: Orthopedic Surgery

## 2010-12-24 LAB — GLUCOSE, CAPILLARY
Glucose-Capillary: 219 mg/dL — ABNORMAL HIGH (ref 70–99)
Glucose-Capillary: 161 mg/dL — ABNORMAL HIGH (ref 70–99)

## 2010-12-24 LAB — PROTIME-INR: Prothrombin Time: 16.2 seconds — ABNORMAL HIGH (ref 11.6–15.2)

## 2010-12-24 MED ORDER — OXYCODONE-ACETAMINOPHEN 5-325 MG PO TABS: 1.0000 | ORAL_TABLET | ORAL | Status: AC | PRN

## 2010-12-24 MED ORDER — WARFARIN SODIUM 5 MG PO TABS
5.0000 mg | ORAL_TABLET | Freq: Once | ORAL | Status: AC
  Administered 2010-12-24: 5 mg via ORAL
  Filled 2010-12-24: qty 1

## 2010-12-24 MED ORDER — HYDROCODONE-ACETAMINOPHEN 5-500 MG PO TABS: 1.0000 | ORAL_TABLET | Freq: Four times a day (QID) | ORAL | Status: AC | PRN

## 2010-12-24 NOTE — Progress Notes (Signed)
ANTICOAGULATION CONSULT NOTE - Follow Up Consult  Pharmacy Consult for Coumadin  Indication: VTE prophylaxis   Allergies  Allergen Reactions  . Penicillins Swelling and Rash    Vital Signs: Temp: 98.5 F (36.9 C) (12/03 0625) Temp src: Oral (12/03 0625) BP: 121/71 mmHg (12/03 0625) Pulse Rate: 75  (12/03 0625)  Labs:  Basename 12/24/10 0500 12/23/10 0600 12/22/10 0600  HGB -- -- --  HCT -- -- --  PLT -- -- --  APTT -- -- --  LABPROT 16.2* 14.9 15.2  INR 1.27 1.15 1.18  HEPARINUNFRC -- -- --  CREATININE -- -- --  CKTOTAL -- -- --  CKMB -- -- --  TROPONINI -- -- --   Estimated Creatinine Clearance: 43.7 ml/min (by C-G formula based on Cr of 0.68).   Assessment:  77 yo female s/p ORIF for patella fracture to on Coumadin for VTE prophylaxis. INR up to 1.27. Called Dr. Lajoyce Corners who does not wish to discharge patient on Coumadin.  Goal of Therapy:  INR 2-3   Plan:  Coumadin 5mg  po x 1 again today while inpatient unless the order is d/c'd. D/c summary noted by Dr. Lajoyce Corners does not include Coumadin after hospitalization. F/u INR in am if still here.  Shelia Webb 12/24/2010,12:00 PM

## 2010-12-24 NOTE — Discharge Summary (Signed)
Physician Discharge Summary  Patient ID: HANH KERTESZ MRN: [id number] DOB/AGE: 75-Jul-1923 75 y.o.  Admit date: 12/21/2010 Discharge date: 12/24/2010  Admission Diagnoses: Left patella fracture Hamstring contracture Discharge Diagnoses:  ORIF patella fracture Release hamstrings left knee  Discharged Condition: stable  Hospital Course: unremarkable, patient underwent surgery on 11/30, post operative she was stable, plan for return to SNF  Consults: none  Significant Diagnostic Studies: labs: stable  Treatments: surgery: ORIF left patella, release of hamstrings left knee  Discharge Exam: Blood pressure 113/71, pulse 88, temperature 98.2 F (36.8 C), temperature source Oral, resp. rate 20, height 5' (1.524 m), weight 68.947 kg (152 lb), SpO2 96.00%. General appearance: alert Incision/Wound:  Disposition: Home or Self Care  Discharge Orders    Future Orders Please Complete By Expires   Diet - low sodium heart healthy      Call MD / Call 911      Comments:   If you experience chest pain or shortness of breath, CALL 911 and be transported to the hospital emergency room.  If you develope a fever above 101 F, pus (white drainage) or increased drainage or redness at the wound, or calf pain, call your surgeon's office.   Constipation Prevention      Comments:   Drink plenty of fluids.  Prune juice may be helpful.  You may use a stool softener, such as Colace (over the counter) 100 mg twice a day.  Use MiraLax (over the counter) for constipation as needed.   Increase activity slowly as tolerated      Weight Bearing as taught in Physical Therapy      Comments:   Non weight bearing left leg     Current Discharge Medication List    START taking these medications   Details  oxyCODONE-acetaminophen (ROXICET) 5-325 MG per tablet Take 1 tablet by mouth every 4 (four) hours as needed for pain. Qty: 60 tablet, Refills: 0      CONTINUE these medications which have NOT CHANGED   Details  Artificial Tear Ointment (LACRI-LUBE OP) Place 1 drop into both eyes at bedtime.      aspirin 81 MG tablet Take 81 mg by mouth daily.      calcium-vitamin D (OSCAL WITH D) 500-200 MG-UNIT per tablet Take 1 tablet by mouth daily.      Cholecalciferol 50000 UNITS capsule Take 50,000 Units by mouth once a week.      cycloSPORINE (RESTASIS) 0.05 % ophthalmic emulsion Place 1 drop into both eyes 2 (two) times daily.      diclofenac sodium (VOLTAREN) 1 % GEL Apply 1 application topically 4 (four) times daily as needed. For left knee pain       DULoxetine (CYMBALTA) 60 MG capsule Take 60 mg by mouth daily.      Fluticasone-Salmeterol (ADVAIR) 100-50 MCG/DOSE AEPB Inhale 1 puff into the lungs daily.      furosemide (LASIX) 20 MG tablet Take 20 mg by mouth daily.      gabapentin (NEURONTIN) 400 MG capsule Take 400 mg by mouth at bedtime.      HYDROcodone-acetaminophen (NORCO) 7.5-325 MG per tablet Take 1 tablet by mouth every 4 (four) hours as needed. For pain      insulin glargine (LANTUS) 100 UNIT/ML injection Inject 15 Units into the skin 2 (two) times daily.      metoprolol succinate (TOPROL-XL) 25 MG 24 hr tablet Take 25 mg by mouth daily.      Multiple Vitamins-Minerals (MULTIVITAMINS THER. W/MINERALS)  TABS Take 1 tablet by mouth daily.      omeprazole (PRILOSEC) 20 MG capsule Take 20 mg by mouth daily.      Polyethyl Glycol-Propyl Glycol (SYSTANE) 0.4-0.3 % GEL Place 1 drop into both eyes 4 (four) times daily.      polyethylene glycol (MIRALAX / GLYCOLAX) packet Take 17 g by mouth daily as needed. For constipation      rOPINIRole (REQUIP) 0.25 MG tablet Take 0.25 mg by mouth 2 (two) times daily.      senna (SENOKOT) 8.6 MG TABS Take 1 tablet by mouth at bedtime.      albuterol (PROVENTIL HFA;VENTOLIN HFA) 108 (90 BASE) MCG/ACT inhaler Inhale 2 puffs into the lungs every 4 (four) hours as needed. For shortness of breath      benzonatate (TESSALON) 100 MG capsule  Take 100 mg by mouth 3 (three) times daily as needed.      cetirizine-pseudoephedrine (ZYRTEC-D) 5-120 MG per tablet Take 1 tablet by mouth 2 (two) times daily as needed. For allergies      hydroxypropyl cellulose (LACRISERT) 5 MG INST 5 mg daily.      ipratropium (ATROVENT) 0.02 % nebulizer solution Take 500 mcg by nebulization every 6 (six) hours as needed. For congestion        Follow-up Information    Follow up with Bennie Chirico V, MD in 2 weeks.   Contact information:   [address] [place] [address] [phone]          Signed: Nadara Mustard 12/24/2010, 6:36 AM

## 2010-12-24 NOTE — Progress Notes (Signed)
Met with patient and contacted her son via phone- he is in [place], [phone]) and requesting MD call him with updates. FL2 placed in shadow chart in [facility] for your review and signature. Will proceed with plans for return to  SNF search at d/c. Reece Levy, MSW, [facility] [phone]

## 2010-12-25 LAB — GLUCOSE, CAPILLARY

## 2010-12-25 MED ORDER — WARFARIN SODIUM 5 MG PO TABS
5.0000 mg | ORAL_TABLET | Freq: Once | ORAL | Status: DC
  Filled 2010-12-25: qty 1

## 2010-12-25 NOTE — Progress Notes (Signed)
Patient for d/c back to SNF bed at [facility] today.Contacted son and SNF of plans- will plan transfer via EMS> Reece Levy, MSW, [employer] [phone]

## 2010-12-25 NOTE — Progress Notes (Signed)
ANTICOAGULATION CONSULT NOTE - Follow Up Consult  Pharmacy Consult for Coumadin  Indication: VTE prophylaxis   Allergies  Allergen Reactions  . Penicillins Swelling and Rash    Vital Signs: Temp: 98.1 F (36.7 C) (12/04 0702) Temp src: Oral (12/04 0702) BP: 141/62 mmHg (12/04 0702) Pulse Rate: 82  (12/04 0702)  Labs:  Basename 12/25/10 0535 12/24/10 0500 12/23/10 0600  HGB -- -- --  HCT -- -- --  PLT -- -- --  APTT -- -- --  LABPROT 17.7* 16.2* 14.9  INR 1.43 1.27 1.15  HEPARINUNFRC -- -- --  CREATININE -- -- --  CKTOTAL -- -- --  CKMB -- -- --  TROPONINI -- -- --   Estimated Creatinine Clearance: 43.7 ml/min (by C-G formula based on Cr of 0.68).   Assessment:  Shelia yo female s/p ORIF for patella fracture on Coumadin for VTE prophylaxis. INR up to 1.43. No recent CBC or BMET. CBGs uncontrolled (161-096) Called Dr. Lajoyce Corners 12/3 who does not wish to discharge patient on Coumadin.  Goal of Therapy:  INR 2-3   Plan:  Coumadin 5mg  po x 1 again today while inpatient unless the order is d/c'd. D/c summary noted by Dr. Lajoyce Corners does not include Coumadin after hospitalization. F/u INR, CBC, BMET in am if still here.  Shelia Webb, Shelia Webb 12/25/2010,10:08 AM

## 2010-12-25 NOTE — Progress Notes (Signed)
Patient ID: Shelia Webb, female   DOB: 1921-12-15, 77 y.o.   MRN: [id number] FL-2 signed, plan for d/c to SNF

## 2010-12-26 LAB — GLUCOSE, CAPILLARY: Glucose-Capillary: 247 mg/dL — ABNORMAL HIGH (ref 70–99)

## 2011-02-15 ENCOUNTER — Inpatient Hospital Stay (HOSPITAL_COMMUNITY)
Admission: EM | Admit: 2011-02-15 | Discharge: 2011-02-18 | DRG: 069 | Disposition: A | Discharge status: Skilled Nursing Facility | Payer: Medicare Other | Attending: Internal Medicine | Admitting: Internal Medicine

## 2011-02-15 ENCOUNTER — Other Ambulatory Visit: Payer: Self-pay

## 2011-02-15 ENCOUNTER — Emergency Department (HOSPITAL_COMMUNITY): Payer: Medicare Other

## 2011-02-15 ENCOUNTER — Encounter (HOSPITAL_COMMUNITY): Payer: Self-pay | Admitting: Emergency Medicine

## 2011-02-15 DIAGNOSIS — K222 Esophageal obstruction: Secondary | ICD-10-CM

## 2011-02-15 DIAGNOSIS — N189 Chronic kidney disease, unspecified: Secondary | ICD-10-CM | POA: Diagnosis present

## 2011-02-15 DIAGNOSIS — N390 Urinary tract infection, site not specified: Secondary | ICD-10-CM

## 2011-02-15 DIAGNOSIS — M1990 Unspecified osteoarthritis, unspecified site: Secondary | ICD-10-CM

## 2011-02-15 DIAGNOSIS — I4891 Unspecified atrial fibrillation: Secondary | ICD-10-CM | POA: Diagnosis present

## 2011-02-15 DIAGNOSIS — I129 Hypertensive chronic kidney disease with stage 1 through stage 4 chronic kidney disease, or unspecified chronic kidney disease: Secondary | ICD-10-CM | POA: Diagnosis present

## 2011-02-15 DIAGNOSIS — K648 Other hemorrhoids: Secondary | ICD-10-CM

## 2011-02-15 DIAGNOSIS — H04129 Dry eye syndrome of unspecified lacrimal gland: Secondary | ICD-10-CM

## 2011-02-15 DIAGNOSIS — K5730 Diverticulosis of large intestine without perforation or abscess without bleeding: Secondary | ICD-10-CM

## 2011-02-15 DIAGNOSIS — G2581 Restless legs syndrome: Secondary | ICD-10-CM

## 2011-02-15 DIAGNOSIS — I739 Peripheral vascular disease, unspecified: Secondary | ICD-10-CM

## 2011-02-15 DIAGNOSIS — E119 Type 2 diabetes mellitus without complications: Secondary | ICD-10-CM

## 2011-02-15 DIAGNOSIS — G459 Transient cerebral ischemic attack, unspecified: Principal | ICD-10-CM

## 2011-02-15 DIAGNOSIS — I2510 Atherosclerotic heart disease of native coronary artery without angina pectoris: Secondary | ICD-10-CM

## 2011-02-15 DIAGNOSIS — R269 Unspecified abnormalities of gait and mobility: Secondary | ICD-10-CM

## 2011-02-15 DIAGNOSIS — L97909 Non-pressure chronic ulcer of unspecified part of unspecified lower leg with unspecified severity: Secondary | ICD-10-CM

## 2011-02-15 DIAGNOSIS — F341 Dysthymic disorder: Secondary | ICD-10-CM | POA: Diagnosis present

## 2011-02-15 DIAGNOSIS — D126 Benign neoplasm of colon, unspecified: Secondary | ICD-10-CM

## 2011-02-15 DIAGNOSIS — Z66 Do not resuscitate: Secondary | ICD-10-CM | POA: Diagnosis present

## 2011-02-15 DIAGNOSIS — H3530 Unspecified macular degeneration: Secondary | ICD-10-CM | POA: Diagnosis present

## 2011-02-15 DIAGNOSIS — IMO0001 Reserved for inherently not codable concepts without codable children: Secondary | ICD-10-CM | POA: Diagnosis present

## 2011-02-15 DIAGNOSIS — N318 Other neuromuscular dysfunction of bladder: Secondary | ICD-10-CM

## 2011-02-15 DIAGNOSIS — R200 Anesthesia of skin: Secondary | ICD-10-CM

## 2011-02-15 DIAGNOSIS — I10 Essential (primary) hypertension: Secondary | ICD-10-CM

## 2011-02-15 DIAGNOSIS — K219 Gastro-esophageal reflux disease without esophagitis: Secondary | ICD-10-CM

## 2011-02-15 DIAGNOSIS — M810 Age-related osteoporosis without current pathological fracture: Secondary | ICD-10-CM | POA: Diagnosis present

## 2011-02-15 DIAGNOSIS — Z79899 Other long term (current) drug therapy: Secondary | ICD-10-CM

## 2011-02-15 DIAGNOSIS — D649 Anemia, unspecified: Secondary | ICD-10-CM | POA: Diagnosis present

## 2011-02-15 HISTORY — DX: Do not resuscitate: Z66

## 2011-02-15 LAB — CARDIAC PANEL(CRET KIN+CKTOT+MB+TROPI)
CK, MB: 2 ng/mL (ref 0.3–4.0)
Total CK: 42 U/L (ref 7–177)
Troponin I: <0.3 ng/mL (ref <0.30)

## 2011-02-15 LAB — COMPREHENSIVE METABOLIC PANEL
ALT: 14 U/L (ref 0–35)
Albumin: 3.2 g/dL — ABNORMAL LOW (ref 3.5–5.2)
Calcium: 9.1 mg/dL (ref 8.4–10.5)
GFR calc Af Amer: >90 mL/min (ref >90)
Glucose, Bld: 303 mg/dL — ABNORMAL HIGH (ref 70–99)
Sodium: 134 mEq/L — ABNORMAL LOW (ref 135–145)
Total Protein: 7.5 g/dL (ref 6.0–8.3)

## 2011-02-15 LAB — PROTIME-INR
INR: 0.99 (ref 0.00–1.49)
Prothrombin Time: 13.3 seconds (ref 11.6–15.2)

## 2011-02-15 LAB — DIFFERENTIAL
Basophils Absolute: 0 10*3/uL (ref 0.0–0.1)
Basophils Relative: 0 % (ref 0–1)
Eosinophils Absolute: 0.2 10*3/uL (ref 0.0–0.7)
Eosinophils Relative: 2 % (ref 0–5)
Lymphs Abs: 2.2 10*3/uL (ref 0.7–4.0)

## 2011-02-15 LAB — URINALYSIS, ROUTINE W REFLEX MICROSCOPIC
Bilirubin Urine: NEGATIVE
Nitrite: NEGATIVE
Specific Gravity, Urine: 1.015 (ref 1.005–1.030)
pH: 5 (ref 5.0–8.0)

## 2011-02-15 LAB — CBC
MCH: 26.8 pg (ref 26.0–34.0)
MCHC: 33.6 g/dL (ref 30.0–36.0)
MCV: 79.7 fL (ref 78.0–100.0)
Platelets: 209 10*3/uL (ref 150–400)
RDW: 14.9 % (ref 11.5–15.5)

## 2011-02-15 LAB — POCT I-STAT TROPONIN I: Troponin i, poc: 0 ng/mL (ref 0.00–0.08)

## 2011-02-15 LAB — URINE MICROSCOPIC-ADD ON

## 2011-02-15 MED ORDER — THERA M PLUS PO TABS
1.0000 | ORAL_TABLET | Freq: Every day | ORAL | Status: DC
  Administered 2011-02-15 – 2011-02-18 (×4): 1 via ORAL
  Filled 2011-02-15 (×4): qty 1

## 2011-02-15 MED ORDER — POLYVINYL ALCOHOL 1.4 % OP SOLN
1.0000 [drp] | Freq: Four times a day (QID) | OPHTHALMIC | Status: DC
  Administered 2011-02-15 – 2011-02-18 (×12): 1 [drp] via OPHTHALMIC
  Filled 2011-02-15: qty 15

## 2011-02-15 MED ORDER — DICLOFENAC SODIUM 1 % TD GEL
1.0000 "application " | Freq: Four times a day (QID) | TRANSDERMAL | Status: DC | PRN
  Filled 2011-02-15: qty 100

## 2011-02-15 MED ORDER — ROPINIROLE HCL 0.25 MG PO TABS
0.2500 mg | ORAL_TABLET | Freq: Two times a day (BID) | ORAL | Status: DC
  Administered 2011-02-15 – 2011-02-18 (×6): 0.25 mg via ORAL
  Filled 2011-02-15 (×7): qty 1

## 2011-02-15 MED ORDER — ASPIRIN 81 MG PO CHEW
324.0000 mg | CHEWABLE_TABLET | Freq: Once | ORAL | Status: AC
  Administered 2011-02-15: 324 mg via ORAL
  Filled 2011-02-15: qty 1

## 2011-02-15 MED ORDER — FLUTICASONE-SALMETEROL 100-50 MCG/DOSE IN AEPB
1.0000 | INHALATION_SPRAY | Freq: Every day | RESPIRATORY_TRACT | Status: DC
  Administered 2011-02-16 – 2011-02-18 (×2): 1 via RESPIRATORY_TRACT
  Filled 2011-02-15: qty 14

## 2011-02-15 MED ORDER — POLYETHYL GLYCOL-PROPYL GLYCOL 0.4-0.3 % OP GEL: 1.0000 [drp] | Freq: Four times a day (QID) | OPHTHALMIC | Status: DC

## 2011-02-15 MED ORDER — OXYCODONE-ACETAMINOPHEN 5-325 MG PO TABS
1.0000 | ORAL_TABLET | Freq: Four times a day (QID) | ORAL | Status: DC | PRN
  Administered 2011-02-16 – 2011-02-18 (×2): 1 via ORAL
  Filled 2011-02-15 (×2): qty 1

## 2011-02-15 MED ORDER — VITAMIN D (ERGOCALCIFEROL) 1.25 MG (50000 UNIT) PO CAPS
50000.0000 [IU] | ORAL_CAPSULE | ORAL | Status: DC
  Administered 2011-02-15: 50000 [IU] via ORAL
  Filled 2011-02-15: qty 1

## 2011-02-15 MED ORDER — ENOXAPARIN SODIUM 40 MG/0.4ML ~~LOC~~ SOLN
40.0000 mg | Freq: Every day | SUBCUTANEOUS | Status: DC
  Administered 2011-02-15 – 2011-02-17 (×3): 40 mg via SUBCUTANEOUS
  Filled 2011-02-15 (×4): qty 0.4

## 2011-02-15 MED ORDER — DEXTROSE 5 % IV SOLN
1.0000 g | INTRAVENOUS | Status: DC
  Administered 2011-02-16 – 2011-02-18 (×3): 1 g via INTRAVENOUS
  Filled 2011-02-15 (×4): qty 10

## 2011-02-15 MED ORDER — METOPROLOL SUCCINATE ER 25 MG PO TB24
25.0000 mg | ORAL_TABLET | Freq: Every day | ORAL | Status: DC
  Administered 2011-02-15 – 2011-02-18 (×4): 25 mg via ORAL
  Filled 2011-02-15 (×4): qty 1

## 2011-02-15 MED ORDER — SENNA 8.6 MG PO TABS
1.0000 | ORAL_TABLET | Freq: Every day | ORAL | Status: DC
  Administered 2011-02-15 – 2011-02-17 (×3): 8.6 mg via ORAL
  Filled 2011-02-15 (×4): qty 1

## 2011-02-15 MED ORDER — CALCIUM CARBONATE-VITAMIN D 500-200 MG-UNIT PO TABS
1.0000 | ORAL_TABLET | Freq: Every day | ORAL | Status: DC
  Administered 2011-02-15 – 2011-02-18 (×4): 1 via ORAL
  Filled 2011-02-15 (×4): qty 1

## 2011-02-15 MED ORDER — DEXTROSE 5 % IV SOLN
1.0000 g | Freq: Once | INTRAVENOUS | Status: AC
  Administered 2011-02-15: 1 g via INTRAVENOUS
  Filled 2011-02-15: qty 10

## 2011-02-15 MED ORDER — ACETAMINOPHEN 325 MG PO TABS
650.0000 mg | ORAL_TABLET | ORAL | Status: DC | PRN
  Administered 2011-02-17: 650 mg via ORAL
  Filled 2011-02-15: qty 2

## 2011-02-15 MED ORDER — PANTOPRAZOLE SODIUM 40 MG PO TBEC
40.0000 mg | DELAYED_RELEASE_TABLET | Freq: Every day | ORAL | Status: DC
  Administered 2011-02-15 – 2011-02-18 (×4): 40 mg via ORAL
  Filled 2011-02-15 (×3): qty 1

## 2011-02-15 MED ORDER — ASPIRIN 325 MG PO TABS
325.0000 mg | ORAL_TABLET | Freq: Every day | ORAL | Status: DC
  Administered 2011-02-16 – 2011-02-18 (×3): 325 mg via ORAL
  Filled 2011-02-15 (×3): qty 1

## 2011-02-15 MED ORDER — CYCLOSPORINE 0.05 % OP EMUL
1.0000 [drp] | Freq: Two times a day (BID) | OPHTHALMIC | Status: DC
  Administered 2011-02-15 – 2011-02-16 (×3): 1 [drp] via OPHTHALMIC
  Administered 2011-02-17: 11:00:00 via OPHTHALMIC
  Administered 2011-02-17 – 2011-02-18 (×2): 1 [drp] via OPHTHALMIC
  Filled 2011-02-15 (×7): qty 1

## 2011-02-15 MED ORDER — DULOXETINE HCL 60 MG PO CPEP
60.0000 mg | ORAL_CAPSULE | Freq: Every day | ORAL | Status: DC
  Administered 2011-02-15 – 2011-02-18 (×4): 60 mg via ORAL
  Filled 2011-02-15 (×4): qty 1

## 2011-02-15 MED ORDER — GADOBENATE DIMEGLUMINE 529 MG/ML IV SOLN
15.0000 mL | Freq: Once | INTRAVENOUS | Status: AC | PRN
  Administered 2011-02-15: 15 mL via INTRAVENOUS

## 2011-02-15 MED ORDER — FUROSEMIDE 20 MG PO TABS
20.0000 mg | ORAL_TABLET | Freq: Every day | ORAL | Status: DC
  Administered 2011-02-15 – 2011-02-18 (×4): 20 mg via ORAL
  Filled 2011-02-15 (×4): qty 1

## 2011-02-15 MED ORDER — GABAPENTIN 400 MG PO CAPS
400.0000 mg | ORAL_CAPSULE | Freq: Every day | ORAL | Status: DC
  Administered 2011-02-15 – 2011-02-17 (×3): 400 mg via ORAL
  Filled 2011-02-15 (×4): qty 1

## 2011-02-15 MED ORDER — GUAIFENESIN ER 600 MG PO TB12
600.0000 mg | ORAL_TABLET | Freq: Two times a day (BID) | ORAL | Status: DC
  Administered 2011-02-15 – 2011-02-18 (×6): 600 mg via ORAL
  Filled 2011-02-15 (×7): qty 1

## 2011-02-15 MED ORDER — POLYETHYLENE GLYCOL 3350 17 G PO PACK
17.0000 g | PACK | Freq: Every day | ORAL | Status: DC | PRN
  Filled 2011-02-15: qty 1

## 2011-02-15 NOTE — Consult Note (Signed)
Referring Physician: Dr. Waymon Amato    Chief Complaint: Left-sided numbness and weakness  HPI: Shelia Webb is an 78 y.o. female a history of atrial fibrillation, hypertension, diabetes mellitus and chronic kidney disease, presenting with acute onset of weakness and numbness involving left side. Patient was well early this morning. She apparently took a nap after breakfast and woke up with the above deficits. No previous history of stroke or TIA. She has not been on anticoagulation. Symptoms have resolved except for minimal subjective numbness involving her left upper extremity. NIH stroke score at this point is 0. She has not been on antiplatelet therapy including no aspirin. CT scan of her head showed no acute intracranial abnormality. MRI of the brain has been performed. Results are pending.  LSN: [id number] tPA Given: No MRankin: 1  Past Medical History  Diagnosis Date  . Hypertension   . Diabetes mellitus   . Dysrhythmia     a-fib  . Anemia   . Acute respiratory failure   . Chronic kidney disease     Kidney failure  . Allergic rhinitis   . Osteoarthrosis   . Osteoporosis   . Anxiety   . Depression   . Restless leg syndrome   . GERD (gastroesophageal reflux disease)   . Hypertonic bladder   . Peripheral vascular disease   . Coronary atherosclerosis due to lipid rich plaque   . DNR (do not resuscitate)     Past Surgical History  Procedure Date  . Colon surgery     has colostomy  . Abdominal hysterectomy   . Appendectomy   . Tonsillectomy   . Cardiac catheterization     bilateral cataract surgery  . Diaphragmatic hernia repair     with gangrene  . Orif patella 12/07/2010    Procedure: OPEN REDUCTION INTERNAL (ORIF) FIXATION PATELLA;  Surgeon: Nadara Mustard, MD;  Location: [facility] OR;  Service: Orthopedics;  Laterality: Left;  Left Knee Hamstring Release and Open Reduction Internal Fixation Patella  . Orif patella 12/21/2010    Procedure: OPEN REDUCTION INTERNAL (ORIF) FIXATION  PATELLA;  Surgeon: Nadara Mustard, MD;  Location: [facility] OR;  Service: Orthopedics;  Laterality: Left;  Left Knee Hamstring Release and Open Reduction Internal Fixation Patella    History reviewed. No pertinent family history.   Medications:  Prior to Admission:  Prescriptions prior to admission  Medication Sig Dispense Refill  . Artificial Tear Ointment (LACRI-LUBE OP) Place 1 drop into both eyes at bedtime.        . calcium-vitamin D (OSCAL WITH D) 500-200 MG-UNIT per tablet Take 1 tablet by mouth daily.        . cycloSPORINE (RESTASIS) 0.05 % ophthalmic emulsion Place 1 drop into both eyes 2 (two) times daily.        . DULoxetine (CYMBALTA) 60 MG capsule Take 60 mg by mouth daily.        . Fluticasone-Salmeterol (ADVAIR) 100-50 MCG/DOSE AEPB Inhale 1 puff into the lungs daily.        . furosemide (LASIX) 20 MG tablet Take 20 mg by mouth daily.        Marland Kitchen gabapentin (NEURONTIN) 400 MG capsule Take 400 mg by mouth at bedtime.        Marland Kitchen guaiFENesin (MUCINEX) 600 MG 12 hr tablet Take 600 mg by mouth 2 (two) times daily.      . hydroxypropyl cellulose (LACRISERT) 5 MG INST 5 mg daily.        Marland Kitchen ipratropium (ATROVENT) 0.02 %  nebulizer solution Take 500 mcg by nebulization every 6 (six) hours as needed. For congestion      . metoprolol succinate (TOPROL-XL) 25 MG 24 hr tablet Take 25 mg by mouth daily.        . Multiple Vitamins-Minerals (MULTIVITAMINS THER. W/MINERALS) TABS Take 1 tablet by mouth daily.        Marland Kitchen omeprazole (PRILOSEC) 20 MG capsule Take 20 mg by mouth daily.        Marland Kitchen oxyCODONE-acetaminophen (PERCOCET) 5-325 MG per tablet Take 1 tablet by mouth every 6 (six) hours as needed. For pain      . Polyethyl Glycol-Propyl Glycol (SYSTANE) 0.4-0.3 % GEL Place 1 drop into both eyes 4 (four) times daily.        . polyethylene glycol (MIRALAX / GLYCOLAX) packet Take 17 g by mouth daily as needed. For constipation        . rOPINIRole (REQUIP) 0.25 MG tablet Take 0.25 mg by mouth 2 (two) times daily.         Marland Kitchen senna (SENOKOT) 8.6 MG TABS Take 1 tablet by mouth at bedtime.        . Vitamin D, Ergocalciferol, (DRISDOL) 50000 UNITS CAPS Take 50,000 Units by mouth every 7 (seven) days. mondays      . diclofenac sodium (VOLTAREN) 1 % GEL Apply 1 application topically 4 (four) times daily as needed. For left knee pain            Physical Examination: Blood pressure 149/82, pulse 81, temperature 97.8 F (36.6 C), temperature source Oral, resp. rate 22, height 5' (1.524 m), weight 71.2 kg (156 lb 15.5 oz), SpO2 95.00%.  Neurologic Examination: Mental Status: Alert, oriented, thought content appropriate.  Speech fluent without evidence of aphasia. Able to follow 3 step commands without difficulty. Cranial Nerves: II-Visual fields normal. III/IV/VI-Extraocular movements were full and conjugate.  Pupils reacted normally to light. V/VII-no facial numbness and no facial weakness VIII-grossly intact X-essentially normal speech, consistent with edentulous state XII-midline tongue extension Motor: Normal strength of upper and lower extremities including no drift against gravity Sensory:  light touch intact throughout, bilaterally Deep Tendon Reflexes: Absent at knees and ankles Plantars: Downgoing bilaterally Cerebellar: Normal finger-to-nose. Carotid auscultation: Normal  Assessment: 1. 78 y.o. female with transient left-sided weakness as well as resolving numbness. TIA is likely. However, acute right cerebral infarction cannot be ruled out. 2. Atrial fibrillation, currently on no anticoagulation.  Stroke Risk Factors - atrial fibrillation, diabetes mellitus and hypertension  Plan: 1. HgbA1c, fasting lipid panel 2. MRA  of the brain without contrast 3. PT consult, OT consult, Speech consult 4. Echocardiogram 5. Carotid dopplers 6. Prophylactic therapy-Antiplatelet med: Aspirin - dose 325/day 7. Risk factor modification 8. Telemetry monitoring  C.R. Roseanne Reno, MD [facility]  02/15/2011, 8:04 PM

## 2011-02-15 NOTE — ED Notes (Signed)
Patient transported to MRI 

## 2011-02-15 NOTE — ED Notes (Signed)
Per ems, pt from [facility]. Awoke with headache and left facial, arm, and leg numbness. States progressively gotten better. Stroke scale negative per facility md and ems. Presents with slight unequal grips with right>left. No other deficits noted. Pt in no acute distress.

## 2011-02-15 NOTE — H&P (Signed)
PCP:   Terald Sleeper, MD, MD   Chief Complaint:  Left-sided weakness and numbness  HPI: 78 year old Caucasian female patient, resident of [name], with history of hypertension, diabetes mellitus, chronic kidney disease, atrial fibrillation who is not on Coumadin, coronary artery disease, peripheral artery disease, osteoarthritis, previous Hartmann procedure for perforated diverticulitis was sent from the nursing facility do to above complaints. Patient is right-handed and has been on ambulance and wheelchair mobile for approximately 3 years. She indicates that she was well this morning when she woke up and had her breakfast. After breakfast she took a nap and when she woke up she noticed left sided body and limb numbness and weakness with associated numbness of left side of her face. She denies slurred speech. She had a moderate headache. She denies chest pain, palpitations or dyspnea. She cannot recollect the exact time of this. She indicates that the symptoms lasted for a couple of hours and then resolved spontaneously only to return. Currently she complains of numbness in the left hand but denies weakness. She denies prior history of similar symptoms or stroke or TIA. CT scan of the head in the emergency department was negative for stroke. Urine analysis was suggestive of urinary tract infection. Patient was given and has tolerated intravenous Rocephin. She has passed the bedside swallow screen. She received a dose of aspirin 325 mg. The [facility] hospitalist were asked to admit for further evaluation and management.  Past Medical History: Past Medical History  Diagnosis Date  . Hypertension   . Diabetes mellitus   . Dysrhythmia     a-fib  . Anemia   . Acute respiratory failure   . Chronic kidney disease     Kidney failure  . Allergic rhinitis   . Osteoarthrosis   . Osteoporosis   . Anxiety   . Depression   . Restless leg syndrome   . GERD  (gastroesophageal reflux disease)   . Hypertonic bladder   . Peripheral vascular disease   . Coronary atherosclerosis due to lipid rich plaque   . DNR (do not resuscitate)     Past Surgical History: Past Surgical History  Procedure Date  . Colon surgery     has colostomy  . Abdominal hysterectomy   . Appendectomy   . Tonsillectomy   . Cardiac catheterization     bilateral cataract surgery  . Diaphragmatic hernia repair     with gangrene  . Orif patella 12/07/2010    Procedure: OPEN REDUCTION INTERNAL (ORIF) FIXATION PATELLA;  Surgeon: Nadara Mustard, MD;  Location: [facility] OR;  Service: Orthopedics;  Laterality: Left;  Left Knee Hamstring Release and Open Reduction Internal Fixation Patella  . Orif patella 12/21/2010    Procedure: OPEN REDUCTION INTERNAL (ORIF) FIXATION PATELLA;  Surgeon: Nadara Mustard, MD;  Location: [facility] OR;  Service: Orthopedics;  Laterality: Left;  Left Knee Hamstring Release and Open Reduction Internal Fixation Patella    Allergies:   Allergies  Allergen Reactions  . Penicillins Swelling and Rash    Medications: Prior to Admission medications   Medication Sig Start Date End Date Taking? Authorizing Provider  Artificial Tear Ointment (LACRI-LUBE OP) Place 1 drop into both eyes at bedtime.     Yes Historical Provider, MD  calcium-vitamin D (OSCAL WITH D) 500-200 MG-UNIT per tablet Take 1 tablet by mouth daily.     Yes Historical Provider, MD  cycloSPORINE (RESTASIS) 0.05 % ophthalmic emulsion Place 1 drop into both eyes 2 (two) times daily.  Yes Historical Provider, MD  DULoxetine (CYMBALTA) 60 MG capsule Take 60 mg by mouth daily.     Yes Historical Provider, MD  Fluticasone-Salmeterol (ADVAIR) 100-50 MCG/DOSE AEPB Inhale 1 puff into the lungs daily.     Yes Historical Provider, MD  furosemide (LASIX) 20 MG tablet Take 20 mg by mouth daily.     Yes Historical Provider, MD  gabapentin (NEURONTIN) 400 MG capsule Take 400 mg by mouth at bedtime.     Yes  Historical Provider, MD  guaiFENesin (MUCINEX) 600 MG 12 hr tablet Take 600 mg by mouth 2 (two) times daily.   Yes Historical Provider, MD  hydroxypropyl cellulose (LACRISERT) 5 MG INST 5 mg daily.     Yes Historical Provider, MD  ipratropium (ATROVENT) 0.02 % nebulizer solution Take 500 mcg by nebulization every 6 (six) hours as needed. For congestion   Yes Historical Provider, MD  metoprolol succinate (TOPROL-XL) 25 MG 24 hr tablet Take 25 mg by mouth daily.     Yes Historical Provider, MD  Multiple Vitamins-Minerals (MULTIVITAMINS THER. W/MINERALS) TABS Take 1 tablet by mouth daily.     Yes Historical Provider, MD  omeprazole (PRILOSEC) 20 MG capsule Take 20 mg by mouth daily.     Yes Historical Provider, MD  oxyCODONE-acetaminophen (PERCOCET) 5-325 MG per tablet Take 1 tablet by mouth every 6 (six) hours as needed. For pain   Yes Historical Provider, MD  Polyethyl Glycol-Propyl Glycol (SYSTANE) 0.4-0.3 % GEL Place 1 drop into both eyes 4 (four) times daily.     Yes Historical Provider, MD  polyethylene glycol (MIRALAX / GLYCOLAX) packet Take 17 g by mouth daily as needed. For constipation     Yes Historical Provider, MD  rOPINIRole (REQUIP) 0.25 MG tablet Take 0.25 mg by mouth 2 (two) times daily.     Yes Historical Provider, MD  senna (SENOKOT) 8.6 MG TABS Take 1 tablet by mouth at bedtime.     Yes Historical Provider, MD  Vitamin D, Ergocalciferol, (DRISDOL) 50000 UNITS CAPS Take 50,000 Units by mouth every 7 (seven) days. mondays   Yes Historical Provider, MD  diclofenac sodium (VOLTAREN) 1 % GEL Apply 1 application topically 4 (four) times daily as needed. For left knee pain       Historical Provider, MD    Family History: History reviewed. No pertinent family history. denies pertinent family history but says many of them have had surgeries of weight he is joints.  Social History:  reports that she has never smoked. She has never used smokeless tobacco. She reports that she does not  drink alcohol or use illicit drugs.  Review of Systems:  All systems reviewed and apart from history of presenting illness is pertinent for urinary frequency but no dysuria or fever or chills. Occasional nausea and vomiting but none recently. Denies abdominal pain. She says she has intermittent chronic anterior chest pain which has been going on for a year.  Physical Exam: Filed Vitals:   02/15/11 1441 02/15/11 1514 02/15/11 1603 02/15/11 1720  BP: 148/81 126/69 109/63   Pulse: 94 87 86   Temp: 97.9 F (36.6 C)   97.8 F (36.6 C)  TempSrc: Oral     Resp: 18 25 21    SpO2: 98% 96% 96%    General exam: Patient is a small built and moderately nourished female patient who is lying comfortably on the gurney and is in no obvious distress. Head, eyes and ENT: Nontraumatic and normocephalic. Pupils equally reacting to light  and accommodation. Oral mucosa is moist and edentulous. Neck: Supple. No JVD or carotid bruit. Lymphatics: No lymphadenopathy. Respiratory system: Clear. No increased work of breathing. Cardiovascular system: First and second heart sounds heard, regular. No JVD or murmurs or pedal edema. Gastrointestinal system: Patient has a colostomy bag and peri-stomal hernia. Stool is seen in the colostomy bag. She has scars of laparotomy. Abdomen is mildly distended but soft and nontender. Normal bowel sounds heard. No organomegaly or masses appreciated. Central nervous system: Alert and oriented to self and partly to place but not to time. No facial asymmetry. Extraocular muscle movements are intact. Tongue movements are normal. Extremities: Symmetrical grade 5 x 5 power. Lower extremity power testing is limited secondary to significant arthritis especially of the left knee. Sensory testing is unremarkable.   Labs on Admission:   [facility] [address] 02/15/11 1510  NA 134*  K 3.8  CL 97  CO2 25  GLUCOSE 303*  BUN 13  CREATININE 0.64  CALCIUM 9.1  MG --  PHOS --    Basename 02/15/11  1510  AST 16  ALT 14  ALKPHOS 121*  BILITOT 0.2*  PROT 7.5  ALBUMIN 3.2*   No results found for this basename: LIPASE:2,AMYLASE:2 in the last 72 hours  Basename 02/15/11 1510  WBC 7.6  NEUTROABS 4.6  HGB 13.6  HCT 40.5  MCV 79.7  PLT 209   No results found for this basename: CKTOTAL:3,CKMB:3,CKMBINDEX:3,TROPONINI:3 in the last 72 hours No results found for this basename: TSH,T4TOTAL,FREET3,T3FREE,THYROIDAB in the last 72 hours No results found for this basename: VITAMINB12:2,FOLATE:2,FERRITIN:2,TIBC:2,IRON:2,RETICCTPCT:2 in the last 72 hours  Radiological Exams on Admission: Dg Chest 2 View  02/15/2011  *RADIOLOGY REPORT*  Clinical Data:  left-sided weakness, stroke like symptoms  CHEST - 2 VIEW  Comparison: 12/05/2010  Findings: Cardiomediastinal silhouette is stable.  Stable chronic interstitial prominence.  No acute infiltrate or pleural effusion. No pulmonary edema.  Stable osteopenia and degenerative changes thoracic spine.  IMPRESSION: No active disease.  No significant change.  Original Report Authenticated By: Natasha Mead, M.D.   Ct Head Wo Contrast  02/15/2011  *RADIOLOGY REPORT*  Clinical Data: Headache with left facial, arm, and leg numbness.  CT HEAD WITHOUT CONTRAST  Technique:  Contiguous axial images were obtained from the base of the skull through the vertex without contrast.  Comparison: 10/15/2009  Findings: There is no evidence for acute hemorrhage, hydrocephalus, mass lesion, or abnormal extra-axial fluid collection.  No definite CT evidence for acute infarction.  Diffuse loss of parenchymal volume is consistent with atrophy. Patchy low attenuation in the deep hemispheric and periventricular white matter is nonspecific, but likely reflects chronic microvascular ischemic demyelination.  Chronic opacification of the maxillary sinus is again noted.  IMPRESSION: Stable.  No acute intracranial abnormality.  Atrophy with chronic small vessel white matter ischemic disease.   Chronic opacification of the maxillary sinuses.  Original Report Authenticated By: ERIC A. MANSELL, M.D.      Assessment/Plan 1. Left-sided numbness and weakness, possible TIA. Rule out CVA: Admit to telemetry. Follow MRI of the head ordered by the ED physician. Patient has passed a bedside swallow screen and received aspirin. Patient has stuttering presentation. Neurology has been consulted.? IV heparin. Followup on 2-D echocardiogram and carotid Dopplers. 2. Urinary tract infection: Continue IV Rocephin which she has tolerated him a pending urine cultures. 3. Uncontrolled type 2 diabetes mellitus: Check hemoglobin A1c and place on sliding scale insulin. 4. Hypertension: Controlled. 5. Coronary artery disease: Patient gives history of chronic chest  pains. We'll cycle cardiac enzymes. EKG shows normal sinus rhythm at 88 beats per minute with normal axis no acute ischemic change is. 6. Advanced directives: Patient is a DO NOT RESUSCITATE which was confirmed by documentation from the skilled nursing facility and with the ED physician and the patient.  Shelia Webb 02/15/2011, 6:34 PM

## 2011-02-15 NOTE — ED Notes (Signed)
Pt reports she continues to have left sided facial and arm numbness but seems improved from initially. Pt previously reported left leg numbness which is now resolved.

## 2011-02-15 NOTE — ED Provider Notes (Signed)
History     CSN: [id number]  Arrival date & time 02/15/11  1439   First MD Initiated Contact with Patient 02/15/11 1441      Chief Complaint  Patient presents with  . Numbness  . Headache    (Consider location/radiation/quality/duration/timing/severity/associated sxs/prior treatment) HPI Patient is an 78 yo F with history of multiple medical problems who presents by EMS from nursing facility for evaluation of left upper and lower extremity as well as facial numbness and reported LUE weakness.  Patient was normal at bedtime of 1 AM last night but awoke with symptoms this morning.  She reports that these have resolved at this time.  She reports feeling fine with no chest pain, difficulty breathing, urinary symptoms, abdominal symptoms, recent URI symptoms, or fevers.  Patient is alert and oriented and presents with DNR paperwork.  She is hemodynamically stable and without other complaints.  Past Medical History  Diagnosis Date  . Hypertension   . Diabetes mellitus   . Dysrhythmia     a-fib  . Anemia   . Acute respiratory failure   . Chronic kidney disease     Kidney failure  . Allergic rhinitis   . Osteoarthrosis   . Osteoporosis   . Anxiety   . Depression   . Restless leg syndrome   . GERD (gastroesophageal reflux disease)   . Hypertonic bladder   . Peripheral vascular disease   . Coronary atherosclerosis due to lipid rich plaque   . DNR (do not resuscitate)     Past Surgical History  Procedure Date  . Colon surgery     has colostomy  . Abdominal hysterectomy   . Appendectomy   . Tonsillectomy   . Cardiac catheterization     bilateral cataract surgery  . Diaphragmatic hernia repair     with gangrene  . Orif patella 12/07/2010    Procedure: OPEN REDUCTION INTERNAL (ORIF) FIXATION PATELLA;  Surgeon: Nadara Mustard, MD;  Location: [facility] OR;  Service: Orthopedics;  Laterality: Left;  Left Knee Hamstring Release and Open Reduction Internal Fixation Patella  . Orif patella  12/21/2010    Procedure: OPEN REDUCTION INTERNAL (ORIF) FIXATION PATELLA;  Surgeon: Nadara Mustard, MD;  Location: [facility] OR;  Service: Orthopedics;  Laterality: Left;  Left Knee Hamstring Release and Open Reduction Internal Fixation Patella    History reviewed. No pertinent family history.  History  Substance Use Topics  . Smoking status: Never Smoker   . Smokeless tobacco: Never Used  . Alcohol Use: No    OB History    Grav Para Term Preterm Abortions TAB SAB Ect Mult Living                  Review of Systems  Eyes: Negative.   Respiratory: Negative.   Cardiovascular: Negative.   Gastrointestinal: Negative.   Genitourinary: Negative.   Musculoskeletal: Negative.   Skin: Negative.   Neurological: Positive for weakness, numbness and headaches.  Hematological: Negative.   Psychiatric/Behavioral: Negative.   All other systems reviewed and are negative.    Allergies  Penicillins  Home Medications   Current Outpatient Rx  Name Route Sig Dispense Refill  . LACRI-LUBE OP Both Eyes Place 1 drop into both eyes at bedtime.      Marland Kitchen CALCIUM CARBONATE-VITAMIN D 500-200 MG-UNIT PO TABS Oral Take 1 tablet by mouth daily.      . CYCLOSPORINE 0.05 % OP EMUL Both Eyes Place 1 drop into both eyes 2 (two) times daily.      Marland Kitchen  DULOXETINE HCL 60 MG PO CPEP Oral Take 60 mg by mouth daily.      Marland Kitchen FLUTICASONE-SALMETEROL 100-50 MCG/DOSE IN AEPB Inhalation Inhale 1 puff into the lungs daily.      . FUROSEMIDE 20 MG PO TABS Oral Take 20 mg by mouth daily.      Marland Kitchen GABAPENTIN 400 MG PO CAPS Oral Take 400 mg by mouth at bedtime.      . GUAIFENESIN ER 600 MG PO TB12 Oral Take 600 mg by mouth 2 (two) times daily.    Marland Kitchen LACRISERT 5 MG OP INST  5 mg daily.      . IPRATROPIUM BROMIDE 0.02 % IN SOLN Nebulization Take 500 mcg by nebulization every 6 (six) hours as needed. For congestion    . METOPROLOL SUCCINATE ER 25 MG PO TB24 Oral Take 25 mg by mouth daily.      Carma Leaven M PLUS PO TABS Oral Take 1 tablet by  mouth daily.      Marland Kitchen OMEPRAZOLE 20 MG PO CPDR Oral Take 20 mg by mouth daily.      . OXYCODONE-ACETAMINOPHEN 5-325 MG PO TABS Oral Take 1 tablet by mouth every 6 (six) hours as needed. For pain    . POLYETHYL GLYCOL-PROPYL GLYCOL 0.4-0.3 % OP GEL Both Eyes Place 1 drop into both eyes 4 (four) times daily.      Marland Kitchen POLYETHYLENE GLYCOL 3350 PO PACK Oral Take 17 g by mouth daily as needed. For constipation      . ROPINIROLE HCL 0.25 MG PO TABS Oral Take 0.25 mg by mouth 2 (two) times daily.      . SENNA 8.6 MG PO TABS Oral Take 1 tablet by mouth at bedtime.      Marland Kitchen VITAMIN D (ERGOCALCIFEROL) 50000 UNITS PO CAPS Oral Take 50,000 Units by mouth every 7 (seven) days. mondays    . DICLOFENAC SODIUM 1 % TD GEL Topical Apply 1 application topically 4 (four) times daily as needed. For left knee pain         BP 109/63  Pulse 86  Temp(Src) 97.8 F (36.6 C) (Oral)  Resp 21  SpO2 96%  Physical Exam  Nursing note and vitals reviewed. Constitutional: She is oriented to person, place, and time. She appears well-developed and well-nourished. No distress.  HENT:  Head: Normocephalic and atraumatic.  Eyes: Conjunctivae and EOM are normal. Pupils are equal, round, and reactive to light.  Neck: Normal range of motion.  Cardiovascular: Normal rate, regular rhythm, normal heart sounds and intact distal pulses.  Exam reveals no gallop and no friction rub.   No murmur heard. Pulmonary/Chest: Effort normal and breath sounds normal. No respiratory distress. She has no wheezes. She has no rales.  Abdominal: Soft. Bowel sounds are normal. There is no tenderness. There is no rebound and no guarding.       Colostomy in place with soft brown stool in bag.  Patient with abnormal abdominal anatomy at baseline but reports her abdomen is in its normal state.  No TTP  Musculoskeletal: Normal range of motion. She exhibits no edema and no tenderness.  Neurological: She is alert and oriented to person, place, and time. No  cranial nerve deficit. She exhibits normal muscle tone. Coordination normal.  Skin: Skin is warm and dry. No rash noted.  Psychiatric: She has a normal mood and affect.    ED Course  Procedures (including critical care time)   Date: 02/15/2011  Rate: 88  Rhythm: normal sinus  rhythm  QRS Axis: normal  Intervals: normal  ST/T Wave abnormalities: nonspecific T wave changes  Conduction Disutrbances:none  Narrative Interpretation:   Old EKG Reviewed: unchanged  Labs Reviewed  COMPREHENSIVE METABOLIC PANEL - Abnormal; Notable for the following:    Sodium 134 (*)    Glucose, Bld 303 (*)    Albumin 3.2 (*)    Alkaline Phosphatase 121 (*)    Total Bilirubin 0.2 (*)    GFR calc non Af Amer 78 (*)    All other components within normal limits  URINALYSIS, ROUTINE W REFLEX MICROSCOPIC - Abnormal; Notable for the following:    APPearance TURBID (*)    Glucose, UA 500 (*)    Hgb urine dipstick MODERATE (*)    Leukocytes, UA LARGE (*)    All other components within normal limits  URINE MICROSCOPIC-ADD ON - Abnormal; Notable for the following:    Bacteria, UA MANY (*)    All other components within normal limits  PROTIME-INR  APTT  CBC  DIFFERENTIAL  I-STAT TROPONIN I  I-STAT TROPONIN I  I-STAT TROPONIN I  URINE CULTURE   Dg Chest 2 View  02/15/2011  *RADIOLOGY REPORT*  Clinical Data:  left-sided weakness, stroke like symptoms  CHEST - 2 VIEW  Comparison: 12/05/2010  Findings: Cardiomediastinal silhouette is stable.  Stable chronic interstitial prominence.  No acute infiltrate or pleural effusion. No pulmonary edema.  Stable osteopenia and degenerative changes thoracic spine.  IMPRESSION: No active disease.  No significant change.  Original Report Authenticated By: Natasha Mead, M.D.   Ct Head Wo Contrast  02/15/2011  *RADIOLOGY REPORT*  Clinical Data: Headache with left facial, arm, and leg numbness.  CT HEAD WITHOUT CONTRAST  Technique:  Contiguous axial images were obtained from the  base of the skull through the vertex without contrast.  Comparison: 10/15/2009  Findings: There is no evidence for acute hemorrhage, hydrocephalus, mass lesion, or abnormal extra-axial fluid collection.  No definite CT evidence for acute infarction.  Diffuse loss of parenchymal volume is consistent with atrophy. Patchy low attenuation in the deep hemispheric and periventricular white matter is nonspecific, but likely reflects chronic microvascular ischemic demyelination.  Chronic opacification of the maxillary sinus is again noted.  IMPRESSION: Stable.  No acute intracranial abnormality.  Atrophy with chronic small vessel white matter ischemic disease.  Chronic opacification of the maxillary sinuses.  Original Report Authenticated By: ERIC A. MANSELL, M.D.     1. Numbness   2. UTI (urinary tract infection)       MDM  Patient was evaluated by myself.  She was asymptomatic initially but reported concerning neurologic symptoms earlier.  She had initial work-up for possible TIA vs CVA.  Head CT was normal and laboratory work-up was notable for UTI.  Patient then had return of some of her sensory symptoms in the LUE.  I spoke with her son, Anasofia Micallef.  We discussed whether he would want additional testing done in light of his mother's DNR status as she could return to her nursing facility with antibiotics for UTI if this was the case.  He preferred further evaluation and admission.  Urine culture was sent and patient was given Rocephin IV.  MRI was ordered and swallow screen was performed.  ASA was given and patient was admitted to hospitalist for further management.  Stroke team was not consulted given the timing of patient's symptoms as well as their subjective nature.  Patient was admitted in stable condition.  Cyndra Numbers, MD 02/16/11 1041

## 2011-02-15 NOTE — ED Notes (Signed)
Pt states awakening and not being able to feel and move left side. States going to bed with headache. States sx have gotten better but still remain.

## 2011-02-15 NOTE — ED Notes (Signed)
Pt just returned from scan and is  resting comfortably.

## 2011-02-15 NOTE — ED Notes (Signed)
Pt.'s urine was yellow and very cloudy in appearance.

## 2011-02-16 LAB — URINE CULTURE: Culture  Setup Time: 201301251739

## 2011-02-16 LAB — CARDIAC PANEL(CRET KIN+CKTOT+MB+TROPI)
CK, MB: 2.1 ng/mL (ref 0.3–4.0)
CK, MB: 1.8 ng/mL (ref 0.3–4.0)
Relative Index: INVALID (ref 0.0–2.5)
Total CK: 37 U/L (ref 7–177)
Total CK: 30 U/L (ref 7–177)
Troponin I: <0.3 ng/mL (ref <0.30)

## 2011-02-16 LAB — GLUCOSE, CAPILLARY
Glucose-Capillary: 187 mg/dL — ABNORMAL HIGH (ref 70–99)
Glucose-Capillary: 235 mg/dL — ABNORMAL HIGH (ref 70–99)
Glucose-Capillary: 397 mg/dL — ABNORMAL HIGH (ref 70–99)
Glucose-Capillary: 328 mg/dL — ABNORMAL HIGH (ref 70–99)

## 2011-02-16 LAB — LIPID PANEL
Cholesterol: 194 mg/dL (ref 0–200)
HDL: 42 mg/dL (ref >39)
LDL Cholesterol: 95 mg/dL (ref 0–99)
Total CHOL/HDL Ratio: 4.6 RATIO
Triglycerides: 287 mg/dL — ABNORMAL HIGH (ref <150)
VLDL: 57 mg/dL — ABNORMAL HIGH (ref 0–40)

## 2011-02-16 LAB — HEMOGLOBIN A1C
Hgb A1c MFr Bld: 11.5 % — ABNORMAL HIGH
Mean Plasma Glucose: 283 mg/dL — ABNORMAL HIGH

## 2011-02-16 LAB — MRSA PCR SCREENING: MRSA by PCR: NEGATIVE

## 2011-02-16 MED ORDER — INSULIN GLARGINE 100 UNIT/ML ~~LOC~~ SOLN
5.0000 [IU] | Freq: Every day | SUBCUTANEOUS | Status: DC
  Administered 2011-02-16: 5 [IU] via SUBCUTANEOUS
  Filled 2011-02-16: qty 3

## 2011-02-16 MED ORDER — INSULIN ASPART 100 UNIT/ML ~~LOC~~ SOLN
4.0000 [IU] | Freq: Once | SUBCUTANEOUS | Status: AC
  Administered 2011-02-16: 4 [IU] via SUBCUTANEOUS
  Filled 2011-02-16: qty 3

## 2011-02-16 NOTE — Evaluation (Signed)
Physical Therapy Evaluation Patient Details Name: Shelia Webb MRN: [id number] DOB: [birth year] Today's Date: 02/16/2011  Problem List:  Patient Active Problem List  Diagnoses  . COLONIC POLYPS  . DM  . RESTLESS LEG SYNDROME  . DRY EYE SYNDROME  . HYPERTENSION  . CAD  . PERIPHERAL VASCULAR DISEASE  . VENOUS STASIS ULCER  . HEMORRHOIDS, INTERNAL  . ESOPHAGEAL STRICTURE  . GERD  . DIVERTICULOSIS, COLON  . OVERACTIVE BLADDER  . OSTEOARTHRITIS  . UNSTEADY GAIT  . Ventral hernia without mention of obstruction or gangrene  . TIA (transient ischemic attack)    Past Medical History:  Past Medical History  Diagnosis Date  . Hypertension   . Diabetes mellitus   . Dysrhythmia     a-fib  . Anemia   . Acute respiratory failure   . Chronic kidney disease     Kidney failure  . Allergic rhinitis   . Osteoarthrosis   . Osteoporosis   . Anxiety   . Depression   . Restless leg syndrome   . GERD (gastroesophageal reflux disease)   . Hypertonic bladder   . Peripheral vascular disease   . Coronary atherosclerosis due to lipid rich plaque   . DNR (do not resuscitate)    Past Surgical History:  Past Surgical History  Procedure Date  . Colon surgery     has colostomy  . Abdominal hysterectomy   . Appendectomy   . Tonsillectomy   . Cardiac catheterization     bilateral cataract surgery  . Diaphragmatic hernia repair     with gangrene  . Orif patella 12/07/2010    Procedure: OPEN REDUCTION INTERNAL (ORIF) FIXATION PATELLA;  Surgeon: Nadara Mustard, MD;  Location: [facility] OR;  Service: Orthopedics;  Laterality: Left;  Left Knee Hamstring Release and Open Reduction Internal Fixation Patella  . Orif patella 12/21/2010    Procedure: OPEN REDUCTION INTERNAL (ORIF) FIXATION PATELLA;  Surgeon: Nadara Mustard, MD;  Location: [facility] OR;  Service: Orthopedics;  Laterality: Left;  Left Knee Hamstring Release and Open Reduction Internal Fixation Patella    PT Assessment/Plan/Recommendation PT  Assessment Clinical Impression Statement: Patient presented with symptoms of TIA.  PAtient appears to have functional strength in bil UEs as she did PTA.  Patient was bedbound PTA and wass lifted OOB to chair for the last 3 years.  Upon assessment, pt. has bil LE contractures.  Do not feel that pt. will benefit from PT as this is a chronic problem for pt and she is currently at baseline funtional level.  MD:  Patient states that her right LE is more painful than usual .  Noted very rigid muscles throughout.  Would spasm medicine be beneficial? PT Recommendation/Assessment: Patent does not need any further PT services No Skilled PT: Patient at baseline level of functioning;Patient will have necessary level of assist by caregiver at discharge PT Recommendation Follow Up Recommendations: No PT follow up;Supervision/Assistance - 24 hour;Skilled nursing facility Equipment Recommended: Defer to next venue PT Goals   N/A  PT Evaluation Precautions/Restrictions  Precautions Precautions: Fall Precaution Comments: NH used lift secondary to bil LE contractures Required Braces or Orthoses: No Prior Functioning  Home Living Receives Help From: Personal care attendant Type of Home: Skilled Nursing Facility Home Layout: One level Home Adaptive Equipment: Hospital bed (that belongs to NH) Additional Comments: SNF used a lift to get pt up to the wheelchair Prior Function Level of Independence: Needs assistance with ADLs;Needs assistance with homemaking;Needs assistance with tranfers Bath: Total  Toileting: Total Dressing: Total Grooming: Total Feeding: Supervision/set-up Meal Prep: Total Driving: No Cognition Cognition Arousal/Alertness: Awake/alert Overall Cognitive Status: Appears within functional limits for tasks assessed Orientation Level: Oriented X4 Sensation/Coordination Sensation Light Touch: Impaired Detail Light Touch Impaired Details: Impaired LLE Stereognosis: Not  tested Hot/Cold: Not tested Proprioception: Not tested Coordination Gross Motor Movements are Fluid and Coordinated: No Fine Motor Movements are Fluid and Coordinated: No Extremity Assessment RUE Assessment RUE Assessment: Exceptions to [facility] RUE Strength RUE Overall Strength: Within Functional Limits for tasks performed LUE Assessment LUE Assessment: Exceptions to [facility] LUE Strength LUE Overall Strength: Within Functional Limits for tasks assessed RLE Assessment RLE Assessment: Exceptions to [facility] RLE PROM (degrees) Overall PROM Right Lower Extremity: Deficits;Due to premorbid status RLE Overall PROM Comments: unable to flex patient's knee at all secondary to contracture vs. spasm.  Unable to dorsiflex at all either.  Plantar flexion with trace movement. RLE Tone RLE Tone Comments: Extension contracture which PT could not relax even with gentle ROM LLE Assessment LLE Assessment: Exceptions to [facility] LLE Strength LLE Overall Strength: Deficits;Due to premorbid status LLE Overall Strength Comments: Patient reports that she broke her left knee years ago.  Visibly deformed.  Rigid extended LE.   Mobility (including Balance) Bed Mobility Bed Mobility: Yes Rolling Right: 1: +1 Total assist;Patient percentage (comment);With rail (pt = 10%) Rolling Right Details (indicate cue type and reason): reached for rail Rolling Left: 1: +1 Total assist;Patient percentage (comment);With rail (pt = 10%) Rolling Left Details (indicate cue type and reason): reached for rail Transfers Transfers: No Transfer via Lift Equipment:  (used lift PTA; has not tried to transfer in years per pt.) Ambulation/Gait Ambulation/Gait: No Stairs: No  Posture/Postural Control Posture/Postural Control: Postural limitations Postural Limitations: Rigid posture throughout body.   Balance Balance Assessed: No Exercise    End of Session PT - End of Session Activity Tolerance: Patient limited by pain (Right knee) Patient  left: in bed;with call bell in reach Nurse Communication: Mobility status for transfers;Need for lift equipment General Behavior During Session: [facility] for tasks performed Cognition: [facility] for tasks performed  INGOLD,Aoife Bold 02/16/2011, 12:00 PM  [facility] [phone] (pager)

## 2011-02-16 NOTE — Progress Notes (Signed)
OT NOTE: OT consult received and appreciated. Discussed with pt. And pt reports feeling back to baseline and bed bound PTA. Pt. Reports self feeding independently and completing grooming tasks without assistance. Due to pt. At baseline will sign off acutely and recommend return to SNF at D/C.  Cassandria Anger, OTR/L Pager: [phone] 02/16/2011 .

## 2011-02-16 NOTE — Progress Notes (Signed)
Carotid duplex completed. Technically difficult due to extremely high bifurcation,tortuosity, and inability for patient to turn neck for optimal imaging. No obvious evidence of significant ICA stenosis. Vertebral arteries were not insonated.     Matisyn Cabeza D.,RVS 02/16/2011 4:39 PM

## 2011-02-16 NOTE — Progress Notes (Signed)
Subjective: Patient says she's feeling much better. Mild numbness in the left hand but the rest of the numbness and weakness and left side of the body has resolved. Enjoyed her breakfast this morning. No chest pain.  Objective: Blood pressure 143/80, pulse 85, temperature 97.9 F (36.6 C), temperature source Oral, resp. rate 18, height 5' (1.524 m), weight 71.2 kg (156 lb 15.5 oz), SpO2 94.00%.  Intake/Output Summary (Last 24 hours) at 02/16/11 1102 Last data filed at 02/16/11 0800  Gross per 24 hour  Intake    360 ml  Output      0 ml  Net    360 ml    General Exam: Comfortable. Respiratory System: Clear. No increased work of breathing. Cardiovascular System: First and second heart sounds heard. Regular rate and rhythm. No JVD/murmurs. Telemetry shows sinus rhythm. Gastrointestinal System: Abdomen is non distended, soft and normal bowel sounds heard. Central Nervous System: Alert and oriented. No focal neurological deficits.  Lab Results: Basic Metabolic Panel:  Basename 02/15/11 1510  NA 134*  K 3.8  CL 97  CO2 25  GLUCOSE 303*  BUN 13  CREATININE 0.64  CALCIUM 9.1  MG --  PHOS --   Liver Function Tests:  Basename 02/15/11 1510  AST 16  ALT 14  ALKPHOS 121*  BILITOT 0.2*  PROT 7.5  ALBUMIN 3.2*   No results found for this basename: LIPASE:2,AMYLASE:2 in the last 72 hours No results found for this basename: AMMONIA:2 in the last 72 hours CBC:  Basename 02/15/11 1510  WBC 7.6  NEUTROABS 4.6  HGB 13.6  HCT 40.5  MCV 79.7  PLT 209   Cardiac Enzymes:  Basename 02/16/11 0400 02/15/11 2027  CKTOTAL 37 42  CKMB 2.1 2.0  CKMBINDEX -- --  TROPONINI <0.30 <0.30   BNP: No results found for this basename: PROBNP:3 in the last 72 hours D-Dimer: No results found for this basename: DDIMER:2 in the last 72 hours CBG:  Basename 02/16/11 0706 02/16/11 0016  GLUCAP 235* 187*   Hemoglobin A1C:  Basename 02/15/11 2027  HGBA1C 11.5*   Fasting Lipid  Panel:  Basename 02/16/11 0500  CHOL 194  HDL 42  LDLCALC 95  TRIG 287*  CHOLHDL 4.6  LDLDIRECT --   Thyroid Function Tests: No results found for this basename: TSH,T4TOTAL,FREET4,T3FREE,THYROIDAB in the last 72 hours Anemia Panel: No results found for this basename: VITAMINB12,FOLATE,FERRITIN,TIBC,IRON,RETICCTPCT in the last 72 hours Coagulation:  Basename 02/15/11 1510  LABPROT 13.3  INR 0.99   Urine Drug Screen: Drugs of Abuse  No results found for this basename: labopia,  cocainscrnur,  labbenz,  amphetmu,  thcu,  labbarb    Alcohol Level: No results found for this basename: ETH:2 in the last 72 hours  Micro Results: Recent Results (from the past 240 hour(s))  MRSA PCR SCREENING     Status: Normal   Collection Time   02/16/11  4:39 AM      Component Value Range Status Comment   MRSA by PCR NEGATIVE  NEGATIVE  Final     Studies/Results: Dg Chest 2 View  02/15/2011  *RADIOLOGY REPORT*  Clinical Data:  left-sided weakness, stroke like symptoms  CHEST - 2 VIEW  Comparison: 12/05/2010  Findings: Cardiomediastinal silhouette is stable.  Stable chronic interstitial prominence.  No acute infiltrate or pleural effusion. No pulmonary edema.  Stable osteopenia and degenerative changes thoracic spine.  IMPRESSION: No active disease.  No significant change.  Original Report Authenticated By: Natasha Mead, M.D.   Ct  Head Wo Contrast  02/15/2011  *RADIOLOGY REPORT*  Clinical Data: Headache with left facial, arm, and leg numbness.  CT HEAD WITHOUT CONTRAST  Technique:  Contiguous axial images were obtained from the base of the skull through the vertex without contrast.  Comparison: 10/15/2009  Findings: There is no evidence for acute hemorrhage, hydrocephalus, mass lesion, or abnormal extra-axial fluid collection.  No definite CT evidence for acute infarction.  Diffuse loss of parenchymal volume is consistent with atrophy. Patchy low attenuation in the deep hemispheric and periventricular  white matter is nonspecific, but likely reflects chronic microvascular ischemic demyelination.  Chronic opacification of the maxillary sinus is again noted.  IMPRESSION: Stable.  No acute intracranial abnormality.  Atrophy with chronic small vessel white matter ischemic disease.  Chronic opacification of the maxillary sinuses.  Original Report Authenticated By: ERIC A. MANSELL, M.D.   Mr Laqueta Jean Wo Contrast  02/15/2011  *RADIOLOGY REPORT*  Clinical Data: 78 year old female with left-sided numbness. Weakness.  MRI HEAD WITHOUT AND WITH CONTRAST  Technique:  Multiplanar, multiecho pulse sequences of the brain and surrounding structures were obtained according to standard protocol without and with intravenous contrast  Contrast: 15mL MULTIHANCE GADOBENATE DIMEGLUMINE 529 MG/ML IV SOLN  Comparison: head CTs 02/15/2011 and earlier.  Findings: No restricted diffusion to suggest acute infarction.  No midline shift, mass effect, evidence of mass lesion, ventriculomegaly, extra-axial collection or acute intracranial hemorrhage.  Cervicomedullary junction and pituitary are within normal limits.  Major intracranial vascular flow voids are preserved.  Generalized cerebral volume loss. Wallace Cullens and white matter signal is within normal limits for age throughout the brain.  No abnormal enhancement identified.  Hyperostosis of the skull.  The ankylosis of the visualized cervical spine via a large anterior osteophytes.  However, there is no spinal stenosis in the visualized cervical spine. Ligamentous hypertrophy about the dens.  Visualized bone marrow signal is within normal limits.  Bilateral maxillary sinus mucosal thickening and inspissated secretions.  Postoperative changes to the globes.  Negative visualized scalp soft tissues.  IMPRESSION: 1. No acute intracranial abnormality. 2.  Ankylosis of the visualized cervical spine. 3.  Chronic maxillary sinus disease.  Original Report Authenticated By: Harley Hallmark, M.D.     Medications: Scheduled Meds:    . aspirin  324 mg Oral Once  . aspirin  325 mg Oral Daily  . calcium-vitamin D  1 tablet Oral Daily  . cefTRIAXone (ROCEPHIN)  IV  1 g Intravenous Once  . cefTRIAXone (ROCEPHIN)  IV  1 g Intravenous Q24H  . cycloSPORINE  1 drop Both Eyes BID  . DULoxetine  60 mg Oral Daily  . enoxaparin  40 mg Subcutaneous QHS  . Fluticasone-Salmeterol  1 puff Inhalation Daily  . furosemide  20 mg Oral Daily  . gabapentin  400 mg Oral QHS  . guaiFENesin  600 mg Oral BID  . metoprolol succinate  25 mg Oral Daily  . multivitamins ther. w/minerals  1 tablet Oral Daily  . pantoprazole  40 mg Oral Q1200  . polyvinyl alcohol  1 drop Both Eyes QID  . rOPINIRole  0.25 mg Oral BID  . senna  1 tablet Oral QHS  . Vitamin D (Ergocalciferol)  50,000 Units Oral Q7 days  . DISCONTD: Polyethyl Glycol-Propyl Glycol  1 drop Both Eyes QID   Continuous Infusions:  PRN Meds:.acetaminophen, diclofenac sodium, gadobenate dimeglumine, oxyCODONE-acetaminophen, polyethylene glycol  Assessment/Plan: 1. TIA: Most of left sided symptoms have resolved. Follow carotid Doppler and 2-D echocardiogram. Continue full dose  aspirin. Neurology input appreciated. 2. Uncontrolled type 2 diabetes mellitus: Markedly elevated hemoglobin A1c. Of note patient was not on any hypoglycemic medications at the skilled nursing facility. Add Lantus and continue sliding scale insulin. 3. Urinary tract infection: Continue intravenous Rocephin. 4. Hypertension: Reasonable control. 5. Pseudohyponatremia secondary to hyperglycemia. 6. DO NOT RESUSCITATE 7. History of atrial fibrillation, currently in sinus rhythm and has not been on Coumadin: Continue aspirin 8. Status post Hartmann's pouch  Disposition: Discharge back to skilled nursing facility pending echo, carotid Doppler and urine culture sensitivity results. Probably in the next 24-48 hours.   Shelia Webb 02/16/2011, 11:02 AM

## 2011-02-16 NOTE — Progress Notes (Signed)
Stroke Team Progress Note  HISTORY Shelia Webb is an 78 y.o. female a history of atrial fibrillation, hypertension, diabetes mellitus and chronic kidney disease, presenting with acute onset of weakness and numbness involving left side. Patient was well early this morning. She apparently took a nap after breakfast and woke up with the above deficits. No previous history of stroke or TIA. She has not been on anticoagulation. Symptoms have resolved except for minimal subjective numbness involving her left upper extremity. NIH stroke score at this point is 0. She has not been on antiplatelet therapy including no aspirin. CT scan of her head showed no acute intracranial abnormality.  SUBJECTIVE Feels well this am.  No weakness.  Has chronic visual disturbance due to macular degeneration.   OBJECTIVE Most recent Vital Signs: Temp: 97.9 F (36.6 C) (01/26 0612) Temp src: Oral (01/26 0612) BP: 122/78 mmHg (01/26 0612) Pulse Rate: 79  (01/26 0612) Respiratory Rate: 18 O2 Saturation: 95%  LSN: 0900  tPA Given: No  MRankin: 1  CBG (last 3)  Basename 02/16/11 0706 02/16/11 0016  GLUCAP 235* 187*       . aspirin  325 mg Oral Daily  . calcium-vitamin D  1 tablet Oral Daily  . cefTRIAXone (ROCEPHIN)  IV  1 g Intravenous Once  . cefTRIAXone (ROCEPHIN)  IV  1 g Intravenous Q24H  . cycloSPORINE  1 drop Both Eyes BID  . DULoxetine  60 mg Oral Daily  . enoxaparin  40 mg Subcutaneous QHS  . Fluticasone-Salmeterol  1 puff Inhalation Daily  . furosemide  20 mg Oral Daily  . gabapentin  400 mg Oral QHS  . guaiFENesin  600 mg Oral BID  . metoprolol succinate  25 mg Oral Daily  . multivitamins ther. w/minerals  1 tablet Oral Daily  . pantoprazole  40 mg Oral Q1200  . polyvinyl alcohol  1 drop Both Eyes QID  . rOPINIRole  0.25 mg Oral BID  . senna  1 tablet Oral QHS  . Vitamin D (Ergocalciferol)  50,000 Units Oral Q7 days  PRN acetaminophen, diclofenac sodium, gadobenate dimeglumine,  oxyCODONE-acetaminophen, polyethylene glycol  Diet:  Carb Control thin liquids Activity:  Up with assistance DVT Prophylaxis: lovenox  Mental Status: Alert, oriented, thought content appropriate.  Speech fluent without evidence of aphasia. Able to follow 3 step commands without difficulty. Cranial Nerves: II- Visual fields grossly intact. III/IV/VI-Extraocular movements intact.  Pupils reactive bilaterally. V/VII-Smile symmetric VIII-grossly intact IX/X-normal gag XI-bilateral shoulder shrug XII-midline tongue extension Motor: 5/5 bilaterally with normal tone and bulk Sensory: Pinprick and light touch intact throughout, bilaterally Deep Tendon Reflexes: 2+ UEs, absent patellars and ankles Plantars: Downgoing bilaterally Cerebellar: Normal finger-to-nose, normal rapid alternating movements.   Significant Diagnostic Studies: CBC    Component Value Date/Time   WBC 7.6 02/15/2011 1510   RBC 5.08 02/15/2011 1510   HGB 13.6 02/15/2011 1510   HCT 40.5 02/15/2011 1510   PLT 209 02/15/2011 1510   MCV 79.7 02/15/2011 1510   MCH 26.8 02/15/2011 1510   MCHC 33.6 02/15/2011 1510   RDW 14.9 02/15/2011 1510   LYMPHSABS 2.2 02/15/2011 1510   MONOABS 0.6 02/15/2011 1510   EOSABS 0.2 02/15/2011 1510   BASOSABS 0.0 02/15/2011 1510   CMP    Component Value Date/Time   NA 134* 02/15/2011 1510   K 3.8 02/15/2011 1510   CL 97 02/15/2011 1510   CO2 25 02/15/2011 1510   GLUCOSE 303* 02/15/2011 1510   BUN 13 02/15/2011 1510  CREATININE 0.64 02/15/2011 1510   CALCIUM 9.1 02/15/2011 1510   PROT 7.5 02/15/2011 1510   ALBUMIN 3.2* 02/15/2011 1510   AST 16 02/15/2011 1510   ALT 14 02/15/2011 1510   ALKPHOS 121* 02/15/2011 1510   BILITOT 0.2* 02/15/2011 1510   GFRNONAA 78* 02/15/2011 1510   GFRAA >90 02/15/2011 1510   COAGS Lab Results  Component Value Date   INR 0.99 02/15/2011   Lipid Panel    Component Value Date/Time   CHOL 194 02/16/2011 0500   TRIG 287* 02/16/2011 0500   HDL 42 02/16/2011 0500   CHOLHDL  4.6 02/16/2011 0500   VLDL 57* 02/16/2011 0500   LDLCALC 95 02/16/2011 0500   HgbA1C  Lab Results  Component Value Date   HGBA1C 11.5* 02/15/2011     02/15/2011 CT HEAD WITHOUT CONTRAST   There is no evidence for acute hemorrhage, hydrocephalus, mass lesion, or abnormal extra-axial fluid collection.  No definite CT evidence for acute infarction.  Diffuse loss of parenchymal volume is consistent with atrophy. Patchy low attenuation in the deep hemispheric and periventricular white matter is nonspecific, but likely reflects chronic microvascular ischemic demyelination.  Chronic opacification of the maxillary sinus is again noted.  IMPRESSION: Stable.  No acute intracranial abnormality.  Atrophy with chronic small vessel white matter ischemic disease. ERIC A. MANSELL, M.D.   02/15/2011 .  MRI HEAD WITHOUT AND WITH CONTRAST    Findings: No restricted diffusion to suggest acute infarction.  No midline shift, mass effect, evidence of mass lesion, ventriculomegaly, extra-axial collection or acute intracranial hemorrhage.  Cervicomedullary junction and pituitary are within normal limits.  Major intracranial vascular flow voids are preserved.  Generalized cerebral volume loss. Wallace Cullens and white matter signal is within normal limits for age throughout the brain.  No abnormal enhancement identified.  Hyperostosis of the skull.  The ankylosis of the visualized cervical spine via a large anterior osteophytes.  However, there is no spinal stenosis in the visualized cervical spine. Ligamentous hypertrophy about the dens.  Visualized bone marrow signal is within normal limits.  Bilateral maxillary sinus mucosal thickening and inspissated secretions.  Postoperative changes to the globes.  Negative visualized scalp soft tissues.  IMPRESSION: 1. No acute intracranial abnormality. 2.  Ankylosis of the visualized cervical spine. 3.  Chronic maxillary sinus disease.  H.LEE HALL III, M.D.    ASSESSMENT Ms. Shelia Webb is a 78  y.o. female with   1. TIA , probable with L sided weakness resolved.  MRI head neg.  Carotid doppler and 2D echo pending.    2. Afib- rate controlled.  Not on coumadin  3. HTN- controlled Stroke risk factors: AF, HTn, DM, hyperlipidemia  The patient indicates that she does not know why she was not put on Coumadin. The patient reports that she has not been and the story in several years, has not had any falls. The patient indicates that she does not have a cardiologist.  Hospital day # 1  TREATMENT/PLAN 1. Continue ASA 325 mg daily for secondary stroke prevention. 2. Aggressive risk factor modification, goal A1C 6.5.   3. Activity or will be changed, patient is to get up out of bed, physical and occupational therapy can work with the patient.  Marya Fossa PA-C [facility] [phone] 02/16/11 1050

## 2011-02-17 LAB — GLUCOSE, CAPILLARY
Glucose-Capillary: 274 mg/dL — ABNORMAL HIGH (ref 70–99)
Glucose-Capillary: 343 mg/dL — ABNORMAL HIGH (ref 70–99)
Glucose-Capillary: 240 mg/dL — ABNORMAL HIGH (ref 70–99)

## 2011-02-17 MED ORDER — INSULIN ASPART 100 UNIT/ML ~~LOC~~ SOLN
0.0000 [IU] | Freq: Every day | SUBCUTANEOUS | Status: DC
  Administered 2011-02-17: 2 [IU] via SUBCUTANEOUS

## 2011-02-17 MED ORDER — INSULIN ASPART 100 UNIT/ML ~~LOC~~ SOLN
0.0000 [IU] | Freq: Three times a day (TID) | SUBCUTANEOUS | Status: DC
  Administered 2011-02-17 (×2): 7 [IU] via SUBCUTANEOUS
  Administered 2011-02-18: 9 [IU] via SUBCUTANEOUS
  Administered 2011-02-18: 7 [IU] via SUBCUTANEOUS
  Administered 2011-02-18: 9 [IU] via SUBCUTANEOUS

## 2011-02-17 MED ORDER — INSULIN ASPART 100 UNIT/ML ~~LOC~~ SOLN
3.0000 [IU] | Freq: Three times a day (TID) | SUBCUTANEOUS | Status: DC
  Administered 2011-02-17 – 2011-02-18 (×3): 3 [IU] via SUBCUTANEOUS

## 2011-02-17 MED ORDER — INSULIN GLARGINE 100 UNIT/ML ~~LOC~~ SOLN
15.0000 [IU] | Freq: Every day | SUBCUTANEOUS | Status: DC
  Administered 2011-02-17: 15 [IU] via SUBCUTANEOUS

## 2011-02-17 NOTE — Progress Notes (Signed)
Stroke Team Progress Note  HISTORY Shelia Webb is an 78 y.o. female a history of atrial fibrillation, hypertension, diabetes mellitus and chronic kidney disease, presenting with acute onset of weakness and numbness involving left side. Patient was well early this morning. She apparently took a nap after breakfast and woke up with the above deficits. No previous history of stroke or TIA. She has not been on anticoagulation. Symptoms have resolved except for minimal subjective numbness involving her left upper extremity. NIH stroke score at this point is 0. She has not been on antiplatelet therapy including no aspirin. CT scan of her head showed no acute intracranial abnormality.  SUBJECTIVE Sleepy this am.  No weakness or numbness in L upper extremity.  OBJECTIVE Most recent Vital Signs: Temp: 98.6 F (37 C) (01/27 0900) Temp src: Oral (01/27 0900) BP: 130/75 mmHg (01/27 0900) Pulse Rate: 89  (01/27 0900) Respiratory Rate: 18 O2 Saturation: 94% LSN: 0900  tPA Given: No  MRankin: 1  Meds . aspirin  325 mg Oral Daily  . calcium-vitamin D  1 tablet Oral Daily  . cefTRIAXone (ROCEPHIN)  IV  1 g Intravenous Q24H  . cycloSPORINE  1 drop Both Eyes BID  . DULoxetine  60 mg Oral Daily  . enoxaparin  40 mg Subcutaneous QHS  . Fluticasone-Salmeterol  1 puff Inhalation Daily  . furosemide  20 mg Oral Daily  . gabapentin  400 mg Oral QHS  . guaiFENesin  600 mg Oral BID  . insulin aspart  0-5 Units Subcutaneous QHS  . insulin aspart  0-9 Units Subcutaneous TID WC  . insulin aspart  3 Units Subcutaneous TID WC  . insulin aspart  4 Units Subcutaneous Once  . insulin glargine  15 Units Subcutaneous Daily  . metoprolol succinate  25 mg Oral Daily  . multivitamins ther. w/minerals  1 tablet Oral Daily  . pantoprazole  40 mg Oral Q1200  . polyvinyl alcohol  1 drop Both Eyes QID  . rOPINIRole  0.25 mg Oral BID  . senna  1 tablet Oral QHS  . Vitamin D (Ergocalciferol)  50,000 Units Oral Q7 days    . DISCONTD: insulin glargine  5 Units Subcutaneous Daily  PRN acetaminophen, diclofenac sodium, oxyCODONE-acetaminophen, polyethylene glycol  CBG (last 3)   Basename 02/17/11 0553 02/16/11 2137 02/16/11 1637  GLUCAP 274* 332* 328*    01/26 0701 - 01/27 0700 In: 860 [P.O.:760; IV Piggyback:100] Out: -    Diet:  Carb Control thin liquids Activity:  Up with assistance DVT Prophylaxis: lovenox   Mental Status:  Alert, oriented, thought content appropriate. Speech fluent without evidence of aphasia. Able to follow 3 step commands without difficulty.  Cranial Nerves:  II- Visual fields grossly intact.  III/IV/VI-Extraocular movements intact. Pupils reactive bilaterally.  V/VII-Smile symmetric  VIII-grossly intact  IX/X-normal gag  XI-bilateral shoulder shrug  XII-midline tongue extension  Motor: 5/5 bilaterally with normal tone and bulk  Sensory: Pinprick and light touch intact throughout, bilaterally  Deep Tendon Reflexes: 2+ UEs, absent patellars and ankles  Plantars: Downgoing bilaterally  Cerebellar: Normal finger-to-nose, normal rapid alternating movements.    Significant Diagnostic Studies: CBC    Component Value Date/Time   WBC 7.6 02/15/2011 1510   RBC 5.08 02/15/2011 1510   HGB 13.6 02/15/2011 1510   HCT 40.5 02/15/2011 1510   PLT 209 02/15/2011 1510   MCV 79.7 02/15/2011 1510   MCH 26.8 02/15/2011 1510   MCHC 33.6 02/15/2011 1510   RDW 14.9 02/15/2011 1510  LYMPHSABS 2.2 02/15/2011 1510   MONOABS 0.6 02/15/2011 1510   EOSABS 0.2 02/15/2011 1510   BASOSABS 0.0 02/15/2011 1510   CMP    Component Value Date/Time   NA 134* 02/15/2011 1510   K 3.8 02/15/2011 1510   CL 97 02/15/2011 1510   CO2 25 02/15/2011 1510   GLUCOSE 303* 02/15/2011 1510   BUN 13 02/15/2011 1510   CREATININE 0.64 02/15/2011 1510   CALCIUM 9.1 02/15/2011 1510   PROT 7.5 02/15/2011 1510   ALBUMIN 3.2* 02/15/2011 1510   AST 16 02/15/2011 1510   ALT 14 02/15/2011 1510   ALKPHOS 121* 02/15/2011 1510    BILITOT 0.2* 02/15/2011 1510   GFRNONAA 78* 02/15/2011 1510   GFRAA >90 02/15/2011 1510   COAGS Lab Results  Component Value Date   INR 0.99 02/15/2011   Lipid Panel    Component Value Date/Time   CHOL 194 02/16/2011 0500   TRIG 287* 02/16/2011 0500   HDL 42 02/16/2011 0500   CHOLHDL 4.6 02/16/2011 0500   VLDL 57* 02/16/2011 0500   LDLCALC 95 02/16/2011 0500   HgbA1C  Lab Results  Component Value Date   HGBA1C 11.5* 02/15/2011    02/15/2011 CT HEAD WITHOUT CONTRAST   There is no evidence for acute hemorrhage, hydrocephalus, mass lesion, or abnormal extra-axial fluid collection.  No definite CT evidence for acute infarction.  Diffuse loss of parenchymal volume is consistent with atrophy. Patchy low attenuation in the deep hemispheric and periventricular white matter is nonspecific, but likely reflects chronic microvascular ischemic demyelination.  Chronic opacification of the maxillary sinus is again noted.  IMPRESSION: Stable.  No acute intracranial abnormality.  Atrophy with chronic small vessel white matter ischemic disease. ERIC A. MANSELL, M.D.   02/15/2011 .  MRI HEAD WITHOUT AND WITH CONTRAST    Findings: No restricted diffusion to suggest acute infarction.  No midline shift, mass effect, evidence of mass lesion, ventriculomegaly, extra-axial collection or acute intracranial hemorrhage.  Cervicomedullary junction and pituitary are within normal limits.  Major intracranial vascular flow voids are preserved.  Generalized cerebral volume loss. Wallace Cullens and white matter signal is within normal limits for age throughout the brain.  No abnormal enhancement identified.  Hyperostosis of the skull.  The ankylosis of the visualized cervical spine via a large anterior osteophytes.  However, there is no spinal stenosis in the visualized cervical spine. Ligamentous hypertrophy about the dens.  Visualized bone marrow signal is within normal limits.  Bilateral maxillary sinus mucosal thickening and inspissated  secretions.  Postoperative changes to the globes.  Negative visualized scalp soft tissues.  IMPRESSION: 1. No acute intracranial abnormality. 2.  Ankylosis of the visualized cervical spine. 3.  Chronic maxillary sinus disease.  H.LEE HALL III, M.D.   ASSESSMENT Ms. IVYLYNN HOPPES is a 78 y.o. female with   1. TIA , probable with L sided weakness resolved.  MRI head neg.  Carotid doppler and 2D echo pending.    2. Afib- rate controlled.  Not on coumadin  3. HTN- controlled  Stroke risk factors: AF, HTN, DM, hyperlipidemia  The patient indicates that she does not know why she was not put on Coumadin. The patient reports that she has not been and the story in several years, has not had any falls. The patient indicates that she does not have a cardiologist.  Hospital day # 2  TREATMENT/PLAN 1. Continue ASA 325 mg daily for secondary stroke prevention. 2. Aggressive risk factor modification, goal A1C 6.5.  Marya Fossa PA-C [facility] [phone] 02/17/2011  10:53 AM  Patient has been seen and examined by Dr. Anne Hahn who agrees with the above recommendations.

## 2011-02-17 NOTE — Progress Notes (Signed)
Subjective: Mild numbness of the left hand.   Objective: Blood pressure 130/75, pulse 89, temperature 98.6 F (37 C), temperature source Oral, resp. rate 18, height 5' (1.524 m), weight 71.2 kg (156 lb 15.5 oz), SpO2 94.00%.  Intake/Output Summary (Last 24 hours) at 02/17/11 1222 Last data filed at 02/17/11 0625  Gross per 24 hour  Intake    300 ml  Output      0 ml  Net    300 ml    General Exam: Comfortable. Respiratory System: Clear. No increased work of breathing. Cardiovascular System: First and second heart sounds heard. Regular rate and rhythm. No JVD/murmurs. Telemetry shows sinus rhythm in the 80s. Gastrointestinal System: Abdomen is non distended, soft and normal bowel sounds heard. Central Nervous System: Alert and oriented. No focal neurological deficits.  Lab Results: Basic Metabolic Panel:  Basename 02/15/11 1510  NA 134*  K 3.8  CL 97  CO2 25  GLUCOSE 303*  BUN 13  CREATININE 0.64  CALCIUM 9.1  MG --  PHOS --   Liver Function Tests:  Basename 02/15/11 1510  AST 16  ALT 14  ALKPHOS 121*  BILITOT 0.2*  PROT 7.5  ALBUMIN 3.2*   No results found for this basename: LIPASE:2,AMYLASE:2 in the last 72 hours No results found for this basename: AMMONIA:2 in the last 72 hours CBC:  Basename 02/15/11 1510  WBC 7.6  NEUTROABS 4.6  HGB 13.6  HCT 40.5  MCV 79.7  PLT 209   Cardiac Enzymes:  Basename 02/16/11 1145 02/16/11 0400 02/15/11 2027  CKTOTAL 30 37 42  CKMB 1.8 2.1 2.0  CKMBINDEX -- -- --  TROPONINI <0.30 <0.30 <0.30   BNP: No results found for this basename: PROBNP:3 in the last 72 hours D-Dimer: No results found for this basename: DDIMER:2 in the last 72 hours CBG:  Basename 02/17/11 1141 02/17/11 0553 02/16/11 2137 02/16/11 1637 02/16/11 1128 02/16/11 0706  GLUCAP 343* 274* 332* 328* 397* 235*   Hemoglobin A1C:  Basename 02/15/11 2027  HGBA1C 11.5*   Fasting Lipid Panel:  Basename 02/16/11 0500  CHOL 194  HDL 42  LDLCALC 95   TRIG 696*  CHOLHDL 4.6  LDLDIRECT --   Thyroid Function Tests: No results found for this basename: TSH,T4TOTAL,FREET4,T3FREE,THYROIDAB in the last 72 hours Anemia Panel: No results found for this basename: VITAMINB12,FOLATE,FERRITIN,TIBC,IRON,RETICCTPCT in the last 72 hours Coagulation:  Basename 02/15/11 1510  LABPROT 13.3  INR 0.99   Urine Drug Screen: Drugs of Abuse  No results found for this basename: labopia,  cocainscrnur,  labbenz,  amphetmu,  thcu,  labbarb    Alcohol Level: No results found for this basename: ETH:2 in the last 72 hours  Micro Results: Recent Results (from the past 240 hour(s))  URINE CULTURE     Status: Normal   Collection Time   02/15/11  3:34 PM      Component Value Range Status Comment   Specimen Description URINE, CATHETERIZED   Final    Special Requests ADDED 02/15/11 1718   Final    Setup Time [id number]   Final    Colony Count >=100,000 COLONIES/ML   Final    Culture     Final    Value: Multiple bacterial morphotypes present, none predominant. Suggest appropriate recollection if clinically indicated.   Report Status 02/16/2011 FINAL   Final   MRSA PCR SCREENING     Status: Normal   Collection Time   02/16/11  4:39 AM  Component Value Range Status Comment   MRSA by PCR NEGATIVE  NEGATIVE  Final     Studies/Results: Dg Chest 2 View  02/15/2011  *RADIOLOGY REPORT*  Clinical Data:  left-sided weakness, stroke like symptoms  CHEST - 2 VIEW  Comparison: 12/05/2010  Findings: Cardiomediastinal silhouette is stable.  Stable chronic interstitial prominence.  No acute infiltrate or pleural effusion. No pulmonary edema.  Stable osteopenia and degenerative changes thoracic spine.  IMPRESSION: No active disease.  No significant change.  Original Report Authenticated By: Natasha Mead, M.D.   Ct Head Wo Contrast  02/15/2011  *RADIOLOGY REPORT*  Clinical Data: Headache with left facial, arm, and leg numbness.  CT HEAD WITHOUT CONTRAST  Technique:   Contiguous axial images were obtained from the base of the skull through the vertex without contrast.  Comparison: 10/15/2009  Findings: There is no evidence for acute hemorrhage, hydrocephalus, mass lesion, or abnormal extra-axial fluid collection.  No definite CT evidence for acute infarction.  Diffuse loss of parenchymal volume is consistent with atrophy. Patchy low attenuation in the deep hemispheric and periventricular white matter is nonspecific, but likely reflects chronic microvascular ischemic demyelination.  Chronic opacification of the maxillary sinus is again noted.  IMPRESSION: Stable.  No acute intracranial abnormality.  Atrophy with chronic small vessel white matter ischemic disease.  Chronic opacification of the maxillary sinuses.  Original Report Authenticated By: ERIC A. MANSELL, M.D.   Mr Laqueta Jean Wo Contrast  02/15/2011  *RADIOLOGY REPORT*  Clinical Data: 78 year old female with left-sided numbness. Weakness.  MRI HEAD WITHOUT AND WITH CONTRAST IMPRESSION: 1. No acute intracranial abnormality. 2.  Ankylosis of the visualized cervical spine. 3.  Chronic maxillary sinus disease.    Carotid Doppler:  Technically difficult due to extremely high bifurcation,tortuosity, and inability for patient to turn neck for optimal imaging. No obvious evidence of significant ICA stenosis. Vertebral arteries were not insonated.    Medications: Scheduled Meds:    . aspirin  325 mg Oral Daily  . calcium-vitamin D  1 tablet Oral Daily  . cefTRIAXone (ROCEPHIN)  IV  1 g Intravenous Q24H  . cycloSPORINE  1 drop Both Eyes BID  . DULoxetine  60 mg Oral Daily  . enoxaparin  40 mg Subcutaneous QHS  . Fluticasone-Salmeterol  1 puff Inhalation Daily  . furosemide  20 mg Oral Daily  . gabapentin  400 mg Oral QHS  . guaiFENesin  600 mg Oral BID  . insulin aspart  0-5 Units Subcutaneous QHS  . insulin aspart  0-9 Units Subcutaneous TID WC  . insulin aspart  3 Units Subcutaneous TID WC  . insulin aspart   4 Units Subcutaneous Once  . insulin glargine  15 Units Subcutaneous Daily  . metoprolol succinate  25 mg Oral Daily  . multivitamins ther. w/minerals  1 tablet Oral Daily  . pantoprazole  40 mg Oral Q1200  . polyvinyl alcohol  1 drop Both Eyes QID  . rOPINIRole  0.25 mg Oral BID  . senna  1 tablet Oral QHS  . Vitamin D (Ergocalciferol)  50,000 Units Oral Q7 days  . DISCONTD: insulin glargine  5 Units Subcutaneous Daily   Continuous Infusions:  PRN Meds:.acetaminophen, diclofenac sodium, oxyCODONE-acetaminophen, polyethylene glycol  Assessment/Plan: 1. TIA: Most of left sided symptoms have resolved. Follow 2-D echocardiogram. Continue full dose aspirin. Neurology followup appreciated. 2. Uncontrolled type 2 diabetes mellitus: Markedly elevated hemoglobin A1c. Reviewed nursing home med rec hardcopy in patients shadow chart. Was not on any medications for diabetes prior  to hospitalization. Increase Lantus to 15 units daily and add NovoLog meal time 3 units 3 times a day. Monitor 3. Urinary tract infection: Discontinue Rocephin after today's doses. Urine culture results are suggestive of contamination. 4. Hypertension: Reasonable control. 5. Pseudohyponatremia secondary to hyperglycemia. 6. DO NOT RESUSCITATE 7. History of atrial fibrillation, currently in sinus rhythm and has not been on Coumadin: Continue aspirin. Unclear why she has not been on Coumadin. Patient is not a reliable historian to assess this. 8. Status post Hartmann's pouch  Disposition: Discharge back to skilled nursing facility pending echo, results. Probably tomorrow.  Discussed with Son, Mr. Francisco Capuchin over the phone, updated care and answered questions.   Camillia Marcy 02/17/2011, 12:22 PM

## 2011-02-18 LAB — GLUCOSE, CAPILLARY
Glucose-Capillary: 322 mg/dL — ABNORMAL HIGH (ref 70–99)
Glucose-Capillary: 369 mg/dL — ABNORMAL HIGH (ref 70–99)

## 2011-02-18 MED ORDER — INSULIN ASPART 100 UNIT/ML ~~LOC~~ SOLN: 6.0000 [IU] | Freq: Three times a day (TID) | SUBCUTANEOUS | Status: DC

## 2011-02-18 MED ORDER — WARFARIN VIDEO: Freq: Once | Status: DC

## 2011-02-18 MED ORDER — OXYCODONE-ACETAMINOPHEN 5-325 MG PO TABS: 1.0000 | ORAL_TABLET | Freq: Four times a day (QID) | ORAL | Status: DC | PRN

## 2011-02-18 MED ORDER — SIMVASTATIN 20 MG PO TABS: 20.0000 mg | ORAL_TABLET | Freq: Every day | ORAL | Status: DC

## 2011-02-18 MED ORDER — INSULIN GLARGINE 100 UNIT/ML ~~LOC~~ SOLN: 15.0000 [IU] | Freq: Every day | SUBCUTANEOUS | Status: DC

## 2011-02-18 MED ORDER — INSULIN ASPART 100 UNIT/ML ~~LOC~~ SOLN
5.0000 [IU] | Freq: Three times a day (TID) | SUBCUTANEOUS | Status: DC
  Administered 2011-02-18 (×2): 5 [IU] via SUBCUTANEOUS

## 2011-02-18 MED ORDER — ALBUTEROL 90 MCG/ACT IN AERS: 2.0000 | INHALATION_SPRAY | Freq: Four times a day (QID) | RESPIRATORY_TRACT | Status: DC | PRN

## 2011-02-18 MED ORDER — INSULIN GLARGINE 100 UNIT/ML ~~LOC~~ SOLN: 20.0000 [IU] | Freq: Every day | SUBCUTANEOUS | Status: DC

## 2011-02-18 MED ORDER — INSULIN GLARGINE 100 UNIT/ML ~~LOC~~ SOLN
20.0000 [IU] | Freq: Every day | SUBCUTANEOUS | Status: DC
  Administered 2011-02-18: 20 [IU] via SUBCUTANEOUS

## 2011-02-18 MED ORDER — ASPIRIN 325 MG PO TABS: 325.0000 mg | ORAL_TABLET | Freq: Every day | ORAL | Status: DC

## 2011-02-18 MED ORDER — WARFARIN SODIUM 4 MG PO TABS
4.0000 mg | ORAL_TABLET | Freq: Every day | ORAL | Status: DC
  Filled 2011-02-18: qty 1

## 2011-02-18 MED ORDER — PATIENT'S GUIDE TO USING COUMADIN BOOK
Freq: Once | Status: DC
  Filled 2011-02-18: qty 1

## 2011-02-18 NOTE — Progress Notes (Signed)
ANTICOAGULATION CONSULT NOTE - Initial Consult  Pharmacy Consult for Coumadin Indication: Afib, TIA history  Allergies  Allergen Reactions  . Penicillins Swelling and Rash    Patient Measurements: Height: 5' (152.4 cm) Weight: 156 lb 15.5 oz (71.2 kg) IBW/kg (Calculated) : 45.5    Vital Signs: Temp: 97.5 F (36.4 C) (01/28 0952) Temp src: Oral (01/28 0952) BP: 113/71 mmHg (01/28 0952) Pulse Rate: 88  (01/28 0952)  Labs:  Basename 02/16/11 1145 02/16/11 0400 02/15/11 2027 02/15/11 1510  HGB -- -- -- 13.6  HCT -- -- -- 40.5  PLT -- -- -- 209  APTT -- -- -- 32  LABPROT -- -- -- 13.3  INR -- -- -- 0.99  HEPARINUNFRC -- -- -- --  CREATININE -- -- -- 0.64  CKTOTAL 30 37 42 --  CKMB 1.8 2.1 2.0 --  TROPONINI <0.30 <0.30 <0.30 --   Estimated Creatinine Clearance: 43.6 ml/min (by C-G formula based on Cr of 0.64).  Medical History: Past Medical History  Diagnosis Date  . Hypertension   . Diabetes mellitus   . Dysrhythmia     a-fib  . Anemia   . Acute respiratory failure   . Chronic kidney disease     Kidney failure  . Allergic rhinitis   . Osteoarthrosis   . Osteoporosis   . Anxiety   . Depression   . Restless leg syndrome   . GERD (gastroesophageal reflux disease)   . Hypertonic bladder   . Peripheral vascular disease   . Coronary atherosclerosis due to lipid rich plaque   . DNR (do not resuscitate)     Medications:  Scheduled:    . aspirin  325 mg Oral Daily  . calcium-vitamin D  1 tablet Oral Daily  . cefTRIAXone (ROCEPHIN)  IV  1 g Intravenous Q24H  . cycloSPORINE  1 drop Both Eyes BID  . DULoxetine  60 mg Oral Daily  . enoxaparin  40 mg Subcutaneous QHS  . Fluticasone-Salmeterol  1 puff Inhalation Daily  . furosemide  20 mg Oral Daily  . gabapentin  400 mg Oral QHS  . guaiFENesin  600 mg Oral BID  . insulin aspart  0-5 Units Subcutaneous QHS  . insulin aspart  0-9 Units Subcutaneous TID WC  . insulin aspart  5 Units Subcutaneous TID WC  .  insulin glargine  20 Units Subcutaneous Daily  . metoprolol succinate  25 mg Oral Daily  . multivitamins ther. w/minerals  1 tablet Oral Daily  . pantoprazole  40 mg Oral Q1200  . polyvinyl alcohol  1 drop Both Eyes QID  . rOPINIRole  0.25 mg Oral BID  . senna  1 tablet Oral QHS  . Vitamin D (Ergocalciferol)  50,000 Units Oral Q7 days  . DISCONTD: insulin aspart  3 Units Subcutaneous TID WC  . DISCONTD: insulin glargine  15 Units Subcutaneous Daily    Assessment: 78 year old female with a history of Afib, HTN, DM, and CKD who presented with TIA.   Beginning Coumadin therapy.   Goal of Therapy:  INR 2-3   Plan:  1) Coumadin 4 mg po Daily 2) Daily PT / INR 3) Begin Coumadin education  Thank you.  Elwin Sleight 02/18/2011,12:10 PM

## 2011-02-18 NOTE — Progress Notes (Signed)
[facility] has been referred to this patient for chronic disease management.  This patient is not appropriate for referral because she is a SNF resident.  For any questions or referrals please contact me at [phone].

## 2011-02-18 NOTE — Progress Notes (Signed)
Clinical Social Work-Please see full assessment in shadow chart-Pt is a resident of [facility]-Service line CSW confirmed with pt at bedside and this CSW contacted facility and son to confirm plan. FL2 updated and will be placed in shadow chart. CSW will facilitate PTAR and contact son at d/c. Jodean Lima, [phone]

## 2011-02-18 NOTE — Discharge Summary (Addendum)
Discharge Summary  Shelia Webb MR#: [id number]  DOB:08-Feb-1921  Date of Admission: 02/15/2011 Date of Discharge: 02/18/2011  Patient's PCP: Terald Sleeper, MD, MD  Attending Physician:Indiana Pechacek  Consults: Neurology: Dr. Noel Christmas   Discharge Diagnoses: 1. TIA 2. Uncontrolled type 2 diabetes mellitus/insulin-dependent 3. Urinary tract infection, treated 4. Hypertension 5. Pseudohyponatremia 6. History of atrial fibrillation 7. Status post Hartmann's pouch  Brief Admitting History and Physical 78 year old female, nursing home resident, history of hypertension, insulin-dependent type 2 diabetes mellitus, chronic kidney disease and Atrial fibrillation who is not on Coumadin, coronary artery disease, peripheral artery disease, osteoarthritis, prior Hartmann's procedure for perforated diverticulitis, bed bound and wheelchair mobile for approximately 3 years, was sent from the nursing facility secondary to start wearing episodes of left-sided and numbness and weakness. Vital signs in the emergency room were stable. By the time she was evaluated in the emergency room her symptoms had almost resolved except for some numbness in the left upper extremity. She was admitted to rule out stroke.  Discharge Medications Current Discharge Medication List    START taking these medications   Details  albuterol (PROVENTIL,VENTOLIN) 90 MCG/ACT inhaler Inhale 2 puffs into the lungs every 6 (six) hours as needed for wheezing.    aspirin 325 MG tablet Take 1 tablet (325 mg total) by mouth daily.      CONTINUE these medications which have CHANGED   Details  insulin aspart (NOVOLOG) 100 UNIT/ML injection Inject 6 Units into the skin 3 (three) times daily with meals.    !! insulin glargine (LANTUS) 100 UNIT/ML injection Inject 15 Units into the skin at bedtime.    !! insulin glargine (LANTUS) 100 UNIT/ML injection Inject 20 Units into the skin daily.    oxyCODONE-acetaminophen  (PERCOCET) 5-325 MG per tablet Take 1 tablet by mouth every 6 (six) hours as needed. For pain Qty: 10 tablet, Refills: 0     !! - Potential duplicate medications found. Please discuss with provider.    CONTINUE these medications which have NOT CHANGED   Details  Artificial Tear Ointment (LACRI-LUBE OP) Place 1 drop into both eyes at bedtime.      calcium-vitamin D (OSCAL WITH D) 500-200 MG-UNIT per tablet Take 1 tablet by mouth daily.      Cholecalciferol 50000 UNITS TABS Take 50,000 Units by mouth once a week. On Monday    cycloSPORINE (RESTASIS) 0.05 % ophthalmic emulsion Place 1 drop into both eyes 2 (two) times daily.      diclofenac sodium (VOLTAREN) 1 % GEL Apply 1 application topically 4 (four) times daily as needed. For left knee pain       DULoxetine (CYMBALTA) 60 MG capsule Take 60 mg by mouth daily.      Fluticasone-Salmeterol (ADVAIR) 100-50 MCG/DOSE AEPB Inhale 1 puff into the lungs daily.      furosemide (LASIX) 20 MG tablet Take 20 mg by mouth daily.      gabapentin (NEURONTIN) 400 MG capsule Take 400 mg by mouth at bedtime.      hydroxypropyl cellulose (LACRISERT) 5 MG INST 5 mg daily.     metoprolol succinate (TOPROL-XL) 25 MG 24 hr tablet Take 25 mg by mouth daily.      Multiple Vitamins-Minerals (MULTIVITAMINS THER. W/MINERALS) TABS Take 1 tablet by mouth daily.      omeprazole (PRILOSEC) 20 MG capsule Take 20 mg by mouth daily.      Polyethyl Glycol-Propyl Glycol (SYSTANE) 0.4-0.3 % GEL Place 1 drop into both eyes 4 (four) times  daily.      polyethylene glycol (MIRALAX / GLYCOLAX) packet Take 17 g by mouth daily as needed. For constipation      rOPINIRole (REQUIP) 0.25 MG tablet Take 0.25 mg by mouth 2 (two) times daily.      senna (SENOKOT) 8.6 MG TABS Take 1 tablet by mouth at bedtime.        STOP taking these medications     aspirin EC 81 MG tablet Comments:  Reason for Stopping:       ipratropium (ATROVENT) 0.02 % nebulizer solution Comments:    Reason for Stopping:          Hospital Course: 1. TIA: Patient was admitted and extensive evaluation was done and stroke was ruled out. Neurology was consulted and recommended increasing her aspirin 325 mg by mouth daily. There is history of atrial fibrillation but patient is not on Coumadin. Do not know if patient has any contraindications for anticoagulation. Will defer the decision regarding anticoagulation to her primary care physician at the nursing facility. Her symptoms have all resolved except for mild numbness of her left hand. 2. Uncontrolled type 2 diabetes mellitus/insulin-dependent: When her initial med rec was sent from the nursing facility there were no diabetic medications on that list. Her hemoglobin A1c was in excess of 11. Obviously her blood sugars have not been controlled as an outpatient. Slight adjustments were made to her Lantus and NovoLog insulin. Recommend close outpatient followup of her blood sugars and adjust medications as deemed necessary. 3. Urinary tract infection: Urine culture was contaminated. Patient was treated with IV Rocephin and has completed 3 days of antibiotics and will be discontinued. She is asymptomatic. 4. Hypertension: Controlled 5. Pseudohyponatremia: Secondary to hyperglycemia. 6. Status post Hartman's pouch: No acute issues related to this. 7. DO NOT RESUSCITATE 8. History of atrial fibrillation: Reviewed her records in E chart and patient had a brief period of atrial fibrillation in June of 2011 when she had septic shock. She has however not been on any anticoagulation. She does have a CHADS 2 score probably of 5. Again decision regarding long-term anticoagulation in this elderly female is to be evaluated by her primary care physician at the skilled nursing facility where she has been for the last 3 years.   Day of Discharge BP 113/71  Pulse 88  Temp(Src) 97.5 F (36.4 C) (Oral)  Resp 18  Ht 5' (1.524 m)  Wt 71.2 kg (156 lb 15.5 oz)  BMI  30.66 kg/m2  SpO2 96%  General Exam: Comfortable.  Respiratory System: Clear. No increased work of breathing.  Cardiovascular System: First and second heart sounds heard. Regular rate and rhythm. No JVD/murmurs. Telemetry shows sinus rhythm in the 80s.  Gastrointestinal System: Abdomen is non distended, soft and normal bowel sounds heard.  Central Nervous System: Alert and oriented. No focal neurological deficits  Results for orders placed during the hospital encounter of 02/15/11 (from the past 48 hour(s))  GLUCOSE, CAPILLARY     Status: Abnormal   Collection Time   02/16/11  4:37 PM      Component Value Range Comment   Glucose-Capillary 328 (*) 70 - 99 (mg/dL)   GLUCOSE, CAPILLARY     Status: Abnormal   Collection Time   02/16/11  9:37 PM      Component Value Range Comment   Glucose-Capillary 332 (*) 70 - 99 (mg/dL)   GLUCOSE, CAPILLARY     Status: Abnormal   Collection Time   02/17/11  5:53 AM  Component Value Range Comment   Glucose-Capillary 274 (*) 70 - 99 (mg/dL)   GLUCOSE, CAPILLARY     Status: Abnormal   Collection Time   02/17/11 11:41 AM      Component Value Range Comment   Glucose-Capillary 343 (*) 70 - 99 (mg/dL)   GLUCOSE, CAPILLARY     Status: Abnormal   Collection Time   02/17/11  4:27 PM      Component Value Range Comment   Glucose-Capillary 333 (*) 70 - 99 (mg/dL)   GLUCOSE, CAPILLARY     Status: Abnormal   Collection Time   02/17/11  9:22 PM      Component Value Range Comment   Glucose-Capillary 240 (*) 70 - 99 (mg/dL)   GLUCOSE, CAPILLARY     Status: Abnormal   Collection Time   02/18/11  6:42 AM      Component Value Range Comment   Glucose-Capillary 322 (*) 70 - 99 (mg/dL)   GLUCOSE, CAPILLARY     Status: Abnormal   Collection Time   02/18/11 11:39 AM      Component Value Range Comment   Glucose-Capillary 369 (*) 70 - 99 (mg/dL)    Lab data:  1. CBG is range from 240-369 mg/dL. 2. CMP significant for sodium 134, BUN 13, creatinine 0.64, alkaline  phosphatase 121, albumin 3.2 and total bilirubin 0.2 3. Cardiac enzymes were cycled and negative. 4. Lipid panel shows cholesterol 194, triglycerides 287, HDL 42, LDL 95 and VLDL 57 5. CBC within normal limits 6. Hemoglobin A1c 11.5 7. Urinalysis showed many yeast, too numerous to count white blood cells and many bacteria.   CT of the brain Stable. No acute intracranial abnormality. Atrophy with chronic small vessel white matter ischemic disease. Chronic opacification of the maxillary sinuses.  MRI of the brain 1. No acute intracranial abnormality. 2. Ankylosis of the visualized cervical spine. 3. Chronic maxillary sinus disease.   2D Echocardiogram   Study Conclusions  Left ventricle: The cavity size was normal. There was moderate concentric hypertrophy. Systolic function was vigorous. The estimated ejection fraction was in the range of 65% to 70%. Although no diagnostic regional wall motion abnormality was identified, this possibility cannot be completely excluded on the basis of this study. There is a mild to moderate left ventricular outflow tract gradient of peak. Doppler parameters are consistent with abnormal left ventricular relaxation (grade 1 diastolic dysfunction). Doppler parameters are consistent with high ventricular filling pressure.  Impressions:  - No cardiac source of emboli was indentified.   Carotid Doppler Technically difficult due to extremely high bifurcation,tortuosity, and inability for patient to turn neck for optimal imaging. No obvious evidence of significant ICA stenosis. Vertebral arteries were not insonated. CXR No active disease. No significant change.  EKG normal sinus rhythm TCD: Will be done prior to discharge    Disposition: Discharge to skilled nursing facility in stable condition  Diet: Heart healthy and diabetic  Activity: Increase activity gradually  Follow-up Appts: Discharge Orders    Future Orders Please Complete By  Expires   Diet - low sodium heart healthy      Diet Carb Modified      Increase activity slowly      Call MD for:  temperature >100.4      Call MD for:  severe uncontrolled pain         TESTS THAT NEED FOLLOW-UP  1. Blood sugar control 2. Primary care physician at the skilled nursing facilities to evaluate carefully whether  patient can be safely started on Coumadin. She was admitted with a TIA and has History of atrial fibrillation. Prior to hospitalization she was on aspirin 81 mg by mouth daily. If there are no contraindications to anticoagulation, recommend starting on Coumadin. Also discussed with patient's son Mr. Francisco Capuchin, who indicated that her diagnosis of atrial fibrillation is probably a remote one. Updated care and answered all questions. 3. Consider starting patient on beta blockers based on 2-D echo results.  Time spent on discharge, talking to the patient, and coordinating care: 45 mins.   SignedMarcellus Scott, MD 02/18/2011, 11:59 AM

## 2011-02-18 NOTE — Progress Notes (Signed)
Stroke Team Progress Note  HISTORY Shelia Webb is an 78 y.o. female a history of atrial fibrillation, hypertension, diabetes mellitus and chronic kidney disease, presenting with acute onset of weakness and numbness involving left side. Patient was well early this morning. She apparently took a nap after breakfast and woke up with the above deficits. No previous history of stroke or TIA. She has not been on anticoagulation. Symptoms have resolved except for minimal subjective numbness involving her left upper extremity. NIH stroke score at this point is 0. She has not been on antiplatelet therapy including no aspirin. CT scan of her head showed no acute intracranial abnormality.  SUBJECTIVE No family is at the bedside. Overall she feels her condition is stable. She has never been on coumadin nor has she had any issues with bleeding/low blood per pt. She resides in a NH. She is w/c bound.  OBJECTIVE Most recent Vital Signs: Temp: 98.2 F (36.8 C) (01/28 0558) Temp src: Oral (01/28 0558) BP: 104/68 mmHg (01/28 0558) Pulse Rate: 91  (01/28 0558) Respiratory Rate: 18 O2 Saturation: 93%  CBG (last 3)  Basename 02/18/11 0642 02/17/11 2122 02/17/11 1627  GLUCAP 322* 240* 333*   Intake/Output from previous day: 01/27 0701 - 01/28 0700 In: 50 [IV Piggyback:50] Out: -   IV Fluid Intake:    Medications    . aspirin  325 mg Oral Daily  . calcium-vitamin D  1 tablet Oral Daily  . cefTRIAXone (ROCEPHIN)  IV  1 g Intravenous Q24H  . cycloSPORINE  1 drop Both Eyes BID  . DULoxetine  60 mg Oral Daily  . enoxaparin  40 mg Subcutaneous QHS  . Fluticasone-Salmeterol  1 puff Inhalation Daily  . furosemide  20 mg Oral Daily  . gabapentin  400 mg Oral QHS  . guaiFENesin  600 mg Oral BID  . insulin aspart  0-5 Units Subcutaneous QHS  . insulin aspart  0-9 Units Subcutaneous TID WC  . insulin aspart  3 Units Subcutaneous TID WC  . insulin glargine  15 Units Subcutaneous Daily  . metoprolol  succinate  25 mg Oral Daily  . multivitamins ther. w/minerals  1 tablet Oral Daily  . pantoprazole  40 mg Oral Q1200  . polyvinyl alcohol  1 drop Both Eyes QID  . rOPINIRole  0.25 mg Oral BID  . senna  1 tablet Oral QHS  . Vitamin D (Ergocalciferol)  50,000 Units Oral Q7 days  . DISCONTD: insulin glargine  5 Units Subcutaneous Daily  PRN acetaminophen, diclofenac sodium, oxyCODONE-acetaminophen, polyethylene glycol  Diet:  Carb Control thin liquids Activity:   Up with assistance DVT Prophylaxis:  Lovenox 40 mg sq daily   Significant Diagnostic Studies: CBC    Component Value Date/Time   WBC 7.6 02/15/2011 1510   RBC 5.08 02/15/2011 1510   HGB 13.6 02/15/2011 1510   HCT 40.5 02/15/2011 1510   PLT 209 02/15/2011 1510   MCV 79.7 02/15/2011 1510   MCH 26.8 02/15/2011 1510   MCHC 33.6 02/15/2011 1510   RDW 14.9 02/15/2011 1510   LYMPHSABS 2.2 02/15/2011 1510   MONOABS 0.6 02/15/2011 1510   EOSABS 0.2 02/15/2011 1510   BASOSABS 0.0 02/15/2011 1510   CMP    Component Value Date/Time   NA 134* 02/15/2011 1510   K 3.8 02/15/2011 1510   CL 97 02/15/2011 1510   CO2 25 02/15/2011 1510   GLUCOSE 303* 02/15/2011 1510   BUN 13 02/15/2011 1510   CREATININE 0.64 02/15/2011 1510  CALCIUM 9.1 02/15/2011 1510   PROT 7.5 02/15/2011 1510   ALBUMIN 3.2* 02/15/2011 1510   AST 16 02/15/2011 1510   ALT 14 02/15/2011 1510   ALKPHOS 121* 02/15/2011 1510   BILITOT 0.2* 02/15/2011 1510   GFRNONAA 78* 02/15/2011 1510   GFRAA >90 02/15/2011 1510   COAGS Lab Results  Component Value Date   INR 0.99 02/15/2011   INR 1.43 12/25/2010   INR 1.27 12/24/2010   Lipid Panel    Component Value Date/Time   CHOL 194 02/16/2011 0500   TRIG 287* 02/16/2011 0500   HDL 42 02/16/2011 0500   CHOLHDL 4.6 02/16/2011 0500   VLDL 57* 02/16/2011 0500   LDLCALC 95 02/16/2011 0500   HgbA1C  Lab Results  Component Value Date   HGBA1C 11.5* 02/15/2011   Urine Drug Screen  No results found for this basename: labopia, cocainscrnur,  labbenz, amphetmu, thcu, labbarb    Alcohol Level No results found for this basename: eth   CT of the brain  Stable.  No acute intracranial abnormality.  Atrophy with chronic small vessel white matter ischemic disease.  Chronic opacification of the maxillary sinuses.   MRI of the brain   1. No acute intracranial abnormality. 2.  Ankylosis of the visualized cervical spine. 3.  Chronic maxillary sinus disease.   MRA of the brain  Not orderd  2D Echocardiogram  ordered   Carotid Doppler  ordered   CXR   No active disease.  No significant change.   EKG  normal sinus rhythm.   Physical Exam   Pleasant middle-aged lady currently not in distress. Afebrile. Head is nontraumatic. Neck is supple without bruit. Cardiac exam is regular heart sounds no murmur. Lungs clear to auscultation. Distal pulses well felt. Neurological exam Awake  Alert oriented x 3. Normal speech and language.eye movements full without nystagmus. Face symmetric. Tongue midline. Normal strength, tone, reflexes and coordination. Normal sensation. Gait deferred.  ASSESSMENT Shelia Webb is a 78 y.o. female with a right brain TIA, left side hemiparesis resolved. MRI negative. TIA  secondary to atrial fibrillation. On aspirin 325 mg orally every day for secondary stroke prevention.  Diabetes, uncontrolled, HgbA1c elevated atrial fibrillation, rate controlled, not on coumadin hypertension  Hospital day # 3  TREATMENT/PLAN Start warfarin for secondary stroke prevention. No bridging required. Follow up stroke workup. No need for MRA. Will check TCD.  Joaquin Music, ANP-BC, GNP-BC [facility] Pager: [phone] 02/18/2011 8:24 AM  Dr. Delia Heady, Stroke Center Medical Director, has personally reviewed chart, pertinent data, examined the patient and developed the plan of care.

## 2011-02-18 NOTE — Progress Notes (Signed)
*  PRELIMINARY RESULTS* Echocardiogram 2D Echocardiogram has been performed.  Glean Salen [facility] 02/18/2011, 10:51 AM

## 2011-02-18 NOTE — Progress Notes (Signed)
VASCULAR LAB   TCD completed.  Terance Hart RVT 02/18/2011 2:43 PM

## 2011-02-18 NOTE — Progress Notes (Signed)
Clinical Social Worker facilitated pt discharge needs including contacting facility, family, and arranging ambulance transportation to [facility]. No further social work needs at this time.   Suzanna Byrd, MSW, LCSWA  Clinical Social Work [phone] 

## 2011-02-18 NOTE — Progress Notes (Signed)
Utilization review completed. Shelia Webb 02/18/2011 

## 2011-02-19 LAB — GLUCOSE, CAPILLARY: Glucose-Capillary: 375 mg/dL — ABNORMAL HIGH (ref 70–99)

## 2011-03-22 ENCOUNTER — Emergency Department (HOSPITAL_COMMUNITY): Payer: Medicare Other

## 2011-03-22 ENCOUNTER — Encounter (HOSPITAL_COMMUNITY): Payer: Self-pay

## 2011-03-22 ENCOUNTER — Inpatient Hospital Stay (HOSPITAL_COMMUNITY)
Admission: EM | Admit: 2011-03-22 | Discharge: 2011-03-25 | DRG: 065 | Disposition: A | Discharge status: Skilled Nursing Facility | Payer: Medicare Other | Attending: Internal Medicine | Admitting: Internal Medicine

## 2011-03-22 DIAGNOSIS — I639 Cerebral infarction, unspecified: Secondary | ICD-10-CM | POA: Diagnosis present

## 2011-03-22 DIAGNOSIS — K219 Gastro-esophageal reflux disease without esophagitis: Secondary | ICD-10-CM | POA: Diagnosis present

## 2011-03-22 DIAGNOSIS — Z6830 Body mass index (BMI) 30.0-30.9, adult: Secondary | ICD-10-CM

## 2011-03-22 DIAGNOSIS — Z7982 Long term (current) use of aspirin: Secondary | ICD-10-CM

## 2011-03-22 DIAGNOSIS — M1990 Unspecified osteoarthritis, unspecified site: Secondary | ICD-10-CM | POA: Diagnosis present

## 2011-03-22 DIAGNOSIS — Z8673 Personal history of transient ischemic attack (TIA), and cerebral infarction without residual deficits: Secondary | ICD-10-CM

## 2011-03-22 DIAGNOSIS — M810 Age-related osteoporosis without current pathological fracture: Secondary | ICD-10-CM | POA: Diagnosis present

## 2011-03-22 DIAGNOSIS — Z933 Colostomy status: Secondary | ICD-10-CM

## 2011-03-22 DIAGNOSIS — E119 Type 2 diabetes mellitus without complications: Secondary | ICD-10-CM

## 2011-03-22 DIAGNOSIS — Z794 Long term (current) use of insulin: Secondary | ICD-10-CM

## 2011-03-22 DIAGNOSIS — I83009 Varicose veins of unspecified lower extremity with ulcer of unspecified site: Secondary | ICD-10-CM

## 2011-03-22 DIAGNOSIS — R269 Unspecified abnormalities of gait and mobility: Secondary | ICD-10-CM

## 2011-03-22 DIAGNOSIS — I739 Peripheral vascular disease, unspecified: Secondary | ICD-10-CM | POA: Diagnosis present

## 2011-03-22 DIAGNOSIS — K222 Esophageal obstruction: Secondary | ICD-10-CM

## 2011-03-22 DIAGNOSIS — L97909 Non-pressure chronic ulcer of unspecified part of unspecified lower leg with unspecified severity: Secondary | ICD-10-CM

## 2011-03-22 DIAGNOSIS — G2581 Restless legs syndrome: Secondary | ICD-10-CM | POA: Diagnosis present

## 2011-03-22 DIAGNOSIS — Z993 Dependence on wheelchair: Secondary | ICD-10-CM

## 2011-03-22 DIAGNOSIS — N318 Other neuromuscular dysfunction of bladder: Secondary | ICD-10-CM | POA: Diagnosis present

## 2011-03-22 DIAGNOSIS — G459 Transient cerebral ischemic attack, unspecified: Secondary | ICD-10-CM

## 2011-03-22 DIAGNOSIS — I129 Hypertensive chronic kidney disease with stage 1 through stage 4 chronic kidney disease, or unspecified chronic kidney disease: Secondary | ICD-10-CM | POA: Diagnosis present

## 2011-03-22 DIAGNOSIS — IMO0001 Reserved for inherently not codable concepts without codable children: Secondary | ICD-10-CM | POA: Diagnosis present

## 2011-03-22 DIAGNOSIS — F3289 Other specified depressive episodes: Secondary | ICD-10-CM | POA: Diagnosis present

## 2011-03-22 DIAGNOSIS — Z79899 Other long term (current) drug therapy: Secondary | ICD-10-CM

## 2011-03-22 DIAGNOSIS — Z9849 Cataract extraction status, unspecified eye: Secondary | ICD-10-CM

## 2011-03-22 DIAGNOSIS — N390 Urinary tract infection, site not specified: Secondary | ICD-10-CM | POA: Diagnosis present

## 2011-03-22 DIAGNOSIS — F329 Major depressive disorder, single episode, unspecified: Secondary | ICD-10-CM | POA: Diagnosis present

## 2011-03-22 DIAGNOSIS — I2510 Atherosclerotic heart disease of native coronary artery without angina pectoris: Secondary | ICD-10-CM | POA: Diagnosis present

## 2011-03-22 DIAGNOSIS — Z880 Allergy status to penicillin: Secondary | ICD-10-CM

## 2011-03-22 DIAGNOSIS — I10 Essential (primary) hypertension: Secondary | ICD-10-CM | POA: Diagnosis present

## 2011-03-22 DIAGNOSIS — M1710 Unilateral primary osteoarthritis, unspecified knee: Secondary | ICD-10-CM | POA: Diagnosis present

## 2011-03-22 DIAGNOSIS — K648 Other hemorrhoids: Secondary | ICD-10-CM

## 2011-03-22 DIAGNOSIS — R471 Dysarthria and anarthria: Secondary | ICD-10-CM

## 2011-03-22 DIAGNOSIS — R1310 Dysphagia, unspecified: Secondary | ICD-10-CM | POA: Diagnosis present

## 2011-03-22 DIAGNOSIS — G20 Parkinson's disease: Secondary | ICD-10-CM | POA: Diagnosis present

## 2011-03-22 DIAGNOSIS — F411 Generalized anxiety disorder: Secondary | ICD-10-CM | POA: Diagnosis present

## 2011-03-22 DIAGNOSIS — I6350 Cerebral infarction due to unspecified occlusion or stenosis of unspecified cerebral artery: Principal | ICD-10-CM | POA: Diagnosis present

## 2011-03-22 DIAGNOSIS — N189 Chronic kidney disease, unspecified: Secondary | ICD-10-CM | POA: Diagnosis present

## 2011-03-22 DIAGNOSIS — G20A1 Parkinson's disease without dyskinesia, without mention of fluctuations: Secondary | ICD-10-CM | POA: Diagnosis present

## 2011-03-22 DIAGNOSIS — Z66 Do not resuscitate: Secondary | ICD-10-CM | POA: Diagnosis present

## 2011-03-22 DIAGNOSIS — D126 Benign neoplasm of colon, unspecified: Secondary | ICD-10-CM

## 2011-03-22 DIAGNOSIS — E785 Hyperlipidemia, unspecified: Secondary | ICD-10-CM | POA: Diagnosis present

## 2011-03-22 DIAGNOSIS — K5730 Diverticulosis of large intestine without perforation or abscess without bleeding: Secondary | ICD-10-CM

## 2011-03-22 DIAGNOSIS — H04129 Dry eye syndrome of unspecified lacrimal gland: Secondary | ICD-10-CM

## 2011-03-22 DIAGNOSIS — I4891 Unspecified atrial fibrillation: Secondary | ICD-10-CM | POA: Diagnosis present

## 2011-03-22 DIAGNOSIS — Z7902 Long term (current) use of antithrombotics/antiplatelets: Secondary | ICD-10-CM

## 2011-03-22 LAB — DIFFERENTIAL
Basophils Absolute: 0 10*3/uL (ref 0.0–0.1)
Eosinophils Relative: 0 % (ref 0–5)
Lymphocytes Relative: 28 % (ref 12–46)
Lymphs Abs: 3.5 10*3/uL (ref 0.7–4.0)
Neutro Abs: 7.9 10*3/uL — ABNORMAL HIGH (ref 1.7–7.7)

## 2011-03-22 LAB — CBC
HCT: 43.9 % (ref 36.0–46.0)
Hemoglobin: 14.8 g/dL (ref 12.0–15.0)
MCV: 80.2 fL (ref 78.0–100.0)
Platelets: 216 10*3/uL (ref 150–400)
RBC: 5.71 MIL/uL — ABNORMAL HIGH (ref 3.87–5.11)
RDW: 16.3 % — ABNORMAL HIGH (ref 11.5–15.5)
WBC: 12.4 10*3/uL — ABNORMAL HIGH (ref 4.0–10.5)
WBC: 12.2 10*3/uL — ABNORMAL HIGH (ref 4.0–10.5)

## 2011-03-22 LAB — COMPREHENSIVE METABOLIC PANEL
ALT: 22 U/L (ref 0–35)
AST: 19 U/L (ref 0–37)
Alkaline Phosphatase: 106 U/L (ref 39–117)
CO2: 26 mEq/L (ref 19–32)
Chloride: 98 mEq/L (ref 96–112)
GFR calc Af Amer: 88 mL/min — ABNORMAL LOW (ref >90)
GFR calc non Af Amer: 76 mL/min — ABNORMAL LOW (ref >90)
Glucose, Bld: 203 mg/dL — ABNORMAL HIGH (ref 70–99)
Potassium: 3.7 mEq/L (ref 3.5–5.1)
Sodium: 135 mEq/L (ref 135–145)
Total Bilirubin: 0.3 mg/dL (ref 0.3–1.2)

## 2011-03-22 LAB — URINE MICROSCOPIC-ADD ON

## 2011-03-22 LAB — URINALYSIS, ROUTINE W REFLEX MICROSCOPIC
Ketones, ur: NEGATIVE mg/dL
Nitrite: NEGATIVE
pH: 5.5 (ref 5.0–8.0)

## 2011-03-22 LAB — CREATININE, SERUM
GFR calc Af Amer: 89 mL/min — ABNORMAL LOW (ref >90)
GFR calc non Af Amer: 77 mL/min — ABNORMAL LOW (ref >90)

## 2011-03-22 MED ORDER — ROPINIROLE HCL 0.25 MG PO TABS
0.2500 mg | ORAL_TABLET | Freq: Two times a day (BID) | ORAL | Status: DC
  Administered 2011-03-23 – 2011-03-25 (×5): 0.25 mg via ORAL
  Filled 2011-03-22 (×7): qty 1

## 2011-03-22 MED ORDER — OXYCODONE-ACETAMINOPHEN 5-325 MG PO TABS
1.0000 | ORAL_TABLET | Freq: Four times a day (QID) | ORAL | Status: DC | PRN
  Administered 2011-03-23: 1 via ORAL
  Filled 2011-03-22: qty 1

## 2011-03-22 MED ORDER — POLYETHYL GLYCOL-PROPYL GLYCOL 0.4-0.3 % OP GEL: 1.0000 [drp] | Freq: Four times a day (QID) | OPHTHALMIC | Status: DC

## 2011-03-22 MED ORDER — CALCIUM CARBONATE-VITAMIN D 500-200 MG-UNIT PO TABS
1.0000 | ORAL_TABLET | Freq: Every day | ORAL | Status: DC
  Administered 2011-03-23 – 2011-03-25 (×3): 1 via ORAL
  Filled 2011-03-22 (×3): qty 1

## 2011-03-22 MED ORDER — CIPROFLOXACIN IN D5W 400 MG/200ML IV SOLN
400.0000 mg | Freq: Two times a day (BID) | INTRAVENOUS | Status: DC
  Administered 2011-03-22 – 2011-03-24 (×5): 400 mg via INTRAVENOUS
  Filled 2011-03-22 (×7): qty 200

## 2011-03-22 MED ORDER — ASPIRIN 325 MG PO TABS
325.0000 mg | ORAL_TABLET | Freq: Every day | ORAL | Status: DC
  Administered 2011-03-23 – 2011-03-24 (×2): 325 mg via ORAL
  Filled 2011-03-22 (×2): qty 1

## 2011-03-22 MED ORDER — ALBUTEROL 90 MCG/ACT IN AERS: 2.0000 | INHALATION_SPRAY | Freq: Four times a day (QID) | RESPIRATORY_TRACT | Status: DC | PRN

## 2011-03-22 MED ORDER — ADULT MULTIVITAMIN W/MINERALS CH
1.0000 | ORAL_TABLET | Freq: Every day | ORAL | Status: DC
  Administered 2011-03-23 – 2011-03-25 (×3): 1 via ORAL
  Filled 2011-03-22 (×3): qty 1

## 2011-03-22 MED ORDER — SENNOSIDES-DOCUSATE SODIUM 8.6-50 MG PO TABS: 1.0000 | ORAL_TABLET | Freq: Every evening | ORAL | Status: DC | PRN

## 2011-03-22 MED ORDER — GABAPENTIN 400 MG PO CAPS
400.0000 mg | ORAL_CAPSULE | Freq: Every day | ORAL | Status: DC
  Administered 2011-03-23: 400 mg via ORAL
  Filled 2011-03-22 (×5): qty 1

## 2011-03-22 MED ORDER — DEXTROSE 5 % IV SOLN
1.0000 g | Freq: Once | INTRAVENOUS | Status: AC
  Administered 2011-03-22: 1 g via INTRAVENOUS
  Filled 2011-03-22: qty 10

## 2011-03-22 MED ORDER — INSULIN ASPART 100 UNIT/ML ~~LOC~~ SOLN
0.0000 [IU] | Freq: Three times a day (TID) | SUBCUTANEOUS | Status: DC
  Administered 2011-03-23 – 2011-03-24 (×3): 3 [IU] via SUBCUTANEOUS
  Administered 2011-03-24: 5 [IU] via SUBCUTANEOUS
  Filled 2011-03-22: qty 3

## 2011-03-22 MED ORDER — ASPIRIN 300 MG RE SUPP
300.0000 mg | Freq: Every day | RECTAL | Status: DC
  Administered 2011-03-23: 300 mg via RECTAL
  Filled 2011-03-22 (×5): qty 1

## 2011-03-22 MED ORDER — DICLOFENAC SODIUM 1 % TD GEL
1.0000 "application " | Freq: Four times a day (QID) | TRANSDERMAL | Status: DC | PRN
  Administered 2011-03-24: 1 via TOPICAL
  Filled 2011-03-22: qty 100

## 2011-03-22 MED ORDER — DULOXETINE HCL 60 MG PO CPEP
60.0000 mg | ORAL_CAPSULE | Freq: Every day | ORAL | Status: DC
  Administered 2011-03-23 – 2011-03-25 (×3): 60 mg via ORAL
  Filled 2011-03-22 (×3): qty 1

## 2011-03-22 MED ORDER — FLUTICASONE-SALMETEROL 100-50 MCG/DOSE IN AEPB
1.0000 | INHALATION_SPRAY | Freq: Every day | RESPIRATORY_TRACT | Status: DC
  Administered 2011-03-22 – 2011-03-25 (×5): 1 via RESPIRATORY_TRACT

## 2011-03-22 MED ORDER — ONDANSETRON HCL 4 MG/2ML IJ SOLN
4.0000 mg | Freq: Three times a day (TID) | INTRAMUSCULAR | Status: AC | PRN
  Administered 2011-03-22: 4 mg via INTRAVENOUS
  Filled 2011-03-22: qty 2

## 2011-03-22 MED ORDER — SODIUM CHLORIDE 0.9 % IV SOLN
INTRAVENOUS | Status: DC
  Administered 2011-03-22 – 2011-03-24 (×2): via INTRAVENOUS

## 2011-03-22 MED ORDER — POLYVINYL ALCOHOL 1.4 % OP SOLN
1.0000 [drp] | Freq: Four times a day (QID) | OPHTHALMIC | Status: DC
  Administered 2011-03-22 – 2011-03-25 (×10): 1 [drp] via OPHTHALMIC
  Filled 2011-03-22: qty 15

## 2011-03-22 MED ORDER — FLUTICASONE-SALMETEROL 100-50 MCG/DOSE IN AEPB
1.0000 | INHALATION_SPRAY | Freq: Every day | RESPIRATORY_TRACT | Status: DC
  Filled 2011-03-22: qty 14

## 2011-03-22 MED ORDER — THERA M PLUS PO TABS: 1.0000 | ORAL_TABLET | Freq: Every day | ORAL | Status: DC

## 2011-03-22 MED ORDER — ALBUTEROL SULFATE HFA 108 (90 BASE) MCG/ACT IN AERS
2.0000 | INHALATION_SPRAY | Freq: Four times a day (QID) | RESPIRATORY_TRACT | Status: DC | PRN
  Filled 2011-03-22: qty 6.7

## 2011-03-22 MED ORDER — SODIUM CHLORIDE 0.9 % IV SOLN
INTRAVENOUS | Status: AC
  Administered 2011-03-22: 19:00:00 via INTRAVENOUS

## 2011-03-22 MED ORDER — SENNA 8.6 MG PO TABS
1.0000 | ORAL_TABLET | Freq: Every day | ORAL | Status: DC
  Administered 2011-03-23 – 2011-03-24 (×2): 8.6 mg via ORAL
  Filled 2011-03-22 (×4): qty 1

## 2011-03-22 MED ORDER — PREDNISONE 10 MG PO TABS
10.0000 mg | ORAL_TABLET | Freq: Two times a day (BID) | ORAL | Status: DC
  Administered 2011-03-23 – 2011-03-25 (×5): 10 mg via ORAL
  Filled 2011-03-22 (×7): qty 1

## 2011-03-22 MED ORDER — ENOXAPARIN SODIUM 40 MG/0.4ML ~~LOC~~ SOLN
40.0000 mg | SUBCUTANEOUS | Status: DC
  Administered 2011-03-22 – 2011-03-24 (×3): 40 mg via SUBCUTANEOUS
  Filled 2011-03-22 (×4): qty 0.4

## 2011-03-22 MED ORDER — SIMVASTATIN 20 MG PO TABS
20.0000 mg | ORAL_TABLET | Freq: Every day | ORAL | Status: DC
  Administered 2011-03-23: 20 mg via ORAL
  Filled 2011-03-22 (×4): qty 1

## 2011-03-22 MED ORDER — POLYETHYLENE GLYCOL 3350 17 G PO PACK
17.0000 g | PACK | Freq: Every day | ORAL | Status: DC | PRN
  Filled 2011-03-22: qty 1

## 2011-03-22 MED ORDER — TRAZODONE 25 MG HALF TABLET
25.0000 mg | ORAL_TABLET | Freq: Every evening | ORAL | Status: DC | PRN
  Administered 2011-03-23: 25 mg via ORAL
  Filled 2011-03-22: qty 1

## 2011-03-22 MED ORDER — VITAMIN D (ERGOCALCIFEROL) 1.25 MG (50000 UNIT) PO CAPS
50000.0000 [IU] | ORAL_CAPSULE | ORAL | Status: DC
  Administered 2011-03-25: 50000 [IU] via ORAL
  Filled 2011-03-22: qty 1

## 2011-03-22 MED ORDER — FUROSEMIDE 20 MG PO TABS
20.0000 mg | ORAL_TABLET | Freq: Every day | ORAL | Status: DC
  Administered 2011-03-23 – 2011-03-24 (×2): 20 mg via ORAL
  Filled 2011-03-22 (×2): qty 1

## 2011-03-22 MED ORDER — PANTOPRAZOLE SODIUM 40 MG PO TBEC
40.0000 mg | DELAYED_RELEASE_TABLET | Freq: Every day | ORAL | Status: DC
  Administered 2011-03-23 – 2011-03-25 (×3): 40 mg via ORAL
  Filled 2011-03-22 (×3): qty 1

## 2011-03-22 MED ORDER — CYCLOSPORINE 0.05 % OP EMUL
1.0000 [drp] | Freq: Two times a day (BID) | OPHTHALMIC | Status: DC
  Administered 2011-03-22 – 2011-03-24 (×4): 1 [drp] via OPHTHALMIC
  Administered 2011-03-24: 10:00:00 via OPHTHALMIC
  Administered 2011-03-25: 1 [drp] via OPHTHALMIC
  Filled 2011-03-22 (×7): qty 1

## 2011-03-22 NOTE — ED Notes (Signed)
The pt returned from xray she is alert her speech is very difficult to understand but she is oriented.  She doesn't need anything at present.  Skin warm and dry

## 2011-03-22 NOTE — ED Notes (Signed)
The pt remains alert and her speech remains very diffiuclt  To understand.  Her gaze is straight ahead and she does not look to the lt.  When she is spoken to from the rt side she makes good eye contact.  She is moving all extremities.  She  Just had zofran iv and her rocephin i gm has infused.  She has some minor  Pain or discomfort in her  Rt great toe/  footy removed no tightness in  footy no redness of her rt great toe.  No distress

## 2011-03-22 NOTE — ED Provider Notes (Signed)
History     CSN: [id number]  Arrival date & time 03/22/11  1405   First MD Initiated Contact with Patient 03/22/11 1409      Chief Complaint  Patient presents with  . Dysphagia    (Consider location/radiation/quality/duration/timing/severity/associated sxs/prior treatment) Patient is a 78 y.o. female presenting with weakness. The history is provided by the patient and the EMS personnel.  Weakness The primary symptoms include focal weakness and speech change. Primary symptoms do not include headaches, loss of consciousness, altered mental status, seizures, dizziness, visual change, paresthesias, fever, nausea or vomiting. The symptoms began yesterday.  Additional symptoms include weakness. Additional symptoms do not include neck stiffness.  Pt states since yesterday she has had difeculty speaking and swallowing. Denies any trauma or injuries. Denies swelling of the tongue. Denies previous similar symptoms. No fever, chills, headache, visual changes, facial drooping, weakness in arms. Chronic lower extremity weakness.   Past Medical History  Diagnosis Date  . Hypertension   . Diabetes mellitus   . Dysrhythmia     a-fib  . Anemia   . Acute respiratory failure   . Chronic kidney disease     Kidney failure  . Allergic rhinitis   . Osteoarthrosis   . Osteoporosis   . Anxiety   . Depression   . Restless leg syndrome   . GERD (gastroesophageal reflux disease)   . Hypertonic bladder   . Peripheral vascular disease   . Coronary atherosclerosis due to lipid rich plaque   . DNR (do not resuscitate)     Past Surgical History  Procedure Date  . Colon surgery     has colostomy  . Abdominal hysterectomy   . Appendectomy   . Tonsillectomy   . Cardiac catheterization     bilateral cataract surgery  . Diaphragmatic hernia repair     with gangrene  . Orif patella 12/07/2010    Procedure: OPEN REDUCTION INTERNAL (ORIF) FIXATION PATELLA;  Surgeon: Nadara Mustard, MD;  Location: [facility] OR;   Service: Orthopedics;  Laterality: Left;  Left Knee Hamstring Release and Open Reduction Internal Fixation Patella  . Orif patella 12/21/2010    Procedure: OPEN REDUCTION INTERNAL (ORIF) FIXATION PATELLA;  Surgeon: Nadara Mustard, MD;  Location: [facility] OR;  Service: Orthopedics;  Laterality: Left;  Left Knee Hamstring Release and Open Reduction Internal Fixation Patella    History reviewed. No pertinent family history.  History  Substance Use Topics  . Smoking status: Never Smoker   . Smokeless tobacco: Never Used  . Alcohol Use: No    OB History    Grav Para Term Preterm Abortions TAB SAB Ect Mult Living                  Review of Systems  Constitutional: Negative for fever and chills.  HENT: Negative for facial swelling, neck pain and neck stiffness.   Eyes: Negative for visual disturbance.  Respiratory: Negative.   Cardiovascular: Negative.   Gastrointestinal: Negative.  Negative for nausea and vomiting.  Genitourinary: Negative.   Musculoskeletal: Negative.   Skin: Negative.   Neurological: Positive for speech change, focal weakness, speech difficulty and weakness. Negative for dizziness, seizures, loss of consciousness, numbness, headaches and paresthesias.  Psychiatric/Behavioral: Negative.  Negative for altered mental status.    Allergies  Penicillins  Home Medications   Current Outpatient Rx  Name Route Sig Dispense Refill  . LACRI-LUBE OP Both Eyes Place 1 drop into both eyes at bedtime.     Marland Kitchen CALCIUM CARBONATE-VITAMIN  D 500-200 MG-UNIT PO TABS Oral Take 1 tablet by mouth daily.      . CYCLOSPORINE 0.05 % OP EMUL Both Eyes Place 1 drop into both eyes 2 (two) times daily.      . DULOXETINE HCL 60 MG PO CPEP Oral Take 60 mg by mouth daily.      Marland Kitchen FLUTICASONE-SALMETEROL 100-50 MCG/DOSE IN AEPB Inhalation Inhale 1 puff into the lungs daily.     . FUROSEMIDE 20 MG PO TABS Oral Take 20 mg by mouth daily.      Marland Kitchen GABAPENTIN 400 MG PO CAPS Oral Take 400 mg by mouth at  bedtime.      . INSULIN ASPART 100 UNIT/ML [place] SOLN Subcutaneous Inject 10 Units into the skin 3 (three) times daily with meals.    . INSULIN GLARGINE 100 UNIT/ML [place] SOLN Subcutaneous Inject 15 Units into the skin at bedtime.    . INSULIN GLARGINE 100 UNIT/ML  SOLN Subcutaneous Inject 20 Units into the skin daily.    Marland Kitchen METOPROLOL SUCCINATE ER 25 MG PO TB24 Oral Take 25 mg by mouth daily.      Carma Leaven M PLUS PO TABS Oral Take 1 tablet by mouth daily.      Marland Kitchen OMEPRAZOLE 20 MG PO CPDR Oral Take 20 mg by mouth daily.      . OXYCODONE-ACETAMINOPHEN 5-325 MG PO TABS Oral Take 1 tablet by mouth every 6 (six) hours as needed. For pain 10 tablet 0  . POLYETHYL GLYCOL-PROPYL GLYCOL 0.4-0.3 % OP GEL Both Eyes Place 1 drop into both eyes 4 (four) times daily.      Marland Kitchen PREDNISONE 10 MG PO TABS Oral Take 10 mg by mouth 2 (two) times daily. For 2 days 2/28 and 3/1    . PRESCRIPTION MEDICATION Oral Take 5 mg by mouth every morning. Hydroxypropyl cellulose    . ROPINIROLE HCL 0.25 MG PO TABS Oral Take 0.25 mg by mouth 2 (two) times daily.      . SENNA 8.6 MG PO TABS Oral Take 1 tablet by mouth at bedtime.      Marland Kitchen SIMVASTATIN 20 MG PO TABS Oral Take 20 mg by mouth at bedtime.    . TRAZODONE HCL 50 MG PO TABS Oral Take 25 mg by mouth at bedtime as needed. For insomnia    . VITAMIN D (ERGOCALCIFEROL) 50000 UNITS PO CAPS Oral Take 50,000 Units by mouth every 7 (seven) days. mondays    . ALBUTEROL 90 MCG/ACT IN AERS Inhalation Inhale 2 puffs into the lungs every 6 (six) hours as needed for wheezing.    Marland Kitchen DICLOFENAC SODIUM 1 % TD GEL Topical Apply 1 application topically 4 (four) times daily as needed. For left knee pain       . POLYETHYLENE GLYCOL 3350 PO PACK Oral Take 17 g by mouth daily as needed. For constipation        BP 137/70  Pulse 67  Temp(Src) 97.7 F (36.5 C) (Oral)  Resp 19  SpO2 100%  Physical Exam  Nursing note and vitals reviewed. Constitutional: She is oriented to person, place, and time.  She appears well-developed and well-nourished. No distress.  HENT:  Head: Normocephalic and atraumatic.  Right Ear: External ear normal.  Left Ear: External ear normal.  Nose: Nose normal.  Mouth/Throat: Oropharynx is clear and moist.  Eyes: Conjunctivae and EOM are normal. Pupils are equal, round, and reactive to light.  Neck: Normal range of motion. Neck supple.  Cardiovascular: Normal rate,  regular rhythm and normal heart sounds.   Pulmonary/Chest: Effort normal and breath sounds normal. No respiratory distress.  Abdominal: Soft. She exhibits no distension.  Musculoskeletal: She exhibits no edema.  Neurological: She is alert and oriented to person, place, and time. She has normal strength. No cranial nerve deficit or sensory deficit. She displays a negative Romberg sign. GCS eye subscore is 4. GCS verbal subscore is 5. GCS motor subscore is 6.       Negative pronator drift, 5/5 and equal bilateral grip strength. Chronic LE weakness bilaterally.     ED Course  Procedures (including critical care time)   Pt with new dysphagia and dysphasia. Otherwise negative exam. Will get CT, labs.    Date: 03/22/2011  Rate: 66  Rhythm: normal sinus rhythm  QRS Axis: normal  Intervals: normal  ST/T Wave abnormalities: normal  Conduction Disutrbances:none  Narrative Interpretation:   Old EKG Reviewed: unchanged  Results for orders placed during the hospital encounter of 03/22/11  POCT I-STAT TROPONIN I      Component Value Range   Troponin i, poc 0.00  0.00 - 0.08 (ng/mL)   Comment 3            Dg Chest 1 View  03/22/2011  *RADIOLOGY REPORT*  Clinical Data: Short of breath  CHEST - 1 VIEW  Comparison: 02/15/2011  Findings: Mild cardiomegaly.  Normal vascularity.  Thorax is rotated to the right limiting the examination.  Lungs are grossly clear.  No pneumothorax.  IMPRESSION: Cardiomegaly without edema.  Original Report Authenticated By: Donavan Burnet, M.D.   Ct Head Wo Contrast  03/22/2011   *RADIOLOGY REPORT*  Clinical Data: Dysphagia.  CT HEAD WITHOUT CONTRAST  Technique:  Contiguous axial images were obtained from the base of the skull through the vertex without contrast.  Comparison: 02/15/2011  Findings: There is an infarct in the right basal ganglia, new since prior study. There is atrophy and chronic small vessel disease changes.  No hydrocephalus.  No hemorrhage.  Sinusitis changes in the maxillary sinuses, somewhat improved since prior study.  Mastoids are clear.  IMPRESSION: Likely subacute infarct in the right basal ganglia, new since prior study.  Atrophy, chronic small vessel disease.  Original Report Authenticated By: Cyndie Chime, M.D.   Subacute CVA on CT. Blood work still pending. Discussed with Dr. Juleen China who consulted neurology. Pt signed out at change of shift to Dr. Juleen China who will call for admission      MDM          Lottie Mussel, [place] 03/26/11 [phone]

## 2011-03-22 NOTE — ED Notes (Signed)
Pt in xray

## 2011-03-22 NOTE — ED Notes (Signed)
Report has been called to 3000 .  [phone]

## 2011-03-22 NOTE — Consult Note (Signed)
[facility] STROKE CONSULT NOTE       Chief Complaint: stroke   HPI:    Shelia Webb is an 78 y.o. female who resides at nursing home.  Although she was known to not be her baseline she was not brought to the hospital until today.  Due to her weakness on her left side not improving for one week she was brought to the ED. CT head shows right subacute BG infarct.  LSN: 1 week ago tPA Given: No: last seen normal 1 week ago by nursing home    Past Medical History  Diagnosis Date  . Hypertension   . Diabetes mellitus   . Dysrhythmia     a-fib  . Anemia   . Acute respiratory failure   . Chronic kidney disease     Kidney failure  . Allergic rhinitis   . Osteoarthrosis   . Osteoporosis   . Anxiety   . Depression   . Restless leg syndrome   . GERD (gastroesophageal reflux disease)   . Hypertonic bladder   . Peripheral vascular disease   . Coronary atherosclerosis due to lipid rich plaque   . DNR (do not resuscitate)     Past Surgical History  Procedure Date  . Colon surgery     has colostomy  . Abdominal hysterectomy   . Appendectomy   . Tonsillectomy   . Cardiac catheterization     bilateral cataract surgery  . Diaphragmatic hernia repair     with gangrene  . Orif patella 12/07/2010    Procedure: OPEN REDUCTION INTERNAL (ORIF) FIXATION PATELLA;  Surgeon: Nadara Mustard, MD;  Location: [facility] OR;  Service: Orthopedics;  Laterality: Left;  Left Knee Hamstring Release and Open Reduction Internal Fixation Patella  . Orif patella 12/21/2010    Procedure: OPEN REDUCTION INTERNAL (ORIF) FIXATION PATELLA;  Surgeon: Nadara Mustard, MD;  Location: [facility] OR;  Service: Orthopedics;  Laterality: Left;  Left Knee Hamstring Release and Open Reduction Internal Fixation Patella    History reviewed. No pertinent family history. Social History:  reports that she has never smoked. She has never used smokeless tobacco. She reports that she does not drink alcohol or use  illicit drugs.  Allergies:  Allergies  Allergen Reactions  . Penicillins Other (See Comments)    Redness and skin irritation    Medications:    Scheduled:    ROS: History obtained from the patient  General ROS: negative for - chills, fatigue, fever, night sweats, weight gain or weight loss Psychological ROS: negative for - behavioral disorder, hallucinations, memory difficulties, mood swings or suicidal ideation Ophthalmic ROS: negative for - blurry vision, double vision, eye pain or loss of vision ENT ROS: negative for - epistaxis, nasal discharge, oral lesions, sore throat, tinnitus or vertigo Allergy and Immunology ROS: negative for - hives or itchy/watery eyes Hematological and Lymphatic ROS: negative for - bleeding problems, bruising or swollen lymph nodes Endocrine ROS: negative for - galactorrhea, hair pattern changes, polydipsia/polyuria or temperature intolerance Respiratory ROS: negative for - cough, hemoptysis, shortness of breath or wheezing Cardiovascular ROS: negative for - chest pain, dyspnea on exertion, edema or irregular heartbeat Gastrointestinal ROS: negative for - abdominal pain, diarrhea, hematemesis, nausea/vomiting or stool incontinence Genito-Urinary ROS: negative for - dysuria, hematuria, incontinence or urinary frequency/urgency Musculoskeletal ROS: negative for - joint swelling or muscular weakness Neurological ROS: as noted in HPI Dermatological ROS: negative for rash and  skin lesion changes   Physical Examination: Blood pressure 121/61, pulse 68, temperature 97.2 F (36.2 C), temperature source Oral, resp. rate 16, SpO2 100.00%.  Neurologic Examination:   Mental Status: Alert, oriented to place, year, month.  Speech hypophonic fluent without evidence of aphasia. Able to follow simple commands without difficulty. Cranial Nerves: II-Visual fields grossly intact. III/IV/VI-Extraocular movements intact.  Pupils reactive bilaterally. V/VII-Smile  symmetric VIII-grossly intact IX/X-normal gag XI-bilateral shoulder shrug XII-midline tongue extension Motor: 5/5 bilaterally with normal tone and bulk except 5-/5 in proximal left arm. Left arm drift Sensory: Pinprick and light touch intact throughout, bilaterally Deep Tendon Reflexes: 2+ and symmetric throughout Plantars: Downgoing bilaterally Cerebellar: Normal finger-to-nose     Lab Results  Component Value Date/Time   CHOL 194 02/16/2011  5:00 AM   TSH 2.425 09/02/2009  6:00 AM   Dg Chest 1 View  03/22/2011  *RADIOLOGY REPORT*  Clinical Data: Short of breath  CHEST - 1 VIEW  Comparison: 02/15/2011  Findings: Mild cardiomegaly.  Normal vascularity.  Thorax is rotated to the right limiting the examination.  Lungs are grossly clear.  No pneumothorax.  IMPRESSION: Cardiomegaly without edema.  Original Report Authenticated By: Donavan Burnet, M.D.   Ct Head Wo Contrast  03/22/2011  *RADIOLOGY REPORT*  Clinical Data: Dysphagia.  CT HEAD WITHOUT CONTRAST  Technique:  Contiguous axial images were obtained from the base of the skull through the vertex without contrast.  Comparison: 02/15/2011  Findings: There is an infarct in the right basal ganglia, new since prior study. There is atrophy and chronic small vessel disease changes.  No hydrocephalus.  No hemorrhage.  Sinusitis changes in the maxillary sinuses, somewhat improved since prior study.  Mastoids are clear.  IMPRESSION: Likely subacute infarct in the right basal ganglia, new since prior study.  Atrophy, chronic small vessel disease.  Original Report Authenticated By: Cyndie Chime, M.D.    Assessment:    78 y.o. female with one week of left leg weakness. Nursing home noted the weakness was not improving and brought her to the ED.  She Has UTI and stroke seen on CT most likely subacute/ chronic.  Patients symptoms may be exacerbated by underlying UTI.    Stroke Risk Factors - atrial fibrillation, diabetes mellitus and  hypertension  Plan: 1. HgbA1c, fasting lipid panel 2. MRI, MRA  of the brain without contrast 3. PT consult, OT consult, Speech consult 4. Echocardiogram 5. Carotid dopplers 6. Prophylactic therapy-Antiplatelet med: Aspirin - dose 81 mg if no contraindications per medicine 7. Risk factor modification 8. Neuro check and vital q 4 hours 9. HOB 30 degrees 10. MAP 120-130 11. Cardiac monitoring    Felicie Morn PA-C [facility] [phone]  03/22/2011, 3:57 PM

## 2011-03-22 NOTE — ED Notes (Signed)
Patient transported to CT 

## 2011-03-22 NOTE — H&P (Signed)
PATIENT DETAILS Name: Shelia Webb Age: 78 y.o. Sex: female Date of Birth: 03-07-1921 Admit Date: 03/22/2011 [id number] Shelia Shropshire, MD, MD   CHIEF COMPLAINT:  Difficulty swallowing and speaking for the past 2 days  HPI: Patient is 78 year old Caucasian female with a past medical history of a recent TIA, history of atrial fibrillation not on chronic Coumadin therapy, hypertension, diabetes, bed to wheelchair bound brought in for evaluation of the above-noted complaints. In the ED patient underwent a CT scan of the head which showed a subacute infarct in the right basal ganglia area. She subsequently underwent a MRI of the brain which showed acute infarct in the posterior limb of the internal capsule on the right side. Patient currently is able to speak, speech is slow but mostly clear. She is now being admitted to the hospitalist service for further evaluation and treatment.   ALLERGIES:   Allergies  Allergen Reactions  . Penicillins Other (See Comments)    Redness and skin irritation    PAST MEDICAL HISTORY: Past Medical History  Diagnosis Date  . Hypertension   . Diabetes mellitus   . Dysrhythmia     a-fib  . Anemia   . Acute respiratory failure   . Chronic kidney disease     Kidney failure  . Allergic rhinitis   . Osteoarthrosis   . Osteoporosis   . Anxiety   . Depression   . Restless leg syndrome   . GERD (gastroesophageal reflux disease)   . Hypertonic bladder   . Peripheral vascular disease   . Coronary atherosclerosis due to lipid rich plaque   . DNR (do not resuscitate)     PAST SURGICAL HISTORY: Past Surgical History  Procedure Date  . Colon surgery     has colostomy  . Abdominal hysterectomy   . Appendectomy   . Tonsillectomy   . Cardiac catheterization     bilateral cataract surgery  . Diaphragmatic hernia repair     with gangrene  . Orif patella 12/07/2010    Procedure: OPEN REDUCTION INTERNAL (ORIF) FIXATION PATELLA;  Surgeon: Nadara Mustard,  MD;  Location: [facility] OR;  Service: Orthopedics;  Laterality: Left;  Left Knee Hamstring Release and Open Reduction Internal Fixation Patella  . Orif patella 12/21/2010    Procedure: OPEN REDUCTION INTERNAL (ORIF) FIXATION PATELLA;  Surgeon: Nadara Mustard, MD;  Location: [facility] OR;  Service: Orthopedics;  Laterality: Left;  Left Knee Hamstring Release and Open Reduction Internal Fixation Patella    MEDICATIONS AT HOME: Prior to Admission medications   Medication Sig Start Date End Date Taking? Authorizing Provider  Artificial Tear Ointment (LACRI-LUBE OP) Place 1 drop into both eyes at bedtime.    Yes Historical Provider, MD  calcium-vitamin D (OSCAL WITH D) 500-200 MG-UNIT per tablet Take 1 tablet by mouth daily.     Yes Historical Provider, MD  cycloSPORINE (RESTASIS) 0.05 % ophthalmic emulsion Place 1 drop into both eyes 2 (two) times daily.     Yes Historical Provider, MD  DULoxetine (CYMBALTA) 60 MG capsule Take 60 mg by mouth daily.     Yes Historical Provider, MD  Fluticasone-Salmeterol (ADVAIR) 100-50 MCG/DOSE AEPB Inhale 1 puff into the lungs daily.    Yes Historical Provider, MD  furosemide (LASIX) 20 MG tablet Take 20 mg by mouth daily.     Yes Historical Provider, MD  gabapentin (NEURONTIN) 400 MG capsule Take 400 mg by mouth at bedtime.     Yes Historical Provider, MD  insulin aspart (NOVOLOG) 100  UNIT/ML injection Inject 10 Units into the skin 3 (three) times daily with meals. 02/18/11 02/18/12 Yes Marcellus Scott, MD  insulin glargine (LANTUS) 100 UNIT/ML injection Inject 15 Units into the skin at bedtime. 02/18/11 02/18/12 Yes Marcellus Scott, MD  insulin glargine (LANTUS) 100 UNIT/ML injection Inject 20 Units into the skin daily. 02/18/11 02/18/12 Yes Marcellus Scott, MD  metoprolol succinate (TOPROL-XL) 25 MG 24 hr tablet Take 25 mg by mouth daily.     Yes Historical Provider, MD  Multiple Vitamins-Minerals (MULTIVITAMINS THER. W/MINERALS) TABS Take 1 tablet by mouth daily.     Yes Historical  Provider, MD  omeprazole (PRILOSEC) 20 MG capsule Take 20 mg by mouth daily.     Yes Historical Provider, MD  oxyCODONE-acetaminophen (PERCOCET) 5-325 MG per tablet Take 1 tablet by mouth every 6 (six) hours as needed. For pain 02/18/11  Yes Marcellus Scott, MD  Polyethyl Glycol-Propyl Glycol (SYSTANE) 0.4-0.3 % GEL Place 1 drop into both eyes 4 (four) times daily.     Yes Historical Provider, MD  predniSONE (DELTASONE) 10 MG tablet Take 10 mg by mouth 2 (two) times daily. For 2 days 2/28 and 3/1   Yes Historical Provider, MD  PRESCRIPTION MEDICATION Take 5 mg by mouth every morning. Hydroxypropyl cellulose   Yes Historical Provider, MD  rOPINIRole (REQUIP) 0.25 MG tablet Take 0.25 mg by mouth 2 (two) times daily.     Yes Historical Provider, MD  senna (SENOKOT) 8.6 MG TABS Take 1 tablet by mouth at bedtime.     Yes Historical Provider, MD  simvastatin (ZOCOR) 20 MG tablet Take 20 mg by mouth at bedtime. 02/18/11 02/18/12 Yes Marcellus Scott, MD  traZODone (DESYREL) 50 MG tablet Take 25 mg by mouth at bedtime as needed. For insomnia   Yes Historical Provider, MD  Vitamin D, Ergocalciferol, (DRISDOL) 50000 UNITS CAPS Take 50,000 Units by mouth every 7 (seven) days. mondays   Yes Historical Provider, MD  albuterol (PROVENTIL,VENTOLIN) 90 MCG/ACT inhaler Inhale 2 puffs into the lungs every 6 (six) hours as needed for wheezing. 02/18/11 02/13/12  Marcellus Scott, MD  diclofenac sodium (VOLTAREN) 1 % GEL Apply 1 application topically 4 (four) times daily as needed. For left knee pain       Historical Provider, MD  polyethylene glycol (MIRALAX / GLYCOLAX) packet Take 17 g by mouth daily as needed. For constipation      Historical Provider, MD    FAMILY HISTORY: History reviewed. No pertinent family history.  SOCIAL HISTORY:  reports that she has never smoked. She has never used smokeless tobacco. She reports that she does not drink alcohol or use illicit drugs.  REVIEW OF SYSTEMS:  Constitutional:   No   weight loss, night sweats,  Fevers, chills, fatigue.  HEENT:    No headaches, Difficulty swallowing,Tooth/dental problems,Sore throat,  No sneezing, itching, ear ache, nasal congestion, post nasal drip,   Cardio-vascular: No chest pain,  Orthopnea, PND, swelling in lower extremities, anasarca, dizziness, palpitations  GI:  No heartburn, indigestion, abdominal pain, nausea, vomiting, diarrhea, change in bowel habits, loss of appetite  Resp: No shortness of breath with exertion or at rest.  No excess mucus, no productive cough, No non-productive cough,  No coughing up of blood.No change in color of mucus.No wheezing.No chest wall deformity  Skin:  no rash or lesions.  GU:  no dysuria, change in color of urine, no urgency or frequency.  No flank pain.  Musculoskeletal: No joint pain or swelling.  No decreased range  of motion.  No back pain.  Psych: No change in mood or affect. No depression or anxiety.  No memory loss.   PHYSICAL EXAM: Blood pressure 136/71, pulse 68, temperature 97.2 F (36.2 C), temperature source Oral, resp. rate 16, height 5' (1.524 m), weight 71.201 kg (156 lb 15.5 oz), SpO2 99.00%.  General appearance :Awake, alert, not in any distress. Speech slow but Clear. Not toxic Looking HEENT: Atraumatic and Normocephalic, pupils equally reactive to light and accomodation Neck: supple, no JVD. No cervical lymphadenopathy.  Chest:Good air entry bilaterally, no added sounds  CVS: S1 S2 regular, no murmurs.  Abdomen: Bowel sounds present, Non tender and not distended with no gaurding, rigidity or rebound. Extremities: B/L Lower Ext shows no edema, both legs are warm to touch, with  dorsalis pedis pulses palpable. Neurology slight left facial droop, minimal weakness in the left upper extremity with drift, otherwise nonfocal  Skin:No Rash Wounds:N/A  LABS ON ADMISSION:   Basename 03/22/11 1601  NA 135  K 3.7  CL 98  CO2 26  GLUCOSE 203*  BUN 23  CREATININE  0.69  CALCIUM 9.6  MG --  PHOS --    Basename 03/22/11 1601  AST 19  ALT 22  ALKPHOS 106  BILITOT 0.3  PROT 7.6  ALBUMIN 3.5   No results found for this basename: LIPASE:2,AMYLASE:2 in the last 72 hours  Basename 03/22/11 1601  WBC 12.4*  NEUTROABS 7.9*  HGB 14.7  HCT 45.8  MCV 80.2  PLT 216   No results found for this basename: CKTOTAL:3,CKMB:3,CKMBINDEX:3,TROPONINI:3 in the last 72 hours No results found for this basename: DDIMER:2 in the last 72 hours No components found with this basename: POCBNP:3   RADIOLOGIC STUDIES ON ADMISSION: Dg Chest 1 View  03/22/2011  *RADIOLOGY REPORT*  Clinical Data: Short of breath  CHEST - 1 VIEW  Comparison: 02/15/2011  Findings: Mild cardiomegaly.  Normal vascularity.  Thorax is rotated to the right limiting the examination.  Lungs are grossly clear.  No pneumothorax.  IMPRESSION: Cardiomegaly without edema.  Original Report Authenticated By: Donavan Burnet, M.D.   Ct Head Wo Contrast  03/22/2011  *RADIOLOGY REPORT*  Clinical Data: Dysphagia.  CT HEAD WITHOUT CONTRAST  Technique:  Contiguous axial images were obtained from the base of the skull through the vertex without contrast.  Comparison: 02/15/2011  Findings: There is an infarct in the right basal ganglia, new since prior study. There is atrophy and chronic small vessel disease changes.  No hydrocephalus.  No hemorrhage.  Sinusitis changes in the maxillary sinuses, somewhat improved since prior study.  Mastoids are clear.  IMPRESSION: Likely subacute infarct in the right basal ganglia, new since prior study.  Atrophy, chronic small vessel disease.  Original Report Authenticated By: Cyndie Chime, M.D.   Mr Maxine Glenn Head Wo Contrast  03/22/2011  *RADIOLOGY REPORT*  Clinical Data:  Stroke.  Dysphagia and slurred speech.  MRI HEAD WITHOUT CONTRAST MRA HEAD WITHOUT CONTRAST  Technique:  Multiplanar, multiecho pulse sequences of the brain and surrounding structures were obtained without intravenous  contrast. Angiographic images of the head were obtained using MRA technique without contrast.  Comparison:  CT 03/22/2011  MRI HEAD  Findings:  Acute infarct posterior limb internal capsule on the right.  No other acute infarcts.  Generalized atrophy mild chronic microvascular ischemia in the white matter. Chronic ischemic changes in the thalami bilaterally. The brainstem is intact.  Negative for hemorrhage or mass lesion.  Chronic sinusitis with mucosal edema in  the maxillary sinus bilaterally.  IMPRESSION: Acute infarct posterior limb internal capsule on the right.  Atrophy and mild chronic microvascular ischemia.  MRA HEAD  Findings: Image quality degraded by artifact.  Tortuosity of the distal vertebral arteries and basilar causes decreased signal.  There is decreased signal in the proximal basilar making it difficult to evaluate.  The vertebral arteries and basilar are patent.  Superior cerebellar and posterior cerebral arteries are patent bilaterally.  Internal carotid artery is patent bilaterally without stenosis. Anterior and middle cerebral arteries are patent bilaterally without stenosis.  Negative for cerebral aneurysm.  IMPRESSION: Distal vertebral arteries and proximal basilar are not adequately evaluated due to artifact.  Otherwise negative.  Original Report Authenticated By: Camelia Phenes, M.D.   Mr Brain Wo Contrast  03/22/2011  *RADIOLOGY REPORT*  Clinical Data:  Stroke.  Dysphagia and slurred speech.  MRI HEAD WITHOUT CONTRAST MRA HEAD WITHOUT CONTRAST  Technique:  Multiplanar, multiecho pulse sequences of the brain and surrounding structures were obtained without intravenous contrast. Angiographic images of the head were obtained using MRA technique without contrast.  Comparison:  CT 03/22/2011  MRI HEAD  Findings:  Acute infarct posterior limb internal capsule on the right.  No other acute infarcts.  Generalized atrophy mild chronic microvascular ischemia in the white matter. Chronic ischemic  changes in the thalami bilaterally. The brainstem is intact.  Negative for hemorrhage or mass lesion.  Chronic sinusitis with mucosal edema in the maxillary sinus bilaterally.  IMPRESSION: Acute infarct posterior limb internal capsule on the right.  Atrophy and mild chronic microvascular ischemia.  MRA HEAD  Findings: Image quality degraded by artifact.  Tortuosity of the distal vertebral arteries and basilar causes decreased signal.  There is decreased signal in the proximal basilar making it difficult to evaluate.  The vertebral arteries and basilar are patent.  Superior cerebellar and posterior cerebral arteries are patent bilaterally.  Internal carotid artery is patent bilaterally without stenosis. Anterior and middle cerebral arteries are patent bilaterally without stenosis.  Negative for cerebral aneurysm.  IMPRESSION: Distal vertebral arteries and proximal basilar are not adequately evaluated due to artifact.  Otherwise negative.  Original Report Authenticated By: Camelia Phenes, M.D.    ASSESSMENT AND PLAN: Present on Admission:  .CVA (cerebral infarction) -Acute/subacute  -Has history of chronic atrial fibrillation, with a CHADS2 score of at least 5, ideally would need anticoagulation, however given age, frailty and a decreased risk of fall will need to do risk vs. benefit discussion with patient and her son before initiating Coumadin therapy. However currently needs a swallow evaluation first before even oral intake can be resumed.  -For now we'll place on aspirin , and continue statin.  -Await further recommendations from neurology   .Dysphagia -RN to perform a bedside swallow screen, if needed we will order a formal swallow evaluation by speech therapy   .CAD -Continue with antiplatelet agents   .DM -Place on sliding scale regimen, when oral intake has been resumed and she can be placed back on her usual dosing of Lantus and NovoLog   .HYPERTENSION -Currently BP controlled without  the use of any antihypertensive medications, for now allow for some permissive hypertension, and so the restart antihypertensive medications over the next few days   Further plan will depend as patient's clinical course evolves and further radiologic and laboratory data become available. Patient will be monitored closely.   DVT Prophylaxis: Prophylactic Lovenox  Code Status: DO NOT RESUSCITATE, this was confirmed with the patient and the  patient's son over the phone  Total time spent for admission equals 45 minutes.  Jeoffrey Massed 03/22/2011, 8:56 PM

## 2011-03-22 NOTE — ED Notes (Signed)
Pt here for visual disturbances, problems swallowing and speaking onset yesterday. Pt is nonmobile at facility. Pt does have grip strength equal.

## 2011-03-22 NOTE — ED Notes (Signed)
The pt ,has been  In echo for the past hour

## 2011-03-22 NOTE — ED Provider Notes (Addendum)
Medical screening examination/treatment/procedure(s) were conducted as a shared visit with non-physician practitioner(s) and myself.  I personally evaluated the patient during the encounter.  78 year old female with dysarthria and dysphagia. Symptom onset was yesterday. Patient was recently admitted about a month ago with left-sided weakness. She had an MRI during that hospitalization which did not show any acute findings. Apparently the decision for anticoagulation was deferred to her primary care doctor. Per review of her current med list, she is not on any blood thinning medication despite multiple risk factors. CT scan today shows a subacute right basal ganglia infarct. This clinically correlates of patient's symptoms. Will discuss with neurology. Admission.  3:54 PM Case was discussed with neurology. Will see patient in emergency room in consult.  6:48 PM Case was discussed with hospitalist for admission.  Dg Chest 1 View  03/22/2011  *RADIOLOGY REPORT*  Clinical Data: Short of breath  CHEST - 1 VIEW  Comparison: 02/15/2011  Findings: Mild cardiomegaly.  Normal vascularity.  Thorax is rotated to the right limiting the examination.  Lungs are grossly clear.  No pneumothorax.  IMPRESSION: Cardiomegaly without edema.  Original Report Authenticated By: Donavan Burnet, M.D.   Ct Head Wo Contrast  03/22/2011  *RADIOLOGY REPORT*  Clinical Data: Dysphagia.  CT HEAD WITHOUT CONTRAST  Technique:  Contiguous axial images were obtained from the base of the skull through the vertex without contrast.  Comparison: 02/15/2011  Findings: There is an infarct in the right basal ganglia, new since prior study. There is atrophy and chronic small vessel disease changes.  No hydrocephalus.  No hemorrhage.  Sinusitis changes in the maxillary sinuses, somewhat improved since prior study.  Mastoids are clear.  IMPRESSION: Likely subacute infarct in the right basal ganglia, new since prior study.  Atrophy, chronic small  vessel disease.  Original Report Authenticated By: Cyndie Chime, M.D.    Raeford Razor, MD 03/22/11 [place]  Raeford Razor, MD 04/01/11 [place] Moment

## 2011-03-23 LAB — GLUCOSE, CAPILLARY
Glucose-Capillary: 124 mg/dL — ABNORMAL HIGH (ref 70–99)
Glucose-Capillary: 119 mg/dL — ABNORMAL HIGH (ref 70–99)
Glucose-Capillary: 209 mg/dL — ABNORMAL HIGH (ref 70–99)
Glucose-Capillary: 226 mg/dL — ABNORMAL HIGH (ref 70–99)

## 2011-03-23 LAB — LIPID PANEL
Cholesterol: 137 mg/dL (ref 0–200)
LDL Cholesterol: 47 mg/dL (ref 0–99)
LDL Cholesterol: 54 mg/dL (ref 0–99)
Total CHOL/HDL Ratio: 2.3 RATIO
VLDL: 24 mg/dL (ref 0–40)

## 2011-03-23 LAB — HEMOGLOBIN A1C: Hgb A1c MFr Bld: 11.3 % — ABNORMAL HIGH (ref <5.7)

## 2011-03-23 MED ORDER — INSULIN GLARGINE 100 UNIT/ML ~~LOC~~ SOLN
15.0000 [IU] | Freq: Every day | SUBCUTANEOUS | Status: DC
  Administered 2011-03-23: 15 [IU] via SUBCUTANEOUS
  Filled 2011-03-23: qty 3

## 2011-03-23 NOTE — Progress Notes (Signed)
Subjective: Patient relates painful right leg  Objective: Filed Vitals:   03/23/11 0751 03/23/11 0924 03/23/11 1000 03/23/11 1418  BP: 127/76  110/74 143/85  Pulse: 74  70 87  Temp: 97.9 F (36.6 C)  97.3 F (36.3 C) 98.3 F (36.8 C)  TempSrc: Oral  Oral Oral  Resp: 20  20 20   Height:      Weight:      SpO2: 95% 98% 95% 95%   Weight change:   Intake/Output Summary (Last 24 hours) at 03/23/11 1514 Last data filed at 03/23/11 1445  Gross per 24 hour  Intake 764.17 ml  Output      0 ml  Net 764.17 ml    General: Alert, awake, oriented x2, in no acute distress.  HEENT: No bruits, no goiter.  Heart: Regular rate and rhythm, without murmurs, rubs, gallops.  Lungs: Crackles left side, bilateral air movement.  Abdomen: Soft, nontender, nondistended, positive bowel sounds.  Neuro: Grossly intact, nonfocal.   Lab Results:  [facility] 03/22/11 2059 03/22/11 1601  NA -- 135  K -- 3.7  CL -- 98  CO2 -- 26  GLUCOSE -- 203*  BUN -- 23  CREATININE 0.66 0.69  CALCIUM -- 9.6  MG -- --  PHOS -- --    Basename 03/22/11 1601  AST 19  ALT 22  ALKPHOS 106  BILITOT 0.3  PROT 7.6  ALBUMIN 3.5   No results found for this basename: LIPASE:2,AMYLASE:2 in the last 72 hours  Basename 03/22/11 2059 03/22/11 1601  WBC 12.2* 12.4*  NEUTROABS -- 7.9*  HGB 14.8 14.7  HCT 43.9 45.8  MCV 78.7 80.2  PLT 177 216   No results found for this basename: CKTOTAL:3,CKMB:3,CKMBINDEX:3,TROPONINI:3 in the last 72 hours No components found with this basename: POCBNP:3 No results found for this basename: DDIMER:2 in the last 72 hours  Basename 03/22/11 2346 03/22/11 1613  HGBA1C 11.3* 11.0*    Basename 03/23/11 0700 03/22/11 2346  CHOL 140 137  HDL 51 59  LDLCALC 47 54  TRIG 209* 118  CHOLHDL 2.7 2.3  LDLDIRECT -- --   No results found for this basename: TSH,T4TOTAL,FREET3,T3FREE,THYROIDAB in the last 72 hours No results found for this basename:  VITAMINB12:2,FOLATE:2,FERRITIN:2,TIBC:2,IRON:2,RETICCTPCT:2 in the last 72 hours  Micro Results: Recent Results (from the past 240 hour(s))  MRSA PCR SCREENING     Status: Normal   Collection Time   03/22/11 11:45 PM      Component Value Range Status Comment   MRSA by PCR NEGATIVE  NEGATIVE  Final     Studies/Results: Dg Chest 1 View  03/22/2011  *RADIOLOGY REPORT*  Clinical Data: Short of breath  CHEST - 1 VIEW  Comparison: 02/15/2011  Findings: Mild cardiomegaly.  Normal vascularity.  Thorax is rotated to the right limiting the examination.  Lungs are grossly clear.  No pneumothorax.  IMPRESSION: Cardiomegaly without edema.  Original Report Authenticated By: Donavan Burnet, M.D.   Ct Head Wo Contrast  03/22/2011  *RADIOLOGY REPORT*  Clinical Data: Dysphagia.  CT HEAD WITHOUT CONTRAST  Technique:  Contiguous axial images were obtained from the base of the skull through the vertex without contrast.  Comparison: 02/15/2011  Findings: There is an infarct in the right basal ganglia, new since prior study. There is atrophy and chronic small vessel disease changes.  No hydrocephalus.  No hemorrhage.  Sinusitis changes in the maxillary sinuses, somewhat improved since prior study.  Mastoids are clear.  IMPRESSION: Likely subacute infarct in the right  basal ganglia, new since prior study.  Atrophy, chronic small vessel disease.  Original Report Authenticated By: Cyndie Chime, M.D.   Mr Maxine Glenn Head Wo Contrast  03/22/2011  *RADIOLOGY REPORT*  Clinical Data:  Stroke.  Dysphagia and slurred speech.  MRI HEAD WITHOUT CONTRAST MRA HEAD WITHOUT CONTRAST  Technique:  Multiplanar, multiecho pulse sequences of the brain and surrounding structures were obtained without intravenous contrast. Angiographic images of the head were obtained using MRA technique without contrast.  Comparison:  CT 03/22/2011  MRI HEAD  Findings:  Acute infarct posterior limb internal capsule on the right.  No other acute infarcts.  Generalized  atrophy mild chronic microvascular ischemia in the white matter. Chronic ischemic changes in the thalami bilaterally. The brainstem is intact.  Negative for hemorrhage or mass lesion.  Chronic sinusitis with mucosal edema in the maxillary sinus bilaterally.  IMPRESSION: Acute infarct posterior limb internal capsule on the right.  Atrophy and mild chronic microvascular ischemia.  MRA HEAD  Findings: Image quality degraded by artifact.  Tortuosity of the distal vertebral arteries and basilar causes decreased signal.  There is decreased signal in the proximal basilar making it difficult to evaluate.  The vertebral arteries and basilar are patent.  Superior cerebellar and posterior cerebral arteries are patent bilaterally.  Internal carotid artery is patent bilaterally without stenosis. Anterior and middle cerebral arteries are patent bilaterally without stenosis.  Negative for cerebral aneurysm.  IMPRESSION: Distal vertebral arteries and proximal basilar are not adequately evaluated due to artifact.  Otherwise negative.  Original Report Authenticated By: Camelia Phenes, M.D.   Mr Brain Wo Contrast  03/22/2011  *RADIOLOGY REPORT*  Clinical Data:  Stroke.  Dysphagia and slurred speech.  MRI HEAD WITHOUT CONTRAST MRA HEAD WITHOUT CONTRAST  Technique:  Multiplanar, multiecho pulse sequences of the brain and surrounding structures were obtained without intravenous contrast. Angiographic images of the head were obtained using MRA technique without contrast.  Comparison:  CT 03/22/2011  MRI HEAD  Findings:  Acute infarct posterior limb internal capsule on the right.  No other acute infarcts.  Generalized atrophy mild chronic microvascular ischemia in the white matter. Chronic ischemic changes in the thalami bilaterally. The brainstem is intact.  Negative for hemorrhage or mass lesion.  Chronic sinusitis with mucosal edema in the maxillary sinus bilaterally.  IMPRESSION: Acute infarct posterior limb internal capsule on the  right.  Atrophy and mild chronic microvascular ischemia.  MRA HEAD  Findings: Image quality degraded by artifact.  Tortuosity of the distal vertebral arteries and basilar causes decreased signal.  There is decreased signal in the proximal basilar making it difficult to evaluate.  The vertebral arteries and basilar are patent.  Superior cerebellar and posterior cerebral arteries are patent bilaterally.  Internal carotid artery is patent bilaterally without stenosis. Anterior and middle cerebral arteries are patent bilaterally without stenosis.  Negative for cerebral aneurysm.  IMPRESSION: Distal vertebral arteries and proximal basilar are not adequately evaluated due to artifact.  Otherwise negative.  Original Report Authenticated By: Camelia Phenes, M.D.    Medications: I have reviewed the patient's current medications.  Assessment and plan: Principal Problem:  *CVA (cerebral infarction) Right posterior Limb internal capsule: -On plavix secondary stroke prevention. Will have to discuss with family why anticoagulation for a fib was stop. Risk and benefits. -Patient seems to be a poor candidate for anticoagulation due to her fragility and increase risk of fall. CHADS 5. -statin. -carotid doppler pending.  -UTI: -continue rocephin, UC pending.  2. DM -  start home dose lantus as sliding scale continues to increase.  3.HYPERTENSION  4.CAD: contineu plavix.  5. Dysphagia Dysphagia 1 diet. Passed swallowing evaluation.      LOS: 1 day   Marinda Elk M.D. Pager: [phone] [facility] 03/23/2011, 3:14 PM

## 2011-03-23 NOTE — Progress Notes (Signed)
VASCULAR LAB PRELIMINARY  PRELIMINARY  PRELIMINARY  PRELIMINARY  Patient had Carotid Dopplers 02/16/11, showing no ICA stenosis.  The vascular lab generally does not repeat normal studies within a 6 month period.  Please advise if you would like the study repeated.       Sherren Kerns [place], 03/23/2011, 11:32 AM

## 2011-03-23 NOTE — Evaluation (Signed)
Physical Therapy Evaluation Patient Details Name: Shelia Webb MRN: [id number] DOB: 07-Aug-1921 Today's Date: 03/23/2011  Problem List:  Patient Active Problem List  Diagnoses  . COLONIC POLYPS  . DM  . RESTLESS LEG SYNDROME  . DRY EYE SYNDROME  . HYPERTENSION  . CAD  . PERIPHERAL VASCULAR DISEASE  . VENOUS STASIS ULCER  . HEMORRHOIDS, INTERNAL  . ESOPHAGEAL STRICTURE  . GERD  . DIVERTICULOSIS, COLON  . OVERACTIVE BLADDER  . OSTEOARTHRITIS  . UNSTEADY GAIT  . Ventral hernia without mention of obstruction or gangrene  . TIA (transient ischemic attack)  . CVA (cerebral infarction)  . Dysphagia    Past Medical History:  Past Medical History  Diagnosis Date  . Hypertension   . Diabetes mellitus   . Dysrhythmia     a-fib  . Anemia   . Acute respiratory failure   . Chronic kidney disease     Kidney failure  . Allergic rhinitis   . Osteoarthrosis   . Osteoporosis   . Anxiety   . Depression   . Restless leg syndrome   . GERD (gastroesophageal reflux disease)   . Hypertonic bladder   . Peripheral vascular disease   . Coronary atherosclerosis due to lipid rich plaque   . DNR (do not resuscitate)    Past Surgical History:  Past Surgical History  Procedure Date  . Colon surgery     has colostomy  . Abdominal hysterectomy   . Appendectomy   . Tonsillectomy   . Cardiac catheterization     bilateral cataract surgery  . Diaphragmatic hernia repair     with gangrene  . Orif patella 12/07/2010    Procedure: OPEN REDUCTION INTERNAL (ORIF) FIXATION PATELLA;  Surgeon: Nadara Mustard, MD;  Location: [facility] OR;  Service: Orthopedics;  Laterality: Left;  Left Knee Hamstring Release and Open Reduction Internal Fixation Patella  . Orif patella 12/21/2010    Procedure: OPEN REDUCTION INTERNAL (ORIF) FIXATION PATELLA;  Surgeon: Nadara Mustard, MD;  Location: [facility] OR;  Service: Orthopedics;  Laterality: Left;  Left Knee Hamstring Release and Open Reduction Internal Fixation Patella     PT Assessment/Plan/Recommendation PT Assessment Clinical Impression Statement: Patient is 78 year old Caucasian female with a past medical history of a recent TIA, history of atrial fibrillation not on chronic Coumadin therapy, hypertension, diabetes, bed to wheelchair bound brought in for evaluation of the above-noted complaints. In the ED patient underwent a CT scan of the head which showed a subacute infarct in the right basal ganglia area. She subsequently underwent a MRI of the brain which showed acute infarct in the posterior limb of the internal capsule on the right side. Patient currently is able to speak, speech is slow but mostly clear. She is now being admitted to the hospitalist service for further evaluation and treatment. Presents to PT with decreased strength bilateral upper extremities (change from baseline) affecting her ability to be independent when propelling her w/c. Will trial this pt for PT to address these functional changes. Rec return to SNF for continued therapies.  PT Recommendation/Assessment: Patient will need skilled PT in the acute care venue PT Problem List: Decreased strength;Decreased mobility PT Therapy Diagnosis : Generalized weakness PT Plan PT Frequency: Min 2X/week PT Treatment/Interventions: Functional mobility training;Wheelchair mobility training;Therapeutic exercise;Therapeutic activities PT Recommendation Recommendations for Other Services: OT consult Follow Up Recommendations: Skilled nursing facility Equipment Recommended: Defer to next venue PT Goals  Acute Rehab PT Goals PT Goal Formulation: With patient Time For Goal Achievement: 2  weeks Pt will Roll Supine to Right Side: with min assist;with rail PT Goal: Rolling Supine to Right Side - Progress: Goal set today Pt will Perform Home Exercise Program: Independently PT Goal: Perform Home Exercise Program - Progress: Goal set today Pt will Propel Wheelchair: 10 - 50 feet;with supervision (using  lift to get pt to w/c) PT Goal: Propel Wheelchair - Progress: Goal set today  PT Evaluation Precautions/Restrictions  Precautions Precautions: Fall Precaution Comments: NH uses lift to w/c secondary to bil LE contractures Restrictions Weight Bearing Restrictions: No Prior Functioning  Home Living Type of Home: Skilled Nursing Facility Home Adaptive Equipment: Wheelchair - manual (hospital bed at [facility]) Prior [employer] [facility]: Total Toileting: Total (diapers) Dressing: Supervision/set-up Grooming: Supervision/set-up Feeding: Supervision/set-up Meal Prep: Total Driving: No Comments: reports she was able to roll right independently in the hospital bed but needed some assist to roll left; NH lifts her to w/c and she was able to propell her w/c short distances in her room  Cognition Cognition Arousal/Alertness: Awake/alert Overall Cognitive Status: Appears within functional limits for tasks assessed Orientation Level: Oriented X4 Cognition - Other Comments: almost masked face with glimmers of smile, very pleasant Sensation/Coordination Sensation Light Touch: Impaired by gross assessment Coordination Gross Motor Movements are Fluid and Coordinated: Yes Fine Motor Movements are Fluid and Coordinated: No Coordination and Movement Description: slow UE movement  Extremity Assessment RUE Strength RUE Overall Strength: Deficits RUE Overall Strength Comments: grossly 3/5 strength throughout  LUE AROM (degrees) LUE Overall AROM Comments: shoulder AROM limited to grossly 90 degrees (likely premorbid status)  LUE Strength LUE Overall Strength Comments: grossly 3-3-/5 strength throughout RLE PROM (degrees) Overall PROM Right Lower Extremity: Deficits;Due to premorbid status RLE Overall PROM Comments: right knee flexion contracture, increased tone; limited AROM to about 5-10 degrees of flex/ext  LLE Strength LLE Overall Strength: Deficits;Due to premorbid status LLE Overall Strength Comments:  knee visibly deformed from prior injury. knee flexed Mobility (including Balance) Bed Mobility Rolling Right: 2: Max assist Rolling Right Details (indicate cue type and reason): maxA to initiate rolling and pt able to then reach for rail with LUE and pull herself all the way over; pt remained on her side with minA during pericare as pt incontinent with urine; pt not showing segmental trunk rotation, relatively rigid Rolling Left: 1: +2 Total assist (10%) Rolling Left Details (indicate cue type and reason): use of pad to assist with rolling Transfers Transfers: No Ambulation/Gait Ambulation/Gait: No Wheelchair Mobility Wheelchair Mobility: No  Balance Balance Assessed: No Exercise  General Exercises - Upper Extremity Shoulder Flexion: AAROM;Both;10 reps;Supine End of Session PT - End of Session Activity Tolerance: Patient tolerated treatment well Patient left: in bed;with call bell in reach;with bed alarm set Nurse Communication: Mobility status for transfers General Behavior During Session: [facility] for tasks performed (very pleasant) Cognition: [facility] for tasks performed  [facility] HELEN 03/23/2011, 3:29 PM

## 2011-03-23 NOTE — Evaluation (Signed)
Clinical/Bedside Swallow Evaluation Patient Details  Name: Shelia Webb MRN: [id number] DOB: 06/27/1921 Today's Date: 03/23/2011  Past Medical History:  Past Medical History  Diagnosis Date  . Hypertension   . Diabetes mellitus   . Dysrhythmia     a-fib  . Anemia   . Acute respiratory failure   . Chronic kidney disease     Kidney failure  . Allergic rhinitis   . Osteoarthrosis   . Osteoporosis   . Anxiety   . Depression   . Restless leg syndrome   . GERD (gastroesophageal reflux disease)   . Hypertonic bladder   . Peripheral vascular disease   . Coronary atherosclerosis due to lipid rich plaque   . DNR (do not resuscitate)    Past Surgical History:  Past Surgical History  Procedure Date  . Colon surgery     has colostomy  . Abdominal hysterectomy   . Appendectomy   . Tonsillectomy   . Cardiac catheterization     bilateral cataract surgery  . Diaphragmatic hernia repair     with gangrene  . Orif patella 12/07/2010    Procedure: OPEN REDUCTION INTERNAL (ORIF) FIXATION PATELLA;  Surgeon: Nadara Mustard, MD;  Location: [facility] OR;  Service: Orthopedics;  Laterality: Left;  Left Knee Hamstring Release and Open Reduction Internal Fixation Patella  . Orif patella 12/21/2010    Procedure: OPEN REDUCTION INTERNAL (ORIF) FIXATION PATELLA;  Surgeon: Nadara Mustard, MD;  Location: [facility] OR;  Service: Orthopedics;  Laterality: Left;  Left Knee Hamstring Release and Open Reduction Internal Fixation Patella   HPI:  78 y/o female admitted to ED from SNF with difficulty swallowing and speaking for past 2 days.  CT scan completed in ED indicates subacute infarct in the right basal ganglia.   PMH significant for recent TIA, GERD, Acute respiratory failure, diaphragmatic hernia repair, and dysphagia.  Patient with 3 prior MBS: April 2010, September 2010, and April of 2011.  Results  indicate patient presents with primarily anatomically based pharyngeal phase dysphagia secondary to questionable C2 and  C3 -5 impact into posterior pharyngeal wall contributing to a narrow pharyngeal space.  Diet recommendations from most currrent study  was dysphagia 3 with thin liquids.     Assessment/Recommendations/Treatment Plan    SLP Assessment Clinical Impression Statement: Moderate to severe oropharyngeal dysphagia marked my weakness, delay in initiation, and decreased laryngeal closure resulting in +s/s of apsiration s/p swallow with trial ice chips and all liquid presentations.  Patient with ineffective throat clears s/p swallow of all liquids indicating possible residuals  putting patient at risk for aspiration after the swallow.  Recommend to proceed with dysphagia 1 diet consistency only as patient observed to tolerate puree consistency in small amounts .  Secondary to history of dysphagia and current episode recommend to proceed with objective evaluation of MBS to assess risk for aspiration and recommend safest, possible diet consistency.  MBS to be completed on 03-24-11. Risk for Aspiration: Severe Other Related Risk Factors: History of dysphagia;History of GERD;Lethargy  Swallow Evaluation Recommendations Recommended Consults: MBS Solid Consistency: Dysphagia 1 (Puree) Liquid Consistency: None Medication Administration: Crushed with puree Supervision: Full supervision/cueing for compensatory strategies Compensations: Slow rate;Small sips/bites;Clear throat intermittently Postural Changes and/or Swallow Maneuvers: Seated upright 90 degrees;Upright 30-60 min after meal Oral Care Recommendations: Oral care before and after PO Other Recommendations: Clarify dietary restrictions Follow up Recommendations: Skilled Nursing facility  Treatment Plan Speech Therapy Frequency: min 2x/week Treatment Duration: 2 weeks Interventions: Aspiration precaution training;Trials of upgraded  texture/liquids;Diet toleration management by SLP;Compensatory techniques;Patient/family education;Other (Comment) (pending  results of MBS)  Prognosis Prognosis for Safe Diet Advancement: Good  Individuals Consulted Consulted and Agree with Results and Recommendations: Patient;RN  Swallowing Goals  SLP Swallowing Goals Patient will consume recommended diet without observed clinical signs of aspiration with: Minimal assistance Patient will utilize recommended strategies during swallow to increase swallowing safety with: Minimal assistance     General  Date of Onset: 03/22/11 HPI: 78 y/o female admitted to ED from SNF with difficulty swallowing and speaking for past 2 days.  PMH significant for recent TIA, GERD, Acute respiratory failure, diaphragmatic hernia repair, and dysphagia.  Patient with 3 prior MBS: April 2010, September 2011, and April of 2011.  Results of objective evaluations indicate patient presents with primarily anatomically based pharyngeal phase dysphagia secondary to questionable C2 and C3 -5 impact into posterior pharyngeal wall contributing to a narrow pharyngeal space.  Diet recommendations from most currrent study  was dysphagia 3 with thin liquids.   Type of Study: Bedside swallow evaluation Diet Prior to this Study: NPO Temperature Spikes Noted: No History of Intubation: No Behavior/Cognition: Cooperative;Lethargic Oral Cavity - Dentition: Dentures, top Vision: Impaired for self-feeding Patient Positioning: Upright in bed Baseline Vocal Quality: Clear Volitional Cough: Weak Volitional Swallow: Able to elicit  Oral Motor/Sensory Function  Overall Oral Motor/Sensory Function: Impaired Labial ROM: Reduced left Labial Symmetry: Abnormal symmetry left Labial Strength: Reduced Labial Sensation: Reduced Lingual ROM: Reduced left Lingual Symmetry: Abnormal symmetry left Lingual Strength: Reduced Lingual Sensation: Reduced Facial ROM: Reduced left Facial Symmetry: Left droop Facial Strength: Reduced Facial Sensation: Reduced Velum: Within Functional Limits Mandible: Within  Functional Limits  Consistency Results  Ice Chips Ice chips: Impaired Presentation: Spoon Oral Phase Impairments: Reduced labial seal;Impaired anterior to posterior transit Pharyngeal Phase Impairments: Delayed Swallow;Decreased hyoid-laryngeal movement;Wet Vocal Quality;Throat Clearing - Delayed  Thin Liquid Thin Liquid: Impaired Presentation: Cup;Spoon Oral Phase Impairments: Reduced labial seal Oral Phase Functional Implications: Left anterior spillage Pharyngeal  Phase Impairments: Delayed Swallow;Decreased hyoid-laryngeal movement;Throat Clearing - Delayed;Multiple swallows;Wet Vocal Quality  Nectar Thick Liquid Nectar Thick Liquid: Impaired Presentation: Spoon;Cup Oral Phase Impairments: Reduced labial seal Oral phase functional implications: Left anterior spillage Pharyngeal Phase Impairments: Delayed Swallow;Decreased hyoid-laryngeal movement;Throat Clearing - Delayed  Honey Thick Liquid Honey Thick Liquid: Impaired Oral Phase Impairments: Reduced labial seal Oral Phase Functional Implications: Left anterior spillage Pharyngeal Phase Impairments: Delayed Swallow;Decreased hyoid-laryngeal movement;Throat Clearing - Delayed;Multiple swallows;Wet Vocal Quality  Puree Puree: Impaired Presentation: Spoon Oral Phase Impairments: Reduced labial seal Pharyngeal Phase Impairments: Delayed Swallow;Decreased hyoid-laryngeal movement  Solid Solid: Not tested Moreen Fowler, M.S., CCC-SLP [phone]  [facility] 03/23/2011,11:28 AM

## 2011-03-23 NOTE — Progress Notes (Signed)
History: Shelia Webb is an 78 y.o. female who resides at nursing home. Although she was known to not be her baseline she was not brought to the hospital until today. Due to her weakness on her left side not improving for one week she was brought to the ED. CT head suggests right subacute BG infarct. Has UTI.  Subjective: Patient states she sees double and a shadow in right visual field with both eyes open.  No dizziness or headache. The patient is complaining of right leg pain. She indicates that she has not walked in 3 years.  Objective: BP 127/76  Pulse 74  Temp(Src) 97.9 F (36.6 C) (Oral)  Resp 20  Ht 5' (1.524 m)  Wt 71.201 kg (156 lb 15.5 oz)  BMI 30.66 kg/m2  SpO2 95%  CBGs  Basename 03/23/11 0654 03/22/11 2324  GLUCAP 119* 124*   Diet: D1, thin Activity: up with assistance DVT prophylaxis: lovenox   Medications: Scheduled:   . sodium chloride   Intravenous STAT  . aspirin  300 mg Rectal Daily   Or  . aspirin  325 mg Oral Daily  . calcium-vitamin D  1 tablet Oral Daily  . cefTRIAXone (ROCEPHIN)  IV  1 g Intravenous Once  . ciprofloxacin  400 mg Intravenous Q12H  . cycloSPORINE  1 drop Both Eyes BID  . DULoxetine  60 mg Oral Daily  . enoxaparin  40 mg Subcutaneous Q24H  . Fluticasone-Salmeterol  1 puff Inhalation Daily  . furosemide  20 mg Oral Daily  . gabapentin  400 mg Oral QHS  . insulin aspart  0-9 Units Subcutaneous TID WC  . mulitivitamin with minerals  1 tablet Oral Daily  . pantoprazole  40 mg Oral Q1200  . polyvinyl alcohol  1 drop Both Eyes QID  . predniSONE  10 mg Oral BID  . rOPINIRole  0.25 mg Oral BID  . senna  1 tablet Oral QHS  . simvastatin  20 mg Oral QHS  . Vitamin D (Ergocalciferol)  50,000 Units Oral Q7 days  . DISCONTD: Fluticasone-Salmeterol  1 puff Inhalation Daily  . DISCONTD: multivitamins ther. w/minerals  1 tablet Oral Daily  . DISCONTD: Polyethyl Glycol-Propyl Glycol  1 drop Both Eyes QID    Neurologic Exam: Mental  Status: Alert, oriented, thought content appropriate.  Speech fluent without evidence of aphasia. Able to follow 3 step commands without difficulty. Cranial Nerves: II- R inferolateral visual field cut.  Cannot reproduce diploplia with both eyes open, but pt prefers to keep R eye closed. III/IV/VI-Extraocular movements intact.  Pupils reactive bilaterally. V/VII-Smile symmetric VIII-hearing grossly intact IX/X-normal gag XI-bilateral shoulder shrug XII-midline tongue extension Motor: 5/5 bilaterally with normal tone and bulk Sensory: Light touch intact throughout, bilaterally Deep Tendon Reflexes: 2+ and symmetric throughout Plantars: Downgoing bilaterally Cerebellar: Normal finger-to-nose, normal rapid alternating movements and normal heel-to-shin test.   Lab Results: HEMOGLOBIN A1C     Status: Abnormal   Collection Time   03/22/11  4:13 PM      Component Value Range Comment   Hemoglobin A1C 11.0 (*) <5.7 (%)    Mean Plasma Glucose 269 (*) <117 (mg/dL)   CBC     Status: Abnormal   Collection Time   03/22/11  8:59 PM      Component Value Range Comment   WBC 12.2 (*) 4.0 - 10.5 (K/uL)    RBC 5.58 (*) 3.87 - 5.11 (MIL/uL)    Hemoglobin 14.8  12.0 - 15.0 (g/dL)  HCT 43.9  36.0 - 46.0 (%)    MCV 78.7  78.0 - 100.0 (fL)    MCH 26.5  26.0 - 34.0 (pg)    MCHC 33.7  30.0 - 36.0 (g/dL)    RDW 16.1 (*) 09.6 - 15.5 (%)    Platelets 177  150 - 400 (K/uL)   CREATININE, SERUM     Status: Abnormal   Collection Time   03/22/11  8:59 PM      Component Value Range Comment   Creatinine, Ser 0.66  0.50 - 1.10 (mg/dL)    GFR calc non Af Amer 77 (*) >90 (mL/min)    GFR calc Af Amer 89 (*) >90 (mL/min)   LIPID PANEL     Status: Normal   Collection Time   03/22/11 11:46 PM      Component Value Range Comment   Cholesterol 137  0 - 200 (mg/dL)    Triglycerides 045  <150 (mg/dL)    HDL 59  >40 (mg/dL)    Total CHOL/HDL Ratio 2.3      VLDL 24  0 - 40 (mg/dL)    LDL Cholesterol 54  0 - 99 (mg/dL)      Study Results:  09/28/1189   CT HEAD WITHOUT CONTRAST Findings: There is an infarct in the right basal ganglia, new since prior study. There is atrophy and chronic small vessel disease changes.  No hydrocephalus.  No hemorrhage.  Sinusitis changes in the maxillary sinuses, somewhat improved since prior study.  Mastoids are clear.  IMPRESSION: Likely subacute infarct in the right basal ganglia, new since prior study.  Atrophy, chronic small vessel disease. Cyndie Chime, M.D.   . 03/22/2011   MRI HEAD WITHOUT CONTRAST  Findings:  Acute infarct posterior limb internal capsule on the right.  No other acute infarcts.  Generalized atrophy mild chronic microvascular ischemia in the white matter. Chronic ischemic changes in the thalami bilaterally. The brainstem is intact.  Negative for hemorrhage or mass lesion.  Chronic sinusitis with mucosal edema in the maxillary sinus bilaterally.   IMPRESSION: Acute infarct posterior limb internal capsule on the right.  Atrophy and mild chronic microvascular ischemia.  Camelia Phenes, M.D.   03/22/2011 MRA HEAD  Findings: Image quality degraded by artifact.  Tortuosity of the distal vertebral arteries and basilar causes decreased signal.  There is decreased signal in the proximal basilar making it difficult to evaluate.  The vertebral arteries and basilar are patent.  Superior cerebellar and posterior cerebral arteries are patent bilaterally.  Internal carotid artery is patent bilaterally without stenosis. Anterior and middle cerebral arteries are patent bilaterally without stenosis.  Negative for cerebral aneurysm.  IMPRESSION: Distal vertebral arteries and proximal basilar are not adequately evaluated due to artifact.  Otherwise negative. Camelia Phenes, M.D.   02/18/11 TCD Summary: Highly limited suboptimal study due to poor windows throughout. Antegrade flow in right middle cerebral, left opthalmic and both vertebral arteries.  Pearlean Brownie, MD.  02/18/11 2D echo Left  ventricle: There was moderate concentric hypertrophy. Systolic function was vigorous. The estimated ejection fraction was in the range of 65% to 70%.There is a mild to moderate left ventricular outflow tract gradient of peak. Doppler parameters are consistent with abnormal left ventricular relaxation (grade 1 diastolic dysfunction). Doppler parameters are consistent with high ventricular filling pressure. Impressions: No cardiac source of emboli was indentified.  Assessment/Plan: 78 yo nursing home resident  1. Acute infarct Right posterior limb internal capsule-ischemic, on ASA 325 mg.  C/o right     inferolateral visual field deficit.  Recommend changing ASA to Plavix for secondary stroke     Prevention vs coumadin- see below. 2. S/P TIA 1/13- treated with ASA 325 mg. 3. AFib- not on coumadin-History of atrial fibrillation: Reviewed her records in E chart and patient had a brief period of atrial fibrillation in June of 2011 when she had septic shock. She has however not been on any anticoagulation. She does have a CHADS 2 score probably of 5. 4. UTI- on rocephin 5. DM2, uncontrolled.  A1C 11.  Needs aggressive diabetes control. 6. HTN- controlled and at goal. 7. Hyperlipidemia-@ goal on Zocor.  LDL 47   8. Carotid dopplers pending 9. PT and OT eval    LOS: 1 day   Marya Fossa PA-C [facility] [phone] 03/23/2011  8:03 AM  Lesly Dukes

## 2011-03-24 ENCOUNTER — Inpatient Hospital Stay (HOSPITAL_COMMUNITY): Payer: Medicare Other

## 2011-03-24 LAB — GLUCOSE, CAPILLARY
Glucose-Capillary: 252 mg/dL — ABNORMAL HIGH (ref 70–99)
Glucose-Capillary: 290 mg/dL — ABNORMAL HIGH (ref 70–99)

## 2011-03-24 MED ORDER — INSULIN ASPART 100 UNIT/ML ~~LOC~~ SOLN: 0.0000 [IU] | Freq: Every day | SUBCUTANEOUS | Status: DC

## 2011-03-24 MED ORDER — INSULIN ASPART 100 UNIT/ML ~~LOC~~ SOLN
0.0000 [IU] | SUBCUTANEOUS | Status: DC
  Filled 2011-03-24: qty 3

## 2011-03-24 MED ORDER — INSULIN ASPART 100 UNIT/ML ~~LOC~~ SOLN
0.0000 [IU] | Freq: Three times a day (TID) | SUBCUTANEOUS | Status: DC
  Administered 2011-03-25: 3 [IU] via SUBCUTANEOUS
  Administered 2011-03-25: 5 [IU] via SUBCUTANEOUS
  Administered 2011-03-25: 3 [IU] via SUBCUTANEOUS

## 2011-03-24 MED ORDER — STARCH (THICKENING) PO POWD
ORAL | Status: DC | PRN
  Filled 2011-03-24: qty 227

## 2011-03-24 NOTE — Procedures (Signed)
Modified Barium Swallow Procedure Note Patient Details  Name: Shelia Webb MRN: [id number] Date of Birth: [birth year]  Today's Date: 03/24/2011 Time:  -     Past Medical History:  Past Medical History  Diagnosis Date  . Hypertension   . Diabetes mellitus   . Dysrhythmia     a-fib  . Anemia   . Acute respiratory failure   . Chronic kidney disease     Kidney failure  . Allergic rhinitis   . Osteoarthrosis   . Osteoporosis   . Anxiety   . Depression   . Restless leg syndrome   . GERD (gastroesophageal reflux disease)   . Hypertonic bladder   . Peripheral vascular disease   . Coronary atherosclerosis due to lipid rich plaque   . DNR (do not resuscitate)    Past Surgical History:  Past Surgical History  Procedure Date  . Colon surgery     has colostomy  . Abdominal hysterectomy   . Appendectomy   . Tonsillectomy   . Cardiac catheterization     bilateral cataract surgery  . Diaphragmatic hernia repair     with gangrene  . Orif patella 12/07/2010    Procedure: OPEN REDUCTION INTERNAL (ORIF) FIXATION PATELLA;  Surgeon: Nadara Mustard, MD;  Location: [facility] OR;  Service: Orthopedics;  Laterality: Left;  Left Knee Hamstring Release and Open Reduction Internal Fixation Patella  . Orif patella 12/21/2010    Procedure: OPEN REDUCTION INTERNAL (ORIF) FIXATION PATELLA;  Surgeon: Nadara Mustard, MD;  Location: [facility] OR;  Service: Orthopedics;  Laterality: Left;  Left Knee Hamstring Release and Open Reduction Internal Fixation Patella   HPI:    78 y/o female admitted to ED from SNF with difficulty swallowing and speaking.  MRI completed on 03-22-11 indicates acute infarct posterior limb internal capsule on the right.  Patient with PMH significant for TIA's and dysphagia.  Review of e-chart reveals patient had 3 prior MBS completed: April of 2010, September of 2011, and April of 2011. Results of most recent MBS indicates patient with primarily anatomically based pharyngeal phase dysphagia  secondary to questionable C2 and C3-C5 impact into posterior pharyngeal wall. Recommendations made were dysphagia 3 diet consistency and thin liquids.  BSE completed on 03-23-11 indicates dysphagia with MBS warranted to assess risk for aspiration and recommend safest, possible diet consistency.      Recommendation/Prognosis  Clinical Impression Dysphagia Diagnosis: Moderate oral phase dysphagia;Moderate pharyngeal phase dysphagia Clinical impression:  Sensory-motor oral dysphagia marked by decreased lingual strength and coordination affecting anterior to posterior propulsion.  Trial of mechanical soft adhered to hard palate with several attempts by patient to propel posteriorly.  Pharyngeal dysphagia combination of sensory-motor and anatomical .  C3-C5 appears to impead into posterior wall  narrowing pharynx slowing bolus transit of solids.  One swallow of thin liquid barium by spoon, puree consistency, and honey thick liquid initiated at valleculae with thin liquid by cup [place] and  nectar by cup/straw initiated at [employer]. One occurance of  penetration and eventually aspiration with nectar  liquid by spoon with sensed cough.  Silent aspiration occurred during swallow of thin and nectar liquid by spoon , cup/straw.  Compensatory strategy of chin tuck effective x1 trial  in preventing penetration and aspiration with thin liquid by cup but patient required max tactile and verbal cues to complete . Slight residuals in vallecular space s/p swallow of honey thick liquid but cleared with instructed second swallow.  Flash penetration x1 occurred during the swallow with  mechanical soft trial as bolus hung in pharynx. Whole barium tablet lodged in valleculae with several swallows required to clear.  Brief esophageal sweep revealed  slow transit with barium tablet  with stasis in distal esophagus. Second swallow of puree required to complete transit in esophagus.    Recommend to continue dysphagia 1 diet consistency  with honey thick liquids by cup.  Recommend swallow treatment at next level of care. Repeat MBS before upgrading diet secondary to noted silent aspiration with nectar and thin liquids.   Swallow Evaluation Recommendations Solid Consistency: Dysphagia 1 (Puree) Liquid Consistency: Honey Liquid Administration via: Cup Medication Administration: Crushed with puree Supervision: Full supervision/cueing for compensatory strategies Compensations: Slow rate;Small sips/bites;Clear throat intermittently Postural Changes and/or Swallow Maneuvers: Out of bed for meals Oral Care Recommendations: Oral care before and after PO Other Recommendations: Order thickener from pharmacy;Clarify dietary restrictions;Prohibited food (jello, ice cream, thin soups);Remove water pitcher;Have oral suction available Follow up Recommendations: Skilled Nursing facility Prognosis Prognosis for Safe Diet Advancement: Fair Barriers to Reach Goals: Cognitive deficits Individuals Consulted Consulted and Agree with Results and Recommendations: Patient;RN  SLP Assessment/Plan Speech Therapy Frequency: min 2x/week Duration: 2 weeks Potential to Achieve Goals: Good Potential Considerations: Co-morbidities;Ability to learn/carryover information;Medical prognosis;Previous level of function  SLP Goals  SLP Swallowing Goals Swallow Study Goal #1 - Progress: Progressing toward goal Swallow Study Goal #2 - Progress: Progressing toward goal  General:  Date of Onset: 03/22/11 HPI: 78 y/o female admitted to ED from SNF with difficulty swallowing and speaking for past 2 days.  PMH significant for recent TIA, GERD, Acute respiratory failure, diaphragmatic hernia repair, and dysphagia.  Patient with 3 prior MBS: April 2010, September 2011, and April of 2011.  Results of objective evaluations indicate patient presents with primarily anatomically based pharyngeal phase dysphagia secondary to questionable C2 and C3 -5 impact into posterior  pharyngeal wall contributing to a narrow pharyngeal space.  Diet recommendations from most currrent study  was dysphagia 3 with thin liquids.   Type of Study: Initial MBS Diet Prior to this Study: Dysphagia 1 (puree) Temperature Spikes Noted: No Respiratory Status: Room air History of Intubation: No Behavior/Cognition: Cooperative;Lethargic;Requires cueing Oral Cavity - Dentition: Dentures, top Oral Motor / Sensory Function: Impaired - see Bedside swallow eval Vision: Impaired for self-feeding Patient Positioning: Upright in chair Baseline Vocal Quality: Clear Volitional Cough: Weak Volitional Swallow: Able to elicit Anatomy: Within functional limits Pharyngeal Secretions: Not observed secondary MBS   Oral Phase Oral Preparation/Oral Phase Oral Phase: Impaired Oral - Honey Oral - Honey Teaspoon: Reduced posterior propulsion;Holding of bolus;Weak lingual manipulation;Lingual pumping;Delayed oral transit;Decreased velopharyngeal closure Oral - Honey Cup: Lingual pumping;Reduced posterior propulsion;Holding of bolus;Piecemeal swallowing;Delayed oral transit;Decreased velopharyngeal closure Oral - Nectar Oral - Nectar Teaspoon: Reduced posterior propulsion;Holding of bolus;Piecemeal swallowing;Delayed oral transit;Lingual pumping;Decreased velopharyngeal closure Oral - Nectar Cup: Lingual pumping;Delayed oral transit;Weak lingual manipulation;Piecemeal swallowing;Decreased velopharyngeal closure Oral - Nectar Straw: Delayed oral transit;Lingual pumping;Reduced posterior propulsion;Holding of bolus;Weak lingual manipulation;Piecemeal swallowing;Decreased velopharyngeal closure Oral - Thin Oral - Thin Teaspoon: Delayed oral transit;Piecemeal swallowing;Decreased velopharyngeal closure Oral - Thin Cup: Delayed oral transit;Piecemeal swallowing;Decreased velopharyngeal closure Oral - Thin Straw: Delayed oral transit;Holding of bolus;Weak lingual manipulation;Decreased velopharyngeal  closure Oral - Solids Oral - Puree: Weak lingual manipulation;Reduced posterior propulsion;Piecemeal swallowing;Decreased velopharyngeal closure;Delayed oral transit;Lingual pumping Oral - Mechanical Soft: Decreased velopharyngeal closure;Delayed oral transit;Piecemeal swallowing;Holding of bolus;Weak lingual manipulation;Lingual pumping Oral - Pill: Decreased velopharyngeal closure;Delayed oral transit;Lingual pumping Pharyngeal Phase  Pharyngeal Phase Pharyngeal Phase: Impaired Pharyngeal -  Honey Pharyngeal - Honey Teaspoon: Reduced airway/laryngeal closure;Reduced tongue base retraction;Pharyngeal residue - valleculae;Delayed swallow initiation Pharyngeal - Honey Cup: Delayed swallow initiation;Pharyngeal residue - valleculae;Reduced airway/laryngeal closure;Reduced tongue base retraction Pharyngeal - Nectar Pharyngeal - Nectar Teaspoon: Reduced airway/laryngeal closure;Reduced tongue base retraction;Penetration/Aspiration during swallow Pharyngeal - Nectar Cup: Penetration/Aspiration during swallow;Reduced airway/laryngeal closure;Reduced tongue base retraction;Premature spillage to pyriform Pharyngeal - Nectar Straw: Reduced tongue base retraction;Penetration/Aspiration during swallow;Reduced airway/laryngeal closure;Trace aspiration;Pharyngeal residue - posterior pharnyx;Premature spillage to pyriform Penetration/Aspiration details (nectar straw): Material enters airway, CONTACTS cords and not ejected out;Material enters airway, passes BELOW cords and not ejected out despite cough attempt by patient Pharyngeal - Thin Pharyngeal - Thin Teaspoon: Reduced airway/laryngeal closure;Reduced tongue base retraction;Penetration/Aspiration before swallow;Penetration/Aspiration during swallow;Pharyngeal residue - posterior pharnyx;Premature spillage to pyriform Penetration/Aspiration details (thin teaspoon): Material enters airway, passes BELOW cords without attempt by patient to eject out (silent  aspiration);Material enters airway, remains ABOVE vocal cords and not ejected out Pharyngeal - Thin Cup: Reduced airway/laryngeal closure;Reduced tongue base retraction;Penetration/Aspiration during swallow;Pharyngeal residue - posterior pharnyx Penetration/Aspiration details (thin cup): Material enters airway, passes BELOW cords without attempt by patient to eject out (silent aspiration) Pharyngeal - Thin Straw: Reduced airway/laryngeal closure;Reduced tongue base retraction;Penetration/Aspiration during swallow;Trace aspiration Penetration/Aspiration details (thin straw): Material enters airway, passes BELOW cords without attempt by patient to eject out (silent aspiration) Pharyngeal - Solids Pharyngeal - Puree: Reduced airway/laryngeal closure;Reduced tongue base retraction;Premature spillage to valleculae Pharyngeal - Mechanical Soft: Reduced airway/laryngeal closure;Reduced tongue base retraction;Pharyngeal residue - valleculae;Pharyngeal residue - posterior pharnyx Pharyngeal - Pill: Reduced airway/laryngeal closure;Reduced tongue base retraction;Premature spillage to valleculae Cervical Esophageal Phase  Cervical Esophageal Phase Cervical Esophageal Phase: Impaired Cervical Esophageal Phase - Solids Pill: Other (Comment) Cervical Esophageal Phase - Comment Cervical Esophageal Comment: whole barium tablet lodged in distal esophagus cleared followed by puree bolus with effortful swallow   Moreen Fowler MS, CCC-SLP [phone]  [facility] 03/24/2011, 3:16 PM

## 2011-03-24 NOTE — Progress Notes (Signed)
Subjective: Lethargic. Only answer yes and no question Objective: Filed Vitals:   03/23/11 2139 03/24/11 0527 03/24/11 0837 03/24/11 1242  BP: 138/86 117/68  130/82  Pulse: 98 68  66  Temp: 98.6 F (37 C) 98.1 F (36.7 C)  97.2 F (36.2 C)  TempSrc: Oral Oral    Resp: 20 20  17   Height:      Weight:      SpO2: 96% 98% 96% 97%   Weight change:   Intake/Output Summary (Last 24 hours) at 03/24/11 1350 Last data filed at 03/23/11 1445  Gross per 24 hour  Intake 764.17 ml  Output      0 ml  Net 764.17 ml    General: lethargic, in no acute distress.  HEENT: No bruits, no goiter.  Heart: Regular rate and rhythm, without murmurs, rubs, gallops.  Lungs: Crackles left side, bilateral air movement.  Abdomen: Soft, nontender, nondistended, positive bowel sounds.  Neuro: Grossly intact, nonfocal.   Lab Results:  [facility] 03/22/11 2059 03/22/11 1601  NA -- 135  K -- 3.7  CL -- 98  CO2 -- 26  GLUCOSE -- 203*  BUN -- 23  CREATININE 0.66 0.69  CALCIUM -- 9.6  MG -- --  PHOS -- --    Basename 03/22/11 1601  AST 19  ALT 22  ALKPHOS 106  BILITOT 0.3  PROT 7.6  ALBUMIN 3.5   No results found for this basename: LIPASE:2,AMYLASE:2 in the last 72 hours  Basename 03/22/11 2059 03/22/11 1601  WBC 12.2* 12.4*  NEUTROABS -- 7.9*  HGB 14.8 14.7  HCT 43.9 45.8  MCV 78.7 80.2  PLT 177 216   No results found for this basename: CKTOTAL:3,CKMB:3,CKMBINDEX:3,TROPONINI:3 in the last 72 hours No components found with this basename: POCBNP:3 No results found for this basename: DDIMER:2 in the last 72 hours  Basename 03/22/11 2346 03/22/11 1613  HGBA1C 11.3* 11.0*    Basename 03/23/11 0700 03/22/11 2346  CHOL 140 137  HDL 51 59  LDLCALC 47 54  TRIG 209* 118  CHOLHDL 2.7 2.3  LDLDIRECT -- --   No results found for this basename: TSH,T4TOTAL,FREET3,T3FREE,THYROIDAB in the last 72 hours No results found for this basename:  VITAMINB12:2,FOLATE:2,FERRITIN:2,TIBC:2,IRON:2,RETICCTPCT:2 in the last 72 hours  Micro Results: Recent Results (from the past 240 hour(s))  MRSA PCR SCREENING     Status: Normal   Collection Time   03/22/11 11:45 PM      Component Value Range Status Comment   MRSA by PCR NEGATIVE  NEGATIVE  Final     Studies/Results: Dg Chest 1 View  03/22/2011  *RADIOLOGY REPORT*  Clinical Data: Short of breath  CHEST - 1 VIEW  Comparison: 02/15/2011  Findings: Mild cardiomegaly.  Normal vascularity.  Thorax is rotated to the right limiting the examination.  Lungs are grossly clear.  No pneumothorax.  IMPRESSION: Cardiomegaly without edema.  Original Report Authenticated By: Donavan Burnet, M.D.   Ct Head Wo Contrast  03/24/2011  *RADIOLOGY REPORT*  Clinical Data: Unresponsive, previous stroke  CT HEAD WITHOUT CONTRAST  Technique:  Contiguous axial images were obtained from the base of the skull through the vertex without contrast.  Comparison: 03/22/2011  Findings: Chronic bilateral maxillary sinus disease. Atherosclerotic and physiologic intracranial calcifications. Progressive evolution of a non hemorrhagic subacute infarct involving the right basal ganglia and posterior limb right internal capsule.  No acute intracranial hemorrhage, midline shift, mass, mass effect.  Acute infarct may be inapparent on noncontrast CT. Mild diffuse parenchymal atrophy  with resultant prominence of ventricles and sulci as before. Bone windows demonstrate no acute calvarial lesion.  IMPRESSION:  Some interval evolution of right basal ganglia/internal capsule non hemorrhagic infarct.  No acute or superimposed abnormality.  Original Report Authenticated By: Osa Craver, M.D.   Ct Head Wo Contrast  03/22/2011  *RADIOLOGY REPORT*  Clinical Data: Dysphagia.  CT HEAD WITHOUT CONTRAST  Technique:  Contiguous axial images were obtained from the base of the skull through the vertex without contrast.  Comparison: 02/15/2011  Findings:  There is an infarct in the right basal ganglia, new since prior study. There is atrophy and chronic small vessel disease changes.  No hydrocephalus.  No hemorrhage.  Sinusitis changes in the maxillary sinuses, somewhat improved since prior study.  Mastoids are clear.  IMPRESSION: Likely subacute infarct in the right basal ganglia, new since prior study.  Atrophy, chronic small vessel disease.  Original Report Authenticated By: Cyndie Chime, M.D.   Mr Maxine Glenn Head Wo Contrast  03/22/2011  *RADIOLOGY REPORT*  Clinical Data:  Stroke.  Dysphagia and slurred speech.  MRI HEAD WITHOUT CONTRAST MRA HEAD WITHOUT CONTRAST  Technique:  Multiplanar, multiecho pulse sequences of the brain and surrounding structures were obtained without intravenous contrast. Angiographic images of the head were obtained using MRA technique without contrast.  Comparison:  CT 03/22/2011  MRI HEAD  Findings:  Acute infarct posterior limb internal capsule on the right.  No other acute infarcts.  Generalized atrophy mild chronic microvascular ischemia in the white matter. Chronic ischemic changes in the thalami bilaterally. The brainstem is intact.  Negative for hemorrhage or mass lesion.  Chronic sinusitis with mucosal edema in the maxillary sinus bilaterally.  IMPRESSION: Acute infarct posterior limb internal capsule on the right.  Atrophy and mild chronic microvascular ischemia.  MRA HEAD  Findings: Image quality degraded by artifact.  Tortuosity of the distal vertebral arteries and basilar causes decreased signal.  There is decreased signal in the proximal basilar making it difficult to evaluate.  The vertebral arteries and basilar are patent.  Superior cerebellar and posterior cerebral arteries are patent bilaterally.  Internal carotid artery is patent bilaterally without stenosis. Anterior and middle cerebral arteries are patent bilaterally without stenosis.  Negative for cerebral aneurysm.  IMPRESSION: Distal vertebral arteries and proximal  basilar are not adequately evaluated due to artifact.  Otherwise negative.  Original Report Authenticated By: Camelia Phenes, M.D.   Mr Brain Wo Contrast  03/22/2011  *RADIOLOGY REPORT*  Clinical Data:  Stroke.  Dysphagia and slurred speech.  MRI HEAD WITHOUT CONTRAST MRA HEAD WITHOUT CONTRAST  Technique:  Multiplanar, multiecho pulse sequences of the brain and surrounding structures were obtained without intravenous contrast. Angiographic images of the head were obtained using MRA technique without contrast.  Comparison:  CT 03/22/2011  MRI HEAD  Findings:  Acute infarct posterior limb internal capsule on the right.  No other acute infarcts.  Generalized atrophy mild chronic microvascular ischemia in the white matter. Chronic ischemic changes in the thalami bilaterally. The brainstem is intact.  Negative for hemorrhage or mass lesion.  Chronic sinusitis with mucosal edema in the maxillary sinus bilaterally.  IMPRESSION: Acute infarct posterior limb internal capsule on the right.  Atrophy and mild chronic microvascular ischemia.  MRA HEAD  Findings: Image quality degraded by artifact.  Tortuosity of the distal vertebral arteries and basilar causes decreased signal.  There is decreased signal in the proximal basilar making it difficult to evaluate.  The vertebral arteries and basilar are  patent.  Superior cerebellar and posterior cerebral arteries are patent bilaterally.  Internal carotid artery is patent bilaterally without stenosis. Anterior and middle cerebral arteries are patent bilaterally without stenosis.  Negative for cerebral aneurysm.  IMPRESSION: Distal vertebral arteries and proximal basilar are not adequately evaluated due to artifact.  Otherwise negative.  Original Report Authenticated By: Camelia Phenes, M.D.    Medications: I have reviewed the patient's current medications.  Assessment and plan: Principal Problem:  *CVA (cerebral infarction) Right posterior Limb internal capsule: -On plavix  secondary stroke prevention. Worsening mentation pt only answering yes and no questions. CT head show worsening stroke. Will d/w with neurology prognosis and then with family.  -UTI: -continue rocephin, UC pending.  2. DM -d/c lantus  3.HYPERTENSION  4.CAD: contineu plavix.  5. Dysphagia NPO, pt lethargic.     LOS: 2 days   Marinda Elk M.D. Pager: [phone] [facility] 03/24/2011, 1:50 PM

## 2011-03-24 NOTE — Progress Notes (Signed)
History: Shelia Webb is an 78 y.o. female who resides at nursing home. Although she was known to not be her baseline she was not brought to the hospital until today. Due to her weakness on her left side not improving for one week she was brought to the ED. CT head suggests right subacute BG infarct. Has UTI.  Subjective: Patient lethargic.  Mutters some faint coherent words, falls readily back to sleep, snoring.  Unable to open eyes.  Nurse reports pt has been this way all morning.  Got 1 percocet at 1100 yesterday, and 1 trazodone at 2244 last pm.  Minimal output in colostomy bag over 2 days despite eating well yesterday.  Good urine output per nurse.  Objective: BP 117/68  Pulse 68  Temp(Src) 98.1 F (36.7 C) (Oral)  Resp 20  Ht 5' (1.524 m)  Wt 71.201 kg (156 lb 15.5 oz)  BMI 30.66 kg/m2  SpO2 98%  CBGs  Basename 03/23/11 2202 03/23/11 1623 03/23/11 1132 03/23/11 0654 03/22/11 2324  GLUCAP 290* 226* 209* 119* 124*   Diet: D1, thin Activity: up with assistance DVT prophylaxis: lovenox   Medications: Scheduled:    . aspirin  300 mg Rectal Daily   Or  . aspirin  325 mg Oral Daily  . calcium-vitamin D  1 tablet Oral Daily  . ciprofloxacin  400 mg Intravenous Q12H  . cycloSPORINE  1 drop Both Eyes BID  . DULoxetine  60 mg Oral Daily  . enoxaparin  40 mg Subcutaneous Q24H  . Fluticasone-Salmeterol  1 puff Inhalation Daily  . furosemide  20 mg Oral Daily  . gabapentin  400 mg Oral QHS  . insulin aspart  0-9 Units Subcutaneous TID WC  . insulin glargine  15 Units Subcutaneous QHS  . mulitivitamin with minerals  1 tablet Oral Daily  . pantoprazole  40 mg Oral Q1200  . polyvinyl alcohol  1 drop Both Eyes QID  . predniSONE  10 mg Oral BID  . rOPINIRole  0.25 mg Oral BID  . senna  1 tablet Oral QHS  . simvastatin  20 mg Oral QHS  . Vitamin D (Ergocalciferol)  50,000 Units Oral Q7 days    Exam: Lungs: CTA anteriorly Cor IRRR. Abd: soft, +BS, minimal material in colotsomy  bag.  Ostomy site appears WNL. Mental Status: Somnolent, lethargic, unable to fully wake patient.  Responds to noxious stimulation with grimace, muttering and withdrawal, but sustained alertness. Cranial Nerves: III/IV/VI-Doll's eye intact intact.  Pupils reactive bilaterally. V/VII-unable to assess due to lack of cooperation VIII-hearing grossly intact XII-midline tongue extension Motor: squeezes hands on command,  Grips equal. Sensory: responds to noxious stim bilaterally Cerebellar: unable to assess due to patient factors.  Following a bath this morning, the patient has now re-alerted, and is verbal. The patient will open her eyes and move both upper extremities. The lower extremities remain stiff.  Lab Results: HEMOGLOBIN A1C     Status: Abnormal   Collection Time   03/22/11  4:13 PM      Component Value Range Comment   Hemoglobin A1C 11.0 (*) <5.7 (%)    Mean Plasma Glucose 269 (*) <117 (mg/dL)   CBC     Status: Abnormal   Collection Time   03/22/11  8:59 PM      Component Value Range Comment   WBC 12.2 (*) 4.0 - 10.5 (K/uL)    RBC 5.58 (*) 3.87 - 5.11 (MIL/uL)    Hemoglobin 14.8  12.0 - 15.0 (  g/dL)    HCT 16.1  09.6 - 04.5 (%)    MCV 78.7  78.0 - 100.0 (fL)    MCH 26.5  26.0 - 34.0 (pg)    MCHC 33.7  30.0 - 36.0 (g/dL)    RDW 40.9 (*) 81.1 - 15.5 (%)    Platelets 177  150 - 400 (K/uL)   CREATININE, SERUM     Status: Abnormal   Collection Time   03/22/11  8:59 PM      Component Value Range Comment   Creatinine, Ser 0.66  0.50 - 1.10 (mg/dL)    GFR calc non Af Amer 77 (*) >90 (mL/min)    GFR calc Af Amer 89 (*) >90 (mL/min)   LIPID PANEL     Status: Normal   Collection Time   03/22/11 11:46 PM      Component Value Range Comment   Cholesterol 137  0 - 200 (mg/dL)    Triglycerides 914  <150 (mg/dL)    HDL 59  >78 (mg/dL)    Total CHOL/HDL Ratio 2.3      VLDL 24  0 - 40 (mg/dL)    LDL Cholesterol 54  0 - 99 (mg/dL)     Study Results:  03/02/5619   CT HEAD WITHOUT  CONTRAST Findings: There is an infarct in the right basal ganglia, new since prior study. There is atrophy and chronic small vessel disease changes.  No hydrocephalus.  No hemorrhage.  Sinusitis changes in the maxillary sinuses, somewhat improved since prior study.  Mastoids are clear.  IMPRESSION: Likely subacute infarct in the right basal ganglia, new since prior study.  Atrophy, chronic small vessel disease. Cyndie Chime, M.D.   . 03/22/2011   MRI HEAD WITHOUT CONTRAST  Findings:  Acute infarct posterior limb internal capsule on the right.  No other acute infarcts.  Generalized atrophy mild chronic microvascular ischemia in the white matter. Chronic ischemic changes in the thalami bilaterally. The brainstem is intact.  Negative for hemorrhage or mass lesion.  Chronic sinusitis with mucosal edema in the maxillary sinus bilaterally.   IMPRESSION: Acute infarct posterior limb internal capsule on the right.  Atrophy and mild chronic microvascular ischemia.  Camelia Phenes, M.D.   03/22/2011 MRA HEAD  Findings: Image quality degraded by artifact.  Tortuosity of the distal vertebral arteries and basilar causes decreased signal.  There is decreased signal in the proximal basilar making it difficult to evaluate.  The vertebral arteries and basilar are patent.  Superior cerebellar and posterior cerebral arteries are patent bilaterally.  Internal carotid artery is patent bilaterally without stenosis. Anterior and middle cerebral arteries are patent bilaterally without stenosis.  Negative for cerebral aneurysm.  IMPRESSION: Distal vertebral arteries and proximal basilar are not adequately evaluated due to artifact.  Otherwise negative. Camelia Phenes, M.D.   02/18/11 TCD Summary: Highly limited suboptimal study due to poor windows throughout. Antegrade flow in right middle cerebral, left opthalmic and both vertebral arteries.  Pearlean Brownie, MD.  02/18/11 2D echo Left ventricle: There was moderate concentric hypertrophy.  Systolic function was vigorous. The estimated ejection fraction was in the range of 65% to 70%.There is a mild to moderate left ventricular outflow tract gradient of peak. Doppler parameters are consistent with abnormal left ventricular relaxation (grade 1 diastolic dysfunction). Doppler parameters are consistent with high ventricular filling pressure. Impressions: No cardiac source of emboli was indentified.  Assessment/Plan: 78 yo nursing home resident  1. 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 Now with somnolence- STAT Head CT w/o contrast ordered.  2. S/P TIA 1/13- treated with ASA 325 mg. 3. AFib- not on coumadin-History of atrial fibrillation: Reviewed her records in E chart and patient had a brief period of atrial fibrillation in June of 2011 when she had septic shock. She has however not been on any anticoagulation. She does have a CHADS 2 score probably of 5. 4. UTI- on rocephin 5. DM2, uncontrolled.  A1C 11.  Needs aggressive diabetes control. 6. HTN- controlled and at goal. 7. Hyperlipidemia-@ goal on Zocor.  LDL 47   8. PT and OT eval  Case discussed with Dr. Anne Hahn who agrees with above plan.   CT of the head is pending at this time, clinical exam has improved.  Plans for a modified barium swallow. The patient currently is on a pured diet. The patient is felt to be a high risk for aspiration.      LOS: 2 days   Marya Fossa PA-C [facility] [phone] 03/24/2011  7:53 AM  JERNEJCIC,TARA [facility]

## 2011-03-25 LAB — GLUCOSE, CAPILLARY
Glucose-Capillary: 225 mg/dL — ABNORMAL HIGH (ref 70–99)
Glucose-Capillary: 226 mg/dL — ABNORMAL HIGH (ref 70–99)
Glucose-Capillary: 252 mg/dL — ABNORMAL HIGH (ref 70–99)

## 2011-03-25 MED ORDER — OXYCODONE-ACETAMINOPHEN 5-325 MG PO TABS: 1.0000 | ORAL_TABLET | Freq: Four times a day (QID) | ORAL | Status: DC | PRN

## 2011-03-25 MED ORDER — INSULIN ASPART 100 UNIT/ML ~~LOC~~ SOLN: 6.0000 [IU] | Freq: Three times a day (TID) | SUBCUTANEOUS | Status: DC

## 2011-03-25 MED ORDER — CIPROFLOXACIN HCL 250 MG PO TABS
250.0000 mg | ORAL_TABLET | Freq: Two times a day (BID) | ORAL | Status: DC
  Administered 2011-03-25: 250 mg via ORAL
  Filled 2011-03-25 (×3): qty 1

## 2011-03-25 MED ORDER — CIPROFLOXACIN HCL 250 MG PO TABS: 250.0000 mg | ORAL_TABLET | Freq: Two times a day (BID) | ORAL | Status: DC

## 2011-03-25 MED ORDER — CIPROFLOXACIN HCL 250 MG PO TABS: 250.0000 mg | ORAL_TABLET | Freq: Two times a day (BID) | ORAL | Status: AC

## 2011-03-25 NOTE — Discharge Instructions (Signed)
STROKE/TIA DISCHARGE INSTRUCTIONS SMOKING Cigarette smoking nearly doubles your risk of having a stroke & is the single most alterable risk factor  If you smoke or have smoked in the last 12 months, you are advised to quit smoking for your health.  Most of the excess cardiovascular risk related to smoking disappears within a year of stopping.  Ask you doctor about anti-smoking medications  [place] Quit Line: 1-800-QUIT NOW  Free Smoking Cessation Classes [phone]  CHOLESTEROL Know your levels; limit fat & cholesterol in your diet  Lipid Panel     Component Value Date/Time   CHOL 140 03/23/2011 0700   TRIG 209* 03/23/2011 0700   HDL 51 03/23/2011 0700   CHOLHDL 2.7 03/23/2011 0700   VLDL 42* 03/23/2011 0700   LDLCALC 47 03/23/2011 0700      Many patients benefit from treatment even if their cholesterol is at goal.  Goal: Total Cholesterol (CHOL) less than 160  Goal:  Triglycerides (TRIG) less than 150  Goal:  HDL greater than 40  Goal:  LDL (LDLCALC) less than 100   BLOOD PRESSURE American Stroke Association blood pressure target is less that 120/80 mm/Hg  Your discharge blood pressure is:  BP: 123/72 mmHg  Monitor your blood pressure  Limit your salt and alcohol intake  Many individuals will require more than one medication for high blood pressure  DIABETES (A1c is a blood sugar average for last 3 months) Goal HGBA1c is under 7% (HBGA1c is blood sugar average for last 3 months)  Diabetes: {STROKE DC DIABETES:22357}    Lab Results  Component Value Date   HGBA1C 11.3* 03/22/2011     Your HGBA1c can be lowered with medications, healthy diet, and exercise.  Check your blood sugar as directed by your physician  Call your physician if you experience unexplained or low blood sugars.  PHYSICAL ACTIVITY/REHABILITATION Goal is 30 minutes at least 4 days per week    {STROKE DC ACTIVITY/REHAB:22359}  Activity decreases your risk of heart attack and stroke and makes your heart stronger.   It helps control your weight and blood pressure; helps you relax and can improve your mood.  Participate in a regular exercise program.  Talk with your doctor about the best form of exercise for you (dancing, walking, swimming, cycling).  DIET/WEIGHT Goal is to maintain a healthy weight  Your discharge diet is: Dysphagia *** liquids Your height is:  Height: 5' (152.4 cm) Your current weight is: Weight: 71.201 kg (156 lb 15.5 oz) Your Body Mass Index (BMI) is:  BMI (Calculated): 30.7   Following the type of diet specifically designed for you will help prevent another stroke.  Your goal weight range is:  ***  Your goal Body Mass Index (BMI) is 19-24.  Healthy food habits can help reduce 3 risk factors for stroke:  High cholesterol, hypertension, and excess weight.  RESOURCES Stroke/Support Group:  Call [phone]  they meet the 3rd Sunday of the month on the Rehab Unit at [facility], [place] ( no meetings June, July & Aug).  STROKE EDUCATION PROVIDED/REVIEWED AND GIVEN TO PATIENT Stroke warning signs and symptoms How to activate emergency medical system (call 911). Medications prescribed at discharge. Need for follow-up after discharge. Personal risk factors for stroke. Pneumonia vaccine given:   {STROKE DC YES/NO/DATE:22363} Flu vaccine given:   {STROKE DC YES/NO/DATE:22363} My questions have been answered, the writing is legible, and I understand these instructions.  I will adhere to these goals & educational materials that have been  provided to me after my discharge from the hospital.

## 2011-03-25 NOTE — Progress Notes (Signed)
03/25/2011 [facility], [facility] Clos [facility] Case Management Note [phone]    CARE MANAGEMENT NOTE 03/25/2011  Patient:  Shelia Webb, Shelia Webb   Account Number:  [id number]  Date Initiated:  03/25/2011  Documentation initiated by:  Fransico Michael  Subjective/Objective Assessment:   admitted on 03/22/11 with c/o difficulty swallowing and speech issues     Action/Plan:   prior to admission, patient was a resident at [facility].   Anticipated DC Date:  03/25/2011   Anticipated DC Plan:  SKILLED NURSING FACILITY      DC Planning Services  CM consult      Choice offered to / List presented to:             Status of service:  Completed, signed off Medicare Important Message given?   (If response is "NO", the following Medicare IM given date fields will be blank) Date Medicare IM given:   Date Additional Medicare IM given:    Discharge Disposition:  SKILLED NURSING FACILITY  Per UR Regulation:  Reviewed for med. necessity/level of care/duration of stay  Comments:  03/25/11- 1132-J.Derotha Fishbaugh,RN,BSN  [phone]      Patient is to be discharged back to [facility] today.No further discharge needs noted.

## 2011-03-25 NOTE — Progress Notes (Signed)
Chart reviewed.  Pt. To be discharged back to SNF today.  Will defer OT eval to SNF.  If discharge plan changes, will initiate eval.  Jeani Hawking, OTR/L [phone]

## 2011-03-25 NOTE — Discharge Summary (Signed)
Admit date: 03/22/2011 Discharge date: 03/25/2011  Primary Care Physician:  Terald Sleeper, MD, MD   Discharge Diagnoses:   No resolved problems to display.  Active Hospital Problems  Diagnoses Date Noted   . CVA (cerebral infarction) 03/22/2011   . Dysphagia 03/22/2011   . CAD 03/30/2008   . DM 03/30/2008   . HYPERTENSION 03/30/2008     Resolved Hospital Problems  Diagnoses Date Noted Date Resolved     DISCHARGE MEDICATION: Medication List  As of 03/25/2011  7:54 AM   STOP taking these medications         PRESCRIPTION MEDICATION      SYSTANE 0.4-0.3 % Gel         TAKE these medications         albuterol 90 MCG/ACT inhaler   Commonly known as: PROVENTIL,VENTOLIN   Inhale 2 puffs into the lungs every 6 (six) hours as needed for wheezing.      calcium-vitamin D 500-200 MG-UNIT per tablet   Commonly known as: OSCAL WITH D   Take 1 tablet by mouth daily.      ciprofloxacin 250 MG tablet   Commonly known as: CIPRO   Take 1 tablet (250 mg total) by mouth 2 (two) times daily.      cycloSPORINE 0.05 % ophthalmic emulsion   Commonly known as: RESTASIS   Place 1 drop into both eyes 2 (two) times daily.      diclofenac sodium 1 % Gel   Commonly known as: VOLTAREN   Apply 1 application topically 4 (four) times daily as needed. For left knee pain          DULoxetine 60 MG capsule   Commonly known as: CYMBALTA   Take 60 mg by mouth daily.      Fluticasone-Salmeterol 100-50 MCG/DOSE Aepb   Commonly known as: ADVAIR   Inhale 1 puff into the lungs daily.      furosemide 20 MG tablet   Commonly known as: LASIX   Take 20 mg by mouth daily.      gabapentin 400 MG capsule   Commonly known as: NEURONTIN   Take 400 mg by mouth at bedtime.      insulin aspart 100 UNIT/ML injection   Commonly known as: novoLOG   Inject 10 Units into the skin 3 (three) times daily with meals.      insulin aspart 100 UNIT/ML injection   Commonly known as: novoLOG   Inject 6 Units  into the skin 3 (three) times daily with meals.      insulin glargine 100 UNIT/ML injection   Commonly known as: LANTUS   Inject 15 Units into the skin at bedtime.      insulin glargine 100 UNIT/ML injection   Commonly known as: LANTUS   Inject 20 Units into the skin daily.      LACRI-LUBE OP   Place 1 drop into both eyes at bedtime.      metoprolol succinate 25 MG 24 hr tablet   Commonly known as: TOPROL-XL   Take 25 mg by mouth daily.      multivitamins ther. w/minerals Tabs   Take 1 tablet by mouth daily.      omeprazole 20 MG capsule   Commonly known as: PRILOSEC   Take 20 mg by mouth daily.      oxyCODONE-acetaminophen 5-325 MG per tablet   Commonly known as: PERCOCET   Take 1 tablet by mouth every 6 (six) hours as needed. For pain  polyethylene glycol packet   Commonly known as: MIRALAX / GLYCOLAX   Take 17 g by mouth daily as needed. For constipation        predniSONE 10 MG tablet   Commonly known as: DELTASONE   Take 10 mg by mouth 2 (two) times daily. For 2 days  2/28 and 3/1      rOPINIRole 0.25 MG tablet   Commonly known as: REQUIP   Take 0.25 mg by mouth 2 (two) times daily.      senna 8.6 MG Tabs   Commonly known as: SENOKOT   Take 1 tablet by mouth at bedtime.      simvastatin 20 MG tablet   Commonly known as: ZOCOR   Take 20 mg by mouth at bedtime.      traZODone 50 MG tablet   Commonly known as: DESYREL   Take 25 mg by mouth at bedtime as needed. For insomnia      Vitamin D (Ergocalciferol) 50000 UNITS Caps   Commonly known as: DRISDOL   Take 50,000 Units by mouth every 7 (seven) days. mondays              Consults:     SIGNIFICANT DIAGNOSTIC STUDIES:  Dg Chest 1 View  03/22/2011  *RADIOLOGY REPORT*  Clinical Data: Short of breath  CHEST - 1 VIEW  Comparison: 02/15/2011  Findings: Mild cardiomegaly.  Normal vascularity.  Thorax is rotated to the right limiting the examination.  Lungs are grossly clear.  No pneumothorax.   IMPRESSION: Cardiomegaly without edema.  Original Report Authenticated By: Donavan Burnet, M.D.   Ct Head Wo Contrast  03/24/2011  *RADIOLOGY REPORT*  Clinical Data: Unresponsive, previous stroke  CT HEAD WITHOUT CONTRAST  Technique:  Contiguous axial images were obtained from the base of the skull through the vertex without contrast.  Comparison: 03/22/2011  Findings: Chronic bilateral maxillary sinus disease. Atherosclerotic and physiologic intracranial calcifications. Progressive evolution of a non hemorrhagic subacute infarct involving the right basal ganglia and posterior limb right internal capsule.  No acute intracranial hemorrhage, midline shift, mass, mass effect.  Acute infarct may be inapparent on noncontrast CT. Mild diffuse parenchymal atrophy with resultant prominence of ventricles and sulci as before. Bone windows demonstrate no acute calvarial lesion.  IMPRESSION:  Some interval evolution of right basal ganglia/internal capsule non hemorrhagic infarct.  No acute or superimposed abnormality.  Original Report Authenticated By: Osa Craver, M.D.   Ct Head Wo Contrast  03/22/2011  *RADIOLOGY REPORT*  Clinical Data: Dysphagia.  CT HEAD WITHOUT CONTRAST  Technique:  Contiguous axial images were obtained from the base of the skull through the vertex without contrast.  Comparison: 02/15/2011  Findings: There is an infarct in the right basal ganglia, new since prior study. There is atrophy and chronic small vessel disease changes.  No hydrocephalus.  No hemorrhage.  Sinusitis changes in the maxillary sinuses, somewhat improved since prior study.  Mastoids are clear.  IMPRESSION: Likely subacute infarct in the right basal ganglia, new since prior study.  Atrophy, chronic small vessel disease.  Original Report Authenticated By: Cyndie Chime, M.D.   Mr Maxine Glenn Head Wo Contrast  03/22/2011  *RADIOLOGY REPORT*  Clinical Data:  Stroke.  Dysphagia and slurred speech.  MRI HEAD WITHOUT CONTRAST MRA HEAD  WITHOUT CONTRAST  Technique:  Multiplanar, multiecho pulse sequences of the brain and surrounding structures were obtained without intravenous contrast. Angiographic images of the head were obtained using MRA technique without contrast.  Comparison:  CT 03/22/2011  MRI HEAD  Findings:  Acute infarct posterior limb internal capsule on the right.  No other acute infarcts.  Generalized atrophy mild chronic microvascular ischemia in the white matter. Chronic ischemic changes in the thalami bilaterally. The brainstem is intact.  Negative for hemorrhage or mass lesion.  Chronic sinusitis with mucosal edema in the maxillary sinus bilaterally.  IMPRESSION: Acute infarct posterior limb internal capsule on the right.  Atrophy and mild chronic microvascular ischemia.  MRA HEAD  Findings: Image quality degraded by artifact.  Tortuosity of the distal vertebral arteries and basilar causes decreased signal.  There is decreased signal in the proximal basilar making it difficult to evaluate.  The vertebral arteries and basilar are patent.  Superior cerebellar and posterior cerebral arteries are patent bilaterally.  Internal carotid artery is patent bilaterally without stenosis. Anterior and middle cerebral arteries are patent bilaterally without stenosis.  Negative for cerebral aneurysm.  IMPRESSION: Distal vertebral arteries and proximal basilar are not adequately evaluated due to artifact.  Otherwise negative.  Original Report Authenticated By: Camelia Phenes, M.D.   Mr Brain Wo Contrast  03/22/2011  *RADIOLOGY REPORT*  Clinical Data:  Stroke.  Dysphagia and slurred speech.  MRI HEAD WITHOUT CONTRAST MRA HEAD WITHOUT CONTRAST  Technique:  Multiplanar, multiecho pulse sequences of the brain and surrounding structures were obtained without intravenous contrast. Angiographic images of the head were obtained using MRA technique without contrast.  Comparison:  CT 03/22/2011  MRI HEAD  Findings:  Acute infarct posterior limb internal  capsule on the right.  No other acute infarcts.  Generalized atrophy mild chronic microvascular ischemia in the white matter. Chronic ischemic changes in the thalami bilaterally. The brainstem is intact.  Negative for hemorrhage or mass lesion.  Chronic sinusitis with mucosal edema in the maxillary sinus bilaterally.  IMPRESSION: Acute infarct posterior limb internal capsule on the right.  Atrophy and mild chronic microvascular ischemia.  MRA HEAD  Findings: Image quality degraded by artifact.  Tortuosity of the distal vertebral arteries and basilar causes decreased signal.  There is decreased signal in the proximal basilar making it difficult to evaluate.  The vertebral arteries and basilar are patent.  Superior cerebellar and posterior cerebral arteries are patent bilaterally.  Internal carotid artery is patent bilaterally without stenosis. Anterior and middle cerebral arteries are patent bilaterally without stenosis.  Negative for cerebral aneurysm.  IMPRESSION: Distal vertebral arteries and proximal basilar are not adequately evaluated due to artifact.  Otherwise negative.  Original Report Authenticated By: Camelia Phenes, M.D.   Dg Swallowing Func-no Report  03/24/2011  CLINICAL DATA: aspiration   FLUOROSCOPY FOR SWALLOWING FUNCTION STUDY:  Fluoroscopy was provided for swallowing function study, which was  administered by a speech pathologist.  Final results and recommendations  from this study are contained within the speech pathology report.        Recent Results (from the past 240 hour(s))  MRSA PCR SCREENING     Status: Normal   Collection Time   03/22/11 11:45 PM      Component Value Range Status Comment   MRSA by PCR NEGATIVE  NEGATIVE  Final     BRIEF ADMITTING H & P: 78 year old Caucasian female with a past medical history of a recent TIA, history of atrial fibrillation not on chronic Coumadin therapy, hypertension, diabetes, bed to wheelchair bound brought in for evaluation of the  above-noted complaints. In the ED patient underwent a CT scan of the head which showed a subacute infarct in  the right basal ganglia area. She subsequently underwent a MRI of the brain which showed acute infarct in the posterior limb of the internal capsule on the right side. Patient currently is able to speak, speech is slow but mostly clear. She is now being admitted to the hospitalist service for further evaluation and treatment.  No resolved problems to display.  Active Hospital Problems  Diagnoses Date Noted   . CVA (cerebral infarction): MRI done with results as above. Neurology was consulted. They recommended to continue ASA. MRA of the head showed No ICA stenosis. Swallowing evaluation was done and recommended to proceed with dysphagia 1 diet consistency only as patient observed to tolerate puree consistency in small amounts. PT recommended SNF.  For her Paroxysmal a. fib she is a poor candidate for coumadin this was discuss with the son and he agrees. She will continue on ASA 325 mg.  03/22/2011   . Dysphagia; See above. 03/22/2011   . CAD: no change continue home meds. 03/30/2008   . DM: Controlled no changes. 03/30/2008   . HYPERTENSION: Controlled no changes. 03/30/2008     Resolved Hospital Problems  Diagnoses Date Noted Date Resolved     Disposition and Follow-up:  Follow up with PCP in 2 weeks. Discharge Orders    Future Orders Please Complete By Expires   Diet - low sodium heart healthy      Increase activity slowly        Follow-up Information    Follow up with Terald Sleeper, MD .          DISCHARGE EXAM:  General: Alert, awake, oriented x2, in no acute distress.  HEENT: No bruits, no goiter.  Heart: Regular rate and rhythm, without murmurs, rubs, gallops.  Lungs: Crackles left side, bilateral air movement.  Abdomen: Soft, nontender, nondistended, positive bowel sounds.  Neuro: Grossly intact, nonfocal.   Blood pressure 123/72, pulse 75,  temperature 98.4 F (36.9 C), temperature source Oral, resp. rate 19, height 5' (1.524 m), weight 71.201 kg (156 lb 15.5 oz), SpO2 98.00%.   Basename 03/22/11 2059 03/22/11 1601  NA -- 135  K -- 3.7  CL -- 98  CO2 -- 26  GLUCOSE -- 203*  BUN -- 23  CREATININE 0.66 0.69  CALCIUM -- 9.6  MG -- --  PHOS -- --    Basename 03/22/11 1601  AST 19  ALT 22  ALKPHOS 106  BILITOT 0.3  PROT 7.6  ALBUMIN 3.5   No results found for this basename: LIPASE:2,AMYLASE:2 in the last 72 hours  Basename 03/22/11 2059 03/22/11 1601  WBC 12.2* 12.4*  NEUTROABS -- 7.9*  HGB 14.8 14.7  HCT 43.9 45.8  MCV 78.7 80.2  PLT 177 216    Signed: Marinda Elk M.D. 03/25/2011, 7:54 AM

## 2011-03-25 NOTE — Progress Notes (Signed)
Speech Language/Pathology Speech Language Pathology Swallow Treatment Patient Details Name: Shelia Webb MRN: [id number] DOB: 1921/03/31 Today's Date: 03/25/2011  SLP Assessment/Plan/Recommendation   Patient appears to be tolerating Dysphagia 1 (puree) with Honey thick liquids relatively well.  Recommend continued diet restrictions, and a repeat MBS prior to diet advancement.  Close monitoring with p.o.'s is recommended, to ensure that patient is following precautions and compensatory strategies.  Prognosis for advancing diet is guarded.      General  Patient required repeat education regarding MBS results, reason for restricted diet, and need for 2 swallows after each bite/sip, but was able to verbalize (although not consistently demonstrate) the above.    Patient took several sips, with one slight cough noted during the session.  LS remain clear and patient is afebrile.           Maryjo Rochester T 03/25/2011, 3:35 PM

## 2011-03-25 NOTE — Progress Notes (Signed)
Clinical Social Worker completed psychosocial assessment and placed in shadow chart. Pt is from [facility] and plans to return at discharge. Pt requested this Clinical Social Worker contact pt son to discuss discharge plan. Clinical Social Worker contacted pt son and left a message. Clinical Social Worker spoke with [facility] and confirmed pt can return at discharge. Per MD, pt medically stable for discharge today. Clinical Social Worker to facilitate pt discharge needs.  Jacklynn Lewis, MSW, LCSWA  Clinical Social Work [phone]

## 2011-03-25 NOTE — Progress Notes (Signed)
History: Shelia Webb is an 78 y.o. female who resides at nursing home. Although she was known to not be her baseline she was not brought to the hospital until today. Due to her weakness on her left side not improving for one week she was brought to the ED. CT head suggests right subacute BG infarct. Has UTI.  Subjective: The patient is alert and cooperative. The patient ate her complete breakfast. The patient does not have any significant complaints at this time. Patient wants her diet liberalized, and does not like the pure diet.  Objective: BP 123/72  Pulse 75  Temp(Src) 98.4 F (36.9 C) (Oral)  Resp 19  Ht 5' (1.524 m)  Wt 71.201 kg (156 lb 15.5 oz)  BMI 30.66 kg/m2  SpO2 98%  CBGs  Basename 03/25/11 0637 03/24/11 2123 03/24/11 1632 03/24/11 1248 03/23/11 2202 03/23/11 1623 03/23/11 1132 03/23/11 0654 03/22/11 2324  GLUCAP 225* 185* 290* 252* 290* 226* 209* 119* 124*   Diet: D1, honey thick Activity: up with assistance DVT prophylaxis: lovenox   Medications: Scheduled:    . calcium-vitamin D  1 tablet Oral Daily  . ciprofloxacin  400 mg Intravenous Q12H  . cycloSPORINE  1 drop Both Eyes BID  . DULoxetine  60 mg Oral Daily  . enoxaparin  40 mg Subcutaneous Q24H  . Fluticasone-Salmeterol  1 puff Inhalation Daily  . insulin aspart  0-5 Units Subcutaneous QHS  . insulin aspart  0-9 Units Subcutaneous TID WC  . mulitivitamin with minerals  1 tablet Oral Daily  . pantoprazole  40 mg Oral Q1200  . polyvinyl alcohol  1 drop Both Eyes QID  . predniSONE  10 mg Oral BID  . rOPINIRole  0.25 mg Oral BID  . senna  1 tablet Oral QHS  . Vitamin D (Ergocalciferol)  50,000 Units Oral Q7 days  . DISCONTD: aspirin  300 mg Rectal Daily  . DISCONTD: aspirin  325 mg Oral Daily  . DISCONTD: furosemide  20 mg Oral Daily  . DISCONTD: gabapentin  400 mg Oral QHS  . DISCONTD: insulin aspart  0-9 Units Subcutaneous TID WC  . DISCONTD: insulin aspart  0-9 Units Subcutaneous Q4H  . DISCONTD:  insulin glargine  15 Units Subcutaneous QHS  . DISCONTD: simvastatin  20 mg Oral QHS    Exam: Lungs: CTA anteriorly Cor RRR. Abd: soft, +BS, minimal material in colotsomy bag.  Ostomy site appears WNL. Mental Status: Alert, oriented to place this am.  Answers questions appropriately, soft voice. Cranial Nerves: II- R inferolateral visual field cut. Cannot reproduce diploplia with both eyes open, but pt prefers to keep R eye closed.  III/IV/VI-Extraocular movements intact, with the exception of severe superior gaze deficits. Pupils reactive bilaterally.  V/VII-Smile symmetric The patient has a flat affect VIII-hearing grossly intact  IX/X-normal gag  XI-bilateral shoulder shrug  XII-midline tongue extension  Motor: 5/5 bilaterally , stiff LE due to extensive arthritis and joint deformity L knee.  (Non-ambulatory x 3 yrs) Sensory: Light touch intact throughout, bilaterally  Deep Tendon Reflexes: 2+ UE.   Plantars: Downgoing bilaterally  Cerebellar: Normal finger-to-nose.  Lab Results: CBC:  Lab 03/22/11 2059 03/22/11 1601  WBC 12.2* 12.4*  NEUTROABS -- 7.9*  HGB 14.8 14.7  HCT 43.9 45.8  MCV 78.7 80.2  PLT 177 216   Basic Metabolic Panel:  Lab 03/22/11 4098 03/22/11 1601  NA -- 135  K -- 3.7  CL -- 98  CO2 -- 26  GLUCOSE -- 203*  BUN -- 23  CREATININE 0.66 0.69  CALCIUM -- 9.6  MG -- --  PHOS -- --   Liver Function Tests:  Lab 03/22/11 1601  AST 19  ALT 22  ALKPHOS 106  BILITOT 0.3  PROT 7.6  ALBUMIN 3.5   Hemoglobin A1C:  Lab 03/22/11 2346  HGBA1C 11.3*   Fasting Lipid Panel:  Lab 03/23/11 0700  CHOL 140  HDL 51  LDLCALC 47  TRIG 209*  CHOLHDL 2.7  LDLDIRECT --   Study Results:  03/24/11 CT HEAD STAT: Chronic bilateral maxillary sinus disease. Atherosclerotic and physiologic intracranial calcifications. Progressive evolution of a non hemorrhagic subacute infarct involving the right basal ganglia and posterior limb right internal capsule. No  acute intracranial hemorrhage, midline shift, mass, mass effect. Acute infarct may be inapparent on noncontrast CT. Mild diffuse parenchymal atrophy with resultant prominence of ventricles and sulci as before. Bone windows demonstrate no acute calvarial lesion. IMPRESSION: Some interval evolution of right basal ganglia/internal capsule non hemorrhagic infarct. No acute or superimposed abnormality.  03/24/11 Modified Baruim Swallow: Recommend to continue dysphagia 1 diet consistency with honey thick liquids by cup. Recommend swallow treatment at next level of care. Repeat MBS before upgrading diet secondary to noted silent aspiration with nectar and thin liquids.  03/22/2011   CT HEAD WITHOUT CONTRAST Findings: There is an infarct in the right basal ganglia, new since prior study. There is atrophy and chronic small vessel disease changes.  No hydrocephalus.  No hemorrhage.  Sinusitis changes in the maxillary sinuses, somewhat improved since prior study.  Mastoids are clear.  IMPRESSION: Likely subacute infarct in the right basal ganglia, new since prior study.  Atrophy, chronic small vessel disease. Cyndie Chime, M.D.  . 03/22/2011   MRI HEAD WITHOUT CONTRAST  Findings:  Acute infarct posterior limb internal capsule on the right.  No other acute infarcts.  Generalized atrophy mild chronic microvascular ischemia in the white matter. Chronic ischemic changes in the thalami bilaterally. The brainstem is intact.  Negative for hemorrhage or mass lesion.  Chronic sinusitis with mucosal edema in the maxillary sinus bilaterally.   IMPRESSION: Acute infarct posterior limb internal capsule on the right.  Atrophy and mild chronic microvascular ischemia.  Camelia Phenes, M.D.   03/22/2011 MRA HEAD  Findings: Image quality degraded by artifact.  Tortuosity of the distal vertebral arteries and basilar causes decreased signal.  There is decreased signal in the proximal basilar making it difficult to evaluate.  The vertebral  arteries and basilar are patent.  Superior cerebellar and posterior cerebral arteries are patent bilaterally.  Internal carotid artery is patent bilaterally without stenosis. Anterior and middle cerebral arteries are patent bilaterally without stenosis.  Negative for cerebral aneurysm.  IMPRESSION: Distal vertebral arteries and proximal basilar are not adequately evaluated due to artifact.  Otherwise negative. Camelia Phenes, M.D.   02/18/11 TCD Summary: Highly limited suboptimal study due to poor windows throughout. Antegrade flow in right middle cerebral, left opthalmic and both vertebral arteries.  Pearlean Brownie, MD.  02/18/11 2D echo Left ventricle: There was moderate concentric hypertrophy. Systolic function was vigorous. The estimated ejection fraction was in the range of 65% to 70%.There is a mild to moderate left ventricular outflow tract gradient of peak. Doppler parameters are consistent with abnormal left ventricular relaxation (grade 1 diastolic dysfunction). Doppler parameters are consistent with high ventricular filling pressure. Impressions: No cardiac source of emboli was indentified.  02/16/11 Carotid Dopplers: no ICA stenosis.   Assessment/Plan: 78 yo nursing  home resident  1. Acute infarct right posterior limb internal capsule-ischemic, on ASA 325 mg. Repeat head CT 03/24/11 showed evolution of right basal ganglia/internal capsule non hemorrhagic infarct, but no extension or acute event.  Dysphagia requiring D1 honey thick liquids. 2. S/P TIA 1/13- treated with ASA 325 mg. 3. AFib- not on coumadin-History of atrial fibrillation: Reviewed her records in E chart and patient had a brief period of atrial fibrillation in June of 2011 when she had septic shock. She has however not been on any anticoagulation. She does have a CHADS 2 score probably of 5. 4. UTI- on rocephin 5. DM2, uncontrolled.  A1C 11.  Needs aggressive diabetes control. 6. HTN- controlled and at goal. 7. Hyperlipidemia-@  goal on Zocor.  LDL 47   8. PT and OT eval 9. Discharge planning, the patient may be discharged to the extended care facility today. The patient will require ongoing occupational therapy and speech therapy input. Clinically, the patient could potentially have akinetic parkinsonism. A trial on Sinemet in the future may not be unreasonable.  Case discussed with Dr. Anne Hahn who agrees with above plan.  The patient may followup through our office in the next 6-8 weeks.   LOS: 3 days   Marya Fossa PA-C [facility] [phone] 03/25/2011  8:42 AM   Lesly Dukes

## 2011-03-25 NOTE — Progress Notes (Signed)
Clinical Social Worker facilitated pt discharge needs including contacting facility, family, and arranging ambulance transportation for pt back to [facility]. No further social work needs at this time. Clinical Social Worker signing off.   Jacklynn Lewis, MSW, LCSWA  Clinical Social Work [phone]

## 2011-03-25 NOTE — Progress Notes (Signed)
Inpatient Diabetes Program Recommendations  AACE/ADA: New Consensus Statement on Inpatient Glycemic Control (2009)  Target Ranges:  Prepandial:   less than 140 mg/dL      Peak postprandial:   less than 180 mg/dL (1-2 hours)      Critically ill patients:  140 - 180 mg/dL   Reason for assessment:  CBGs >200  Inpatient Diabetes Program Recommendations Insulin - Basal: start Lantus 15 units (home dose per med rec)  Note: Patient is currently only scheduled for Novolog SSI/correction.  Recommend starting his home dose Lantus.  Thank you  Piedad Climes RN,BSN,CDE Inpatient Diabetes Coordinator

## 2011-03-25 NOTE — Progress Notes (Signed)
Brief Nutrition Note: Pt admitted with difficulty swallowing. Pt currently on Dysphagia 1/Honey thickened liquid diet. Meal completion 100%. 60 inches 156 pounds BMI: 30.7 Obesity Class I Lab Results  Component Value Date   HGBA1C 11.3* 03/22/2011  Per notes pt to d/c back to SNF today. Pt reports good appetite and that she is on the same diet at the SNF. Will defer any diet education to the RD at the SNF.  Kendell Bane Cornelison [phone]

## 2011-03-25 NOTE — Progress Notes (Signed)
Utilization review completed. Shelia Webb 03/25/2011 

## 2011-04-01 NOTE — ED Provider Notes (Signed)
Medical screening examination/treatment/procedure(s) were conducted as a shared visit with non-physician practitioner(s) and myself.  I personally evaluated the patient during the encounter.  Please see completed note for this encounter.  Raeford Razor, MD 04/01/11 [phone]

## 2011-04-26 IMAGING — CT CT ABD-PELV W/ CM
2 of 5 series · 13 of 32 positions shown, 18 images · IV contrast (water & 100ml omni 300)
Comparison: CT abdomen pelvis 09/08/2009.

CLINICAL DATA: Abdominal pain and distention.  Increased colostomy
output.

CT ABDOMEN AND PELVIS WITH CONTRAST
TECHNIQUE: Multidetector CT imaging of the abdomen and pelvis was
performed following the standard protocol during bolus
administration of intravenous contrast.
Contrast: 100 ml Omnipaque 300

[Series 2: routine abdomen · axial · 0.98mm/px · z∈[-356,-76]mm · 5 of 84 slices shown, 10 images]
[im 14/84  soft-tissue]
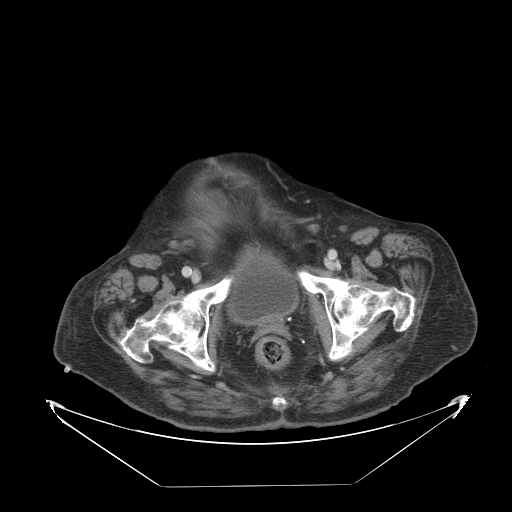
[im 14/84  bone]
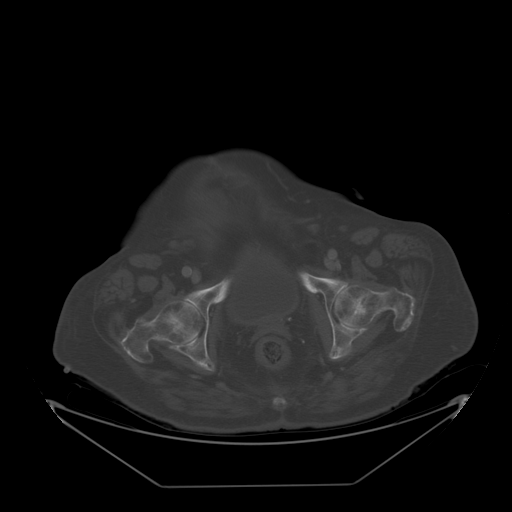
[im 28/84  soft-tissue]
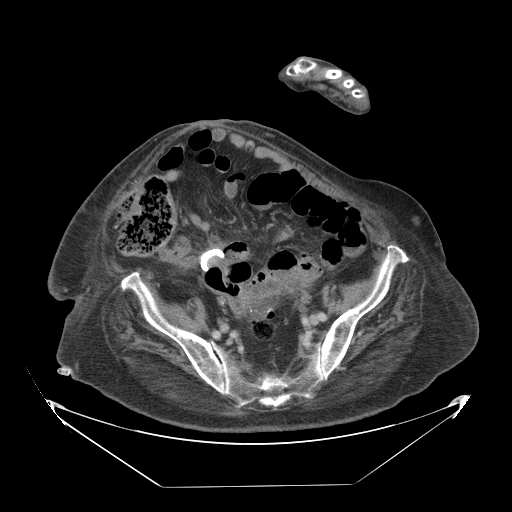
[im 28/84  lung]
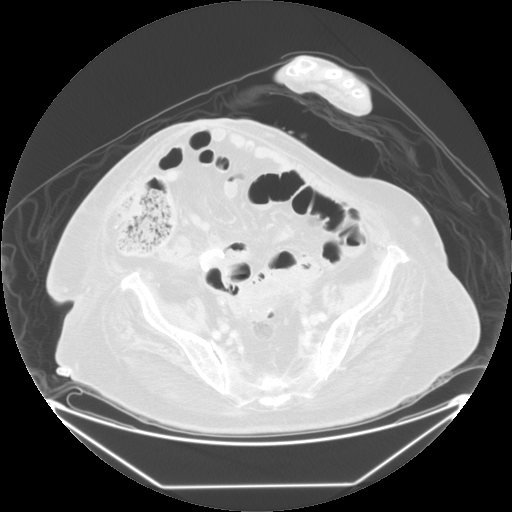
[im 42/84  soft-tissue]
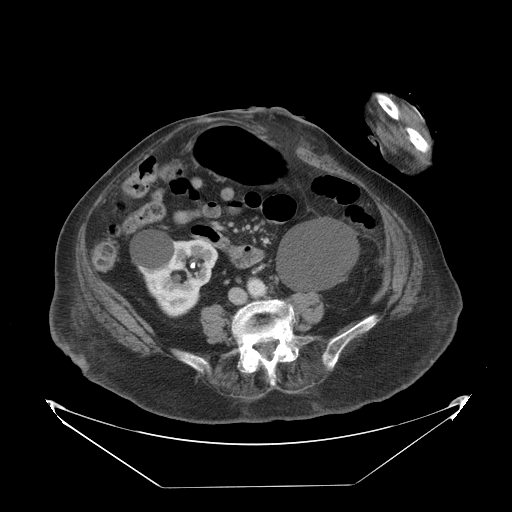
[im 42/84  lung]
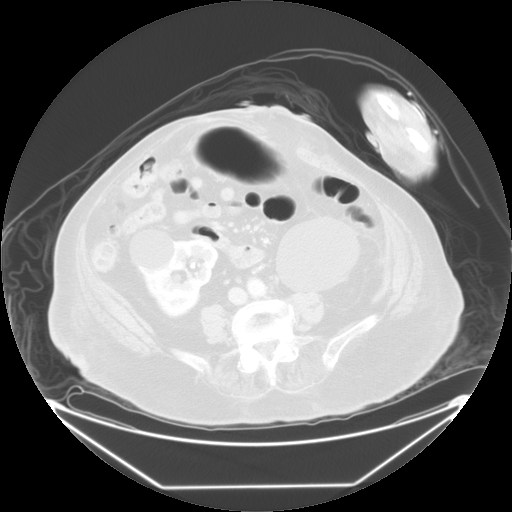
[im 56/84  soft-tissue]
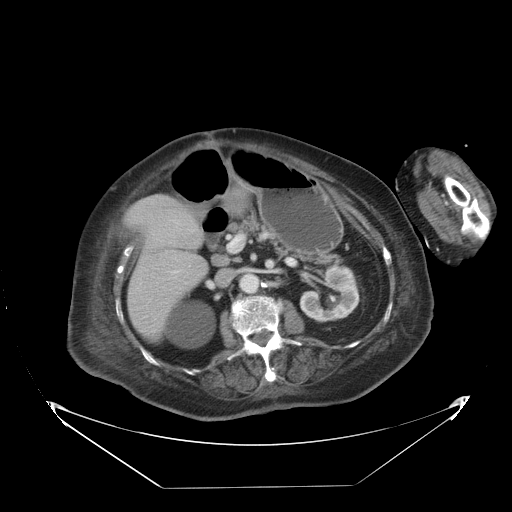
[im 56/84  lung]
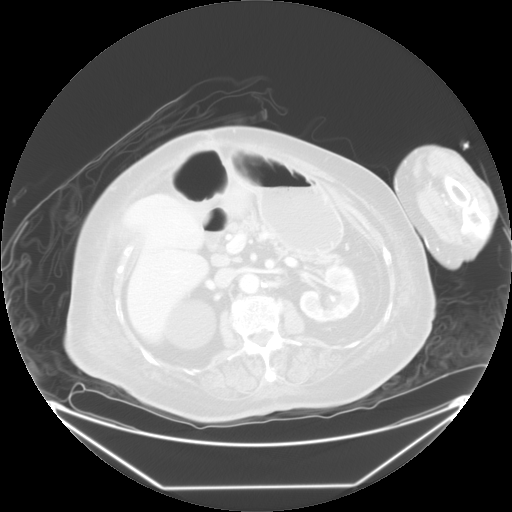
[im 70/84  soft-tissue]
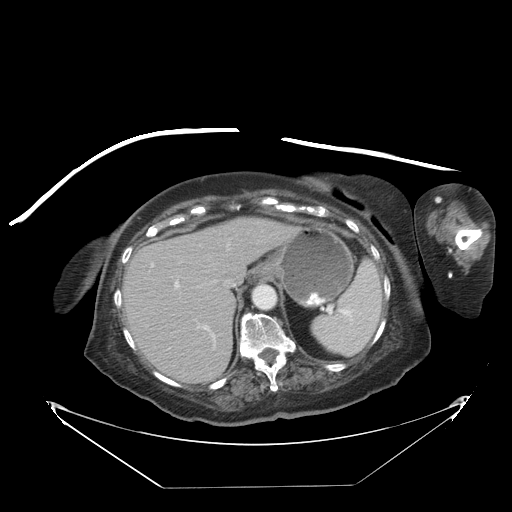
[im 70/84  lung]
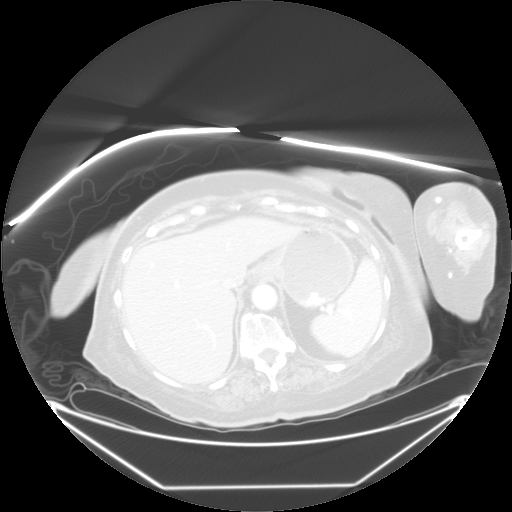

[Series 400: reformatted · sagittal · 0.98mm/px · 8 of 124 slices shown]
[im 13/124  soft-tissue]
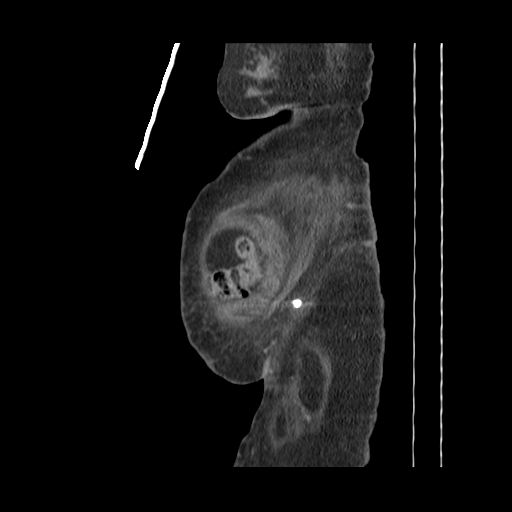
[im 25/124  soft-tissue]
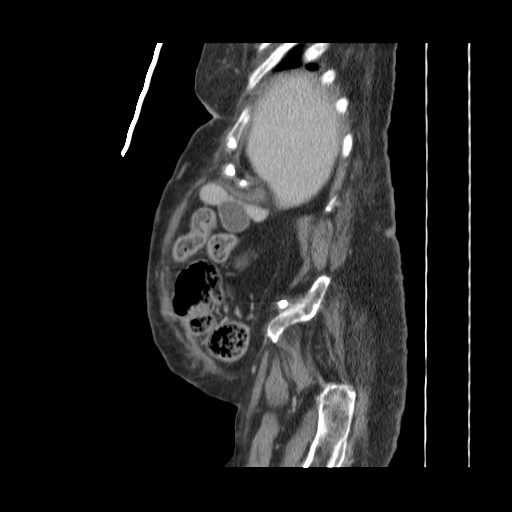
[im 37/124  soft-tissue]
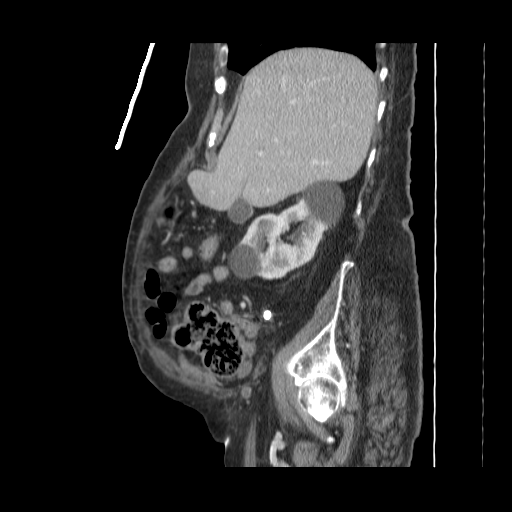
[im 50/124  soft-tissue]
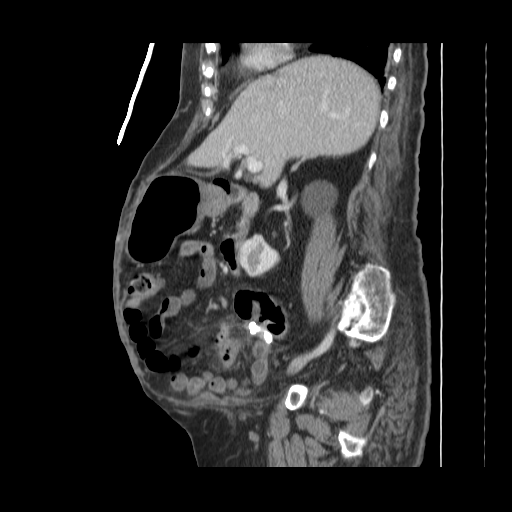
[im 74/124  soft-tissue]
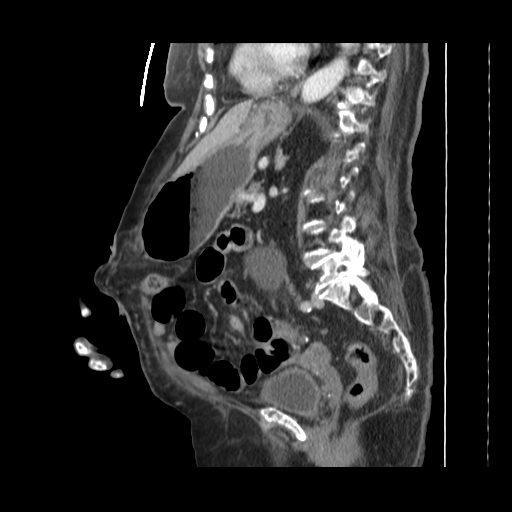
[im 87/124  soft-tissue]
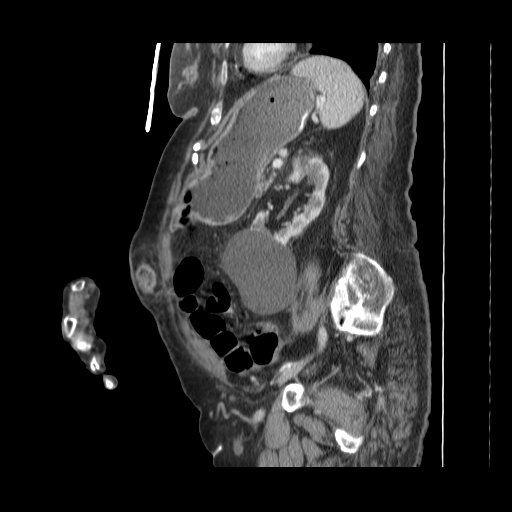
[im 99/124  soft-tissue]
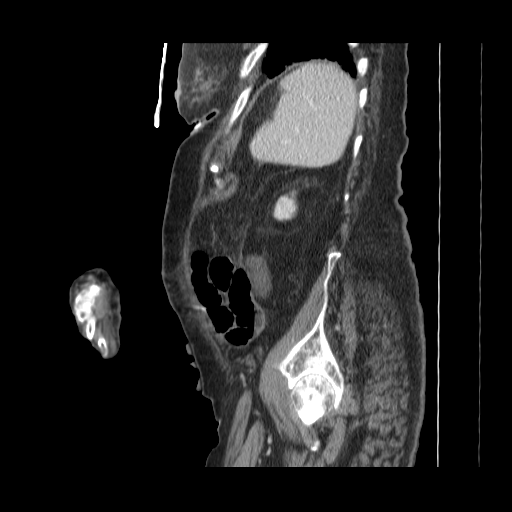
[im 111/124  soft-tissue]
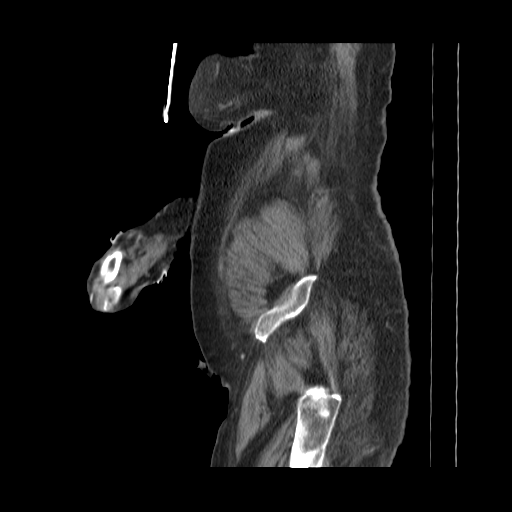

[13 of 32 positions shown; findings below may reference images not displayed]

FINDINGS: The lung bases are clear.  The heart is enlarged.  No
significant pleural or pericardial effusion is present.

The liver and spleen are within normal limits.  The stomach is
mildly distended.  A duodenal diverticulum is stable.  Diffuse
fatty infiltration of the pancreas is again noted.  The common bile
duct and gallbladder are within normal limits for age.  Multiple
bilateral renal cysts are again noted.  The ureters are within
normal limits bilaterally.  Atherosclerotic calcifications are
noted in the aorta and branch vessels without aneurysm.

A right lateral drainage catheter is stable position.  There is no
significant residual abscess about the catheter.  The previously
seen fluid collections have essentially resolved.  There is some
thickening along the base of the mesentery left of the tail of the
right-sided drainage catheter were a fluid collection was
previously seen.

The residual rectum is within normal limits.  The remaining right
colon and colostomy are unremarkable.  The appendix is not
distinctly visualized and may be surgically absent.  The small
bowel is unremarkable.  Minimal inflammatory changes remain about
the site of the colonic resection.  This is decreased relative to
the prior study.  The urinary bladder is within normal limits.

Bone windows demonstrate moderate degenerative change within the
lumbar spine.  Fusion across multiple anterior thoracic vertebral
bodies is compatible with DISH.  Degenerative changes are noted in
the hips bilaterally.
IMPRESSION: 1.  Near complete resolution of fluid collections within the
abdomen.
2.  Some residual edematous changes within the mesentery are
significantly improved.
3.  Mild gastric distention without focal etiology.
4.  Stable bilateral renal cystic lesions.
5.  Stable atherosclerosis.
6.  Stable degenerative changes of the thoracolumbar spine.

## 2011-06-26 ENCOUNTER — Ambulatory Visit (HOSPITAL_COMMUNITY)
Admission: RE | Admit: 2011-06-26 | Discharge: 2011-06-26 | Disposition: A | Discharge status: Home / Self Care | Payer: Medicare Other | Source: Ambulatory Visit | Attending: Internal Medicine | Admitting: Internal Medicine

## 2011-06-26 DIAGNOSIS — I639 Cerebral infarction, unspecified: Secondary | ICD-10-CM

## 2011-06-26 DIAGNOSIS — I6350 Cerebral infarction due to unspecified occlusion or stenosis of unspecified cerebral artery: Secondary | ICD-10-CM | POA: Insufficient documentation

## 2011-06-26 DIAGNOSIS — R1310 Dysphagia, unspecified: Secondary | ICD-10-CM | POA: Insufficient documentation

## 2011-06-26 NOTE — Procedures (Signed)
Objective Swallowing Evaluation: Modified Barium Swallowing Study  Patient Details  Name: Shelia Webb MRN: [id number] Date of Birth: 08-28-1921  Today's Date: 06/26/2011 Time: 4098-1191 SLP Time Calculation (min): 20 min  Past Medical History:  Past Medical History  Diagnosis Date  . Hypertension   . Diabetes mellitus   . Dysrhythmia     a-fib  . Anemia   . Acute respiratory failure   . Chronic kidney disease     Kidney failure  . Allergic rhinitis   . Osteoarthrosis   . Osteoporosis   . Anxiety   . Depression   . Restless leg syndrome   . GERD (gastroesophageal reflux disease)   . Hypertonic bladder   . Peripheral vascular disease   . Coronary atherosclerosis due to lipid rich plaque   . DNR (do not resuscitate)    Past Surgical History:  Past Surgical History  Procedure Date  . Colon surgery     has colostomy  . Abdominal hysterectomy   . Appendectomy   . Tonsillectomy   . Cardiac catheterization     bilateral cataract surgery  . Diaphragmatic hernia repair     with gangrene  . Orif patella 12/07/2010    Procedure: OPEN REDUCTION INTERNAL (ORIF) FIXATION PATELLA;  Surgeon: Nadara Mustard, MD;  Location: [facility] OR;  Service: Orthopedics;  Laterality: Left;  Left Knee Hamstring Release and Open Reduction Internal Fixation Patella  . Orif patella 12/21/2010    Procedure: OPEN REDUCTION INTERNAL (ORIF) FIXATION PATELLA;  Surgeon: Nadara Mustard, MD;  Location: [facility] OR;  Service: Orthopedics;  Laterality: Left;  Left Knee Hamstring Release and Open Reduction Internal Fixation Patella   HPI:  78 year old female resident of SNF with PMH significant for recent TIA, GERD, Acute respiratory failure, diaphragmatic hernia repair, and dysphagia. Patient with 4 prior MBS: April 2010, September 2011, and April of 2011. Results of objective evaluations indicate patient presents with primarily anatomically based pharyngeal phase dysphagia secondary to questionable C2 and C3 -5 impact into  posterior pharyngeal wall contributing to a narrow pharyngeal space. Diet recommendations from most currrent study was dysphagia 1 with honey thick liquid. Current OP MBS ordered to determine ability to advance diet.      Assessment / Plan / Recommendation Clinical Impression  Dysphagia Diagnosis: Mild pharyngeal phase dysphagia Clinical impression: Patient with overall improvement in swallowing function since previous MBS.  Patient now presents with a mild primarily sensory based oropharyngeal dysphagia characterized by delayed swallow initiation resulting in mild penetration and aspiration of thin liquids with slight weak throat clear in response.  Patient able to protect airway fully with solids and nectar thick liquids. Trace vallecular residuals post swallow clear with dry swallows.  Anatomical variation noted during previous MBS including appearance of impedance of  C3-C5  into posterior wall, narrowing pharynx, did not appear to significantly impact function at this time. Patient judged safe to advance to nectar thick liquids at this time. Wishes to remain on puree due to missing dentition.       Treatment Recommendation  Defer treatment plan to SLP at (Comment) (SNF)    Diet Recommendation Dysphagia 1 (Puree);Nectar-thick liquid   Liquid Administration via: Cup;No straw Medication Administration: Whole meds with puree Supervision: Patient able to self feed;Full supervision/cueing for compensatory strategies Compensations: Slow rate;Small sips/bites Postural Changes and/or Swallow Maneuvers: Seated upright 90 degrees    Other  Recommendations Oral Care Recommendations: Oral care BID Other Recommendations: Order thickener from pharmacy;Prohibited food (jello, ice  cream, thin soups);Remove water pitcher   Follow Up Recommendations  Skilled Nursing facility       Pertinent Vitals/Pain n/a    SLP Swallow Goals     General HPI: 78 year old female resident of SNF with PMH significant  for recent TIA, GERD, Acute respiratory failure, diaphragmatic hernia repair, and dysphagia. Patient with 4 prior MBS: April 2010, September 2011, and April of 2011. Results of objective evaluations indicate patient presents with primarily anatomically based pharyngeal phase dysphagia secondary to questionable C2 and C3 -5 impact into posterior pharyngeal wall contributing to a narrow pharyngeal space. Diet recommendations from most currrent study was dysphagia 1 with honey thick liquid. Current OP MBS ordered to determine ability to advance diet.  Type of Study: Modified Barium Swallowing Study Reason for Referral: Objectively evaluate swallowing function Previous Swallow Assessment: see HPI Diet Prior to this Study: Dysphagia 1 (puree);Honey-thick liquids Temperature Spikes Noted: No Respiratory Status: Room air History of Recent Intubation: No Behavior/Cognition: Cooperative;Alert;Pleasant mood;Requires cueing Oral Cavity - Dentition: Edentulous Oral Motor / Sensory Function: Within functional limits Self-Feeding Abilities: Able to feed self Patient Positioning: Upright in chair Baseline Vocal Quality: Clear Volitional Cough: Weak Volitional Swallow: Able to elicit Anatomy: Other (Comment) (see clinical impression statement) Pharyngeal Secretions: Not observed secondary MBS    Reason for Referral Objectively evaluate swallowing function   Ferdinand Lango MA, CCC-SLP [phone]           Tequisha Maahs Meryl 06/26/2011, 1:37 PM

## 2011-08-17 IMAGING — CR DG ABDOMEN ACUTE W/ 1V CHEST
3 series · 3 of 3 positions shown · non-contrast
Comparison: CT of 09/28/2009.  Plain film of 09/30/2009.

CLINICAL DATA: Bowel obstruction.  Colostomy.  Abdominal swelling.

ACUTE ABDOMEN SERIES (ABDOMEN 2 VIEW & CHEST 1 VIEW)

[w abdomen upright]
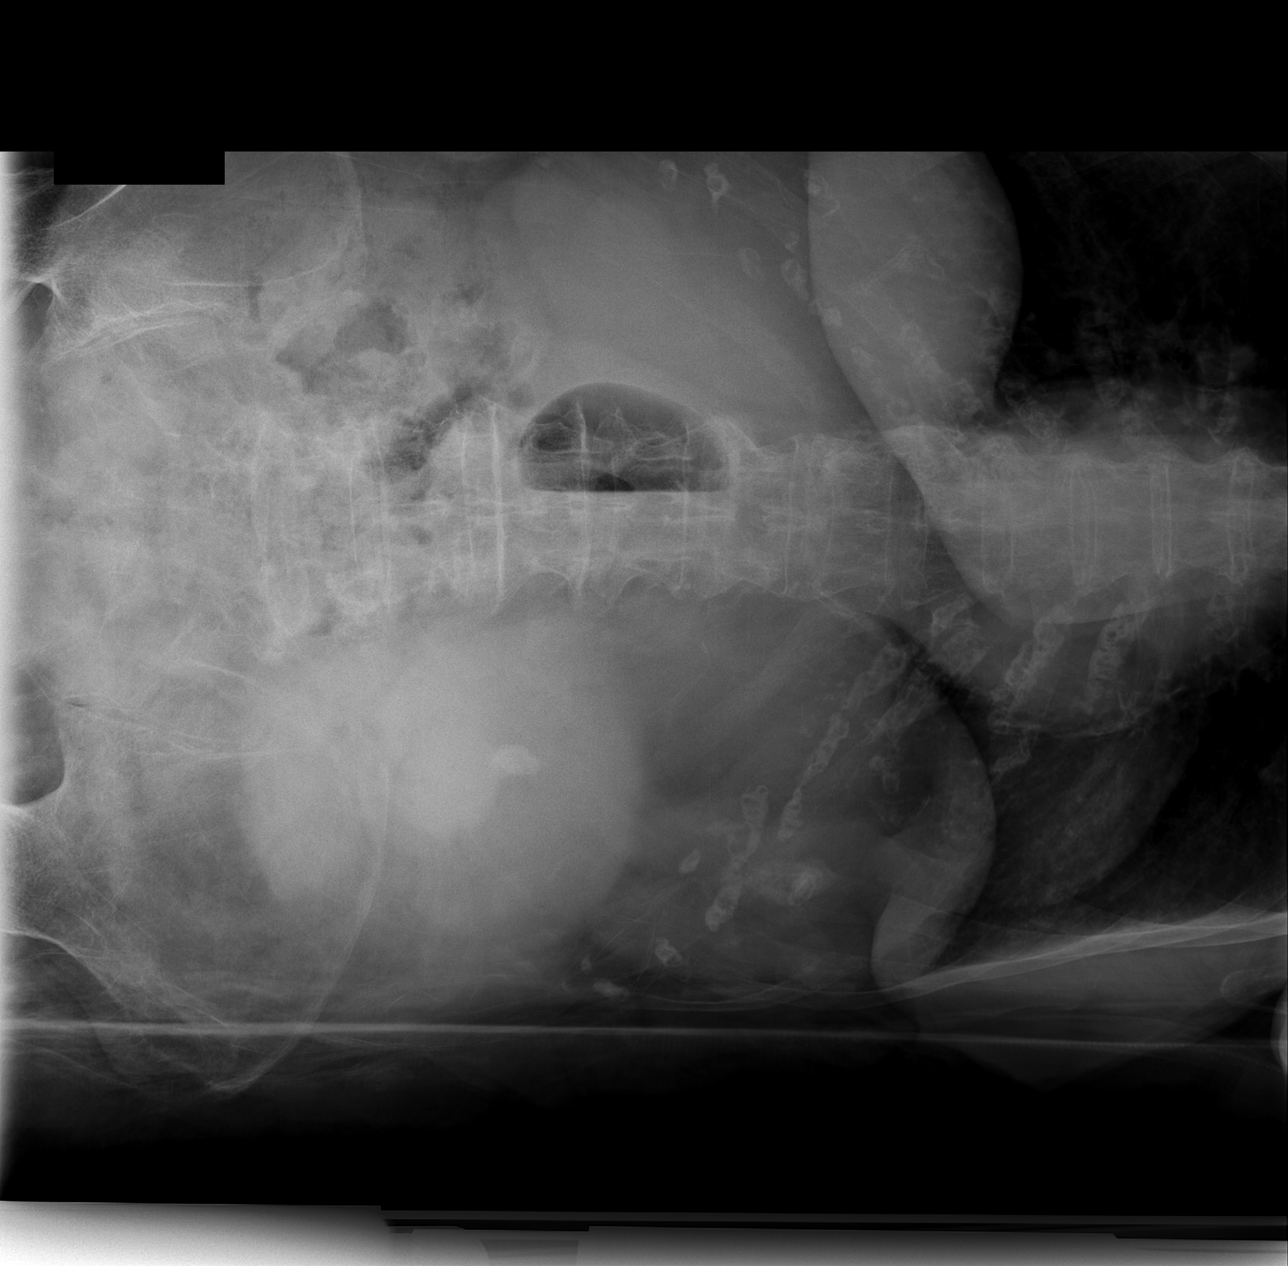

[t abdomen supine]
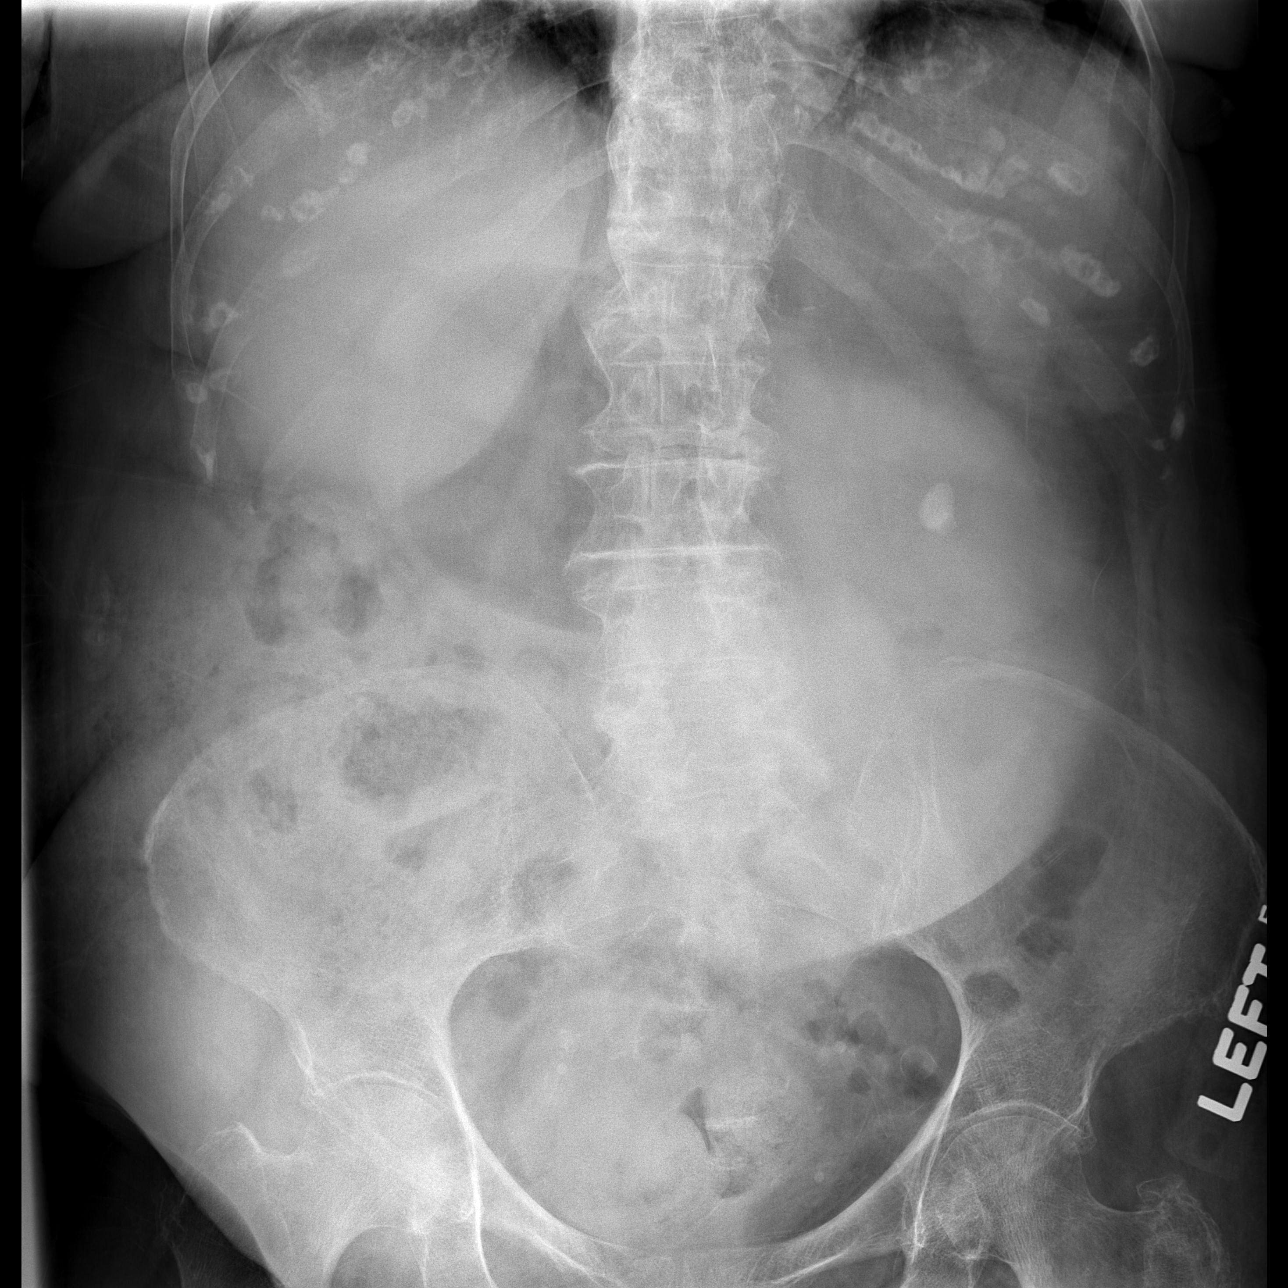

[t chest supine]
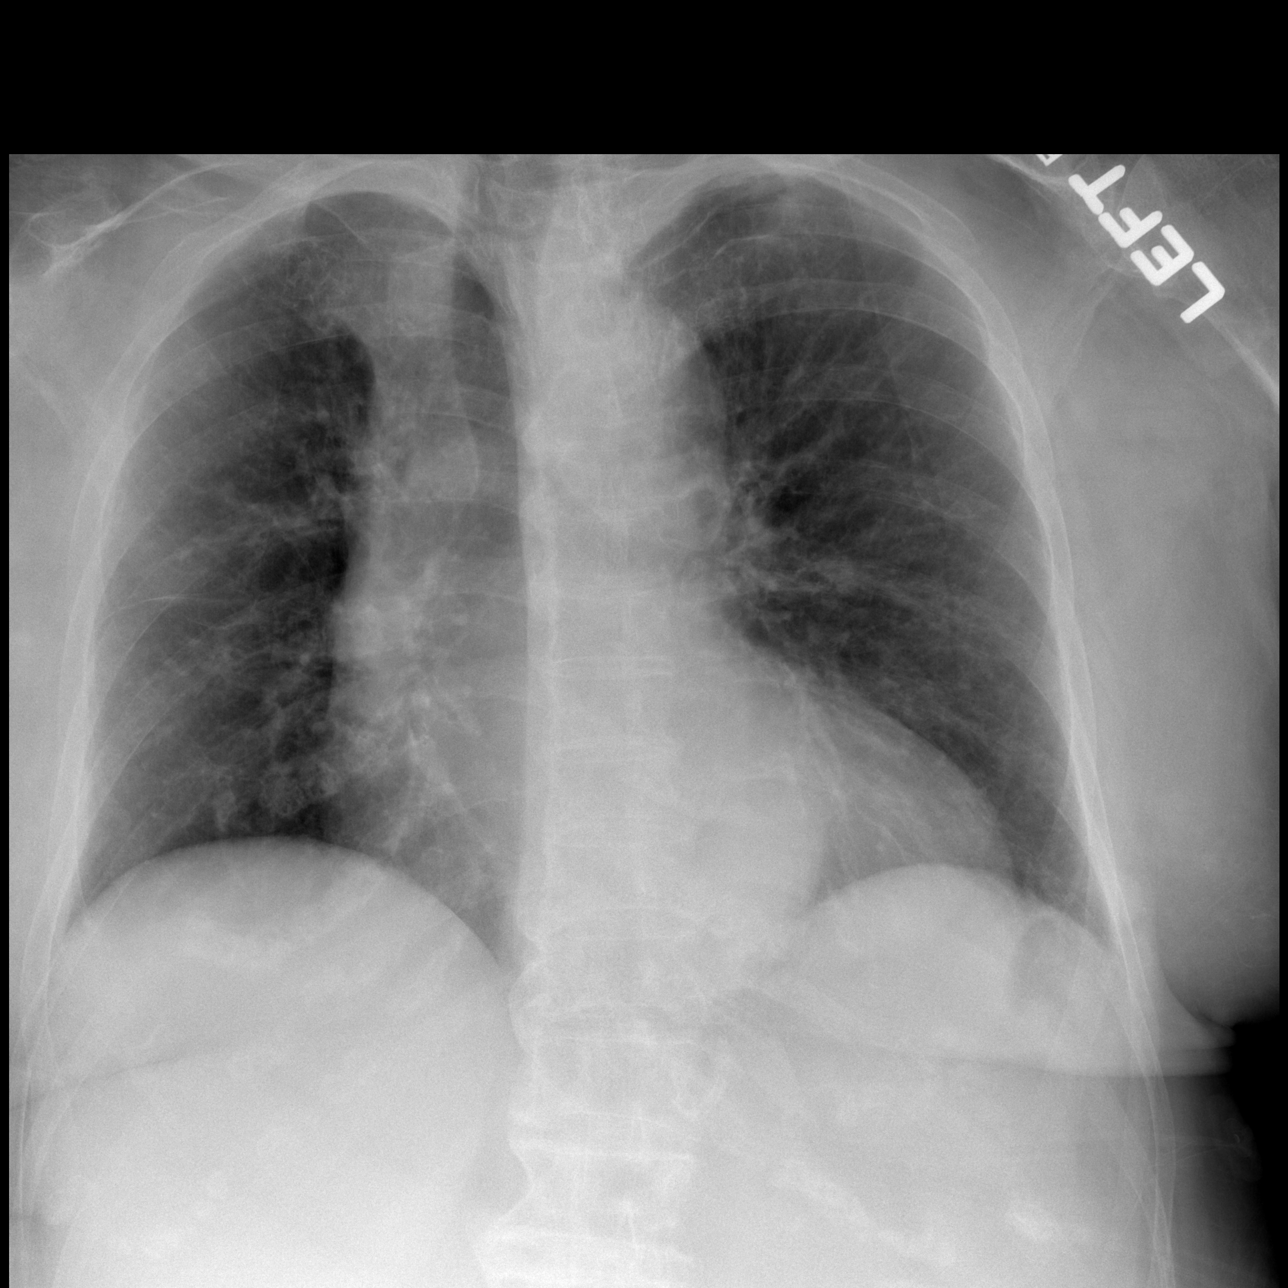

[3 of 3 positions shown; findings below may reference images not displayed]

FINDINGS: Frontal view of the chest demonstrates patient rotation
to the right.  Moderate cardiomegaly. No pleural effusion or
pneumothorax.  Clear lungs.

Abominal films demonstrate no free intraperitoneal air on right
side up decubitus imaging.  Single air fluid level in the central
abdomen, possible within the stomach.  Gas within normal caliber
colon and rectum.  Gas within  the right hemi abdomen could be
within the distal stomach or a single dilated small bowel loop.  A
left renal calculus measures 1.6 cm.  There is a moderate amount of
ascending colonic stool which could represent constipation.
IMPRESSION: 1.  Nonspecific bowel gas pattern.  A single air fluid level in the
central abdomen with a single prominent loop of small bowel versus
mildly dilated distal stomach.
2.  Probable constipation.
3.  Left renal calculus.
4.  Cardiomegaly without acute cardiopulmonary disease.

## 2012-03-13 ENCOUNTER — Encounter (HOSPITAL_COMMUNITY): Payer: Self-pay | Admitting: Emergency Medicine

## 2012-03-13 ENCOUNTER — Emergency Department (HOSPITAL_COMMUNITY)
Admission: EM | Admit: 2012-03-13 | Discharge: 2012-03-14 | Disposition: A | Discharge status: Home Health | Payer: Medicare Other | Attending: Emergency Medicine | Admitting: Emergency Medicine

## 2012-03-13 ENCOUNTER — Emergency Department (HOSPITAL_COMMUNITY): Payer: Medicare Other

## 2012-03-13 DIAGNOSIS — E119 Type 2 diabetes mellitus without complications: Secondary | ICD-10-CM | POA: Insufficient documentation

## 2012-03-13 DIAGNOSIS — Z79899 Other long term (current) drug therapy: Secondary | ICD-10-CM | POA: Insufficient documentation

## 2012-03-13 DIAGNOSIS — M1990 Unspecified osteoarthritis, unspecified site: Secondary | ICD-10-CM | POA: Insufficient documentation

## 2012-03-13 DIAGNOSIS — F3289 Other specified depressive episodes: Secondary | ICD-10-CM | POA: Insufficient documentation

## 2012-03-13 DIAGNOSIS — IMO0002 Reserved for concepts with insufficient information to code with codable children: Secondary | ICD-10-CM | POA: Insufficient documentation

## 2012-03-13 DIAGNOSIS — M810 Age-related osteoporosis without current pathological fracture: Secondary | ICD-10-CM | POA: Insufficient documentation

## 2012-03-13 DIAGNOSIS — I2510 Atherosclerotic heart disease of native coronary artery without angina pectoris: Secondary | ICD-10-CM | POA: Insufficient documentation

## 2012-03-13 DIAGNOSIS — J3489 Other specified disorders of nose and nasal sinuses: Secondary | ICD-10-CM | POA: Insufficient documentation

## 2012-03-13 DIAGNOSIS — Z87448 Personal history of other diseases of urinary system: Secondary | ICD-10-CM | POA: Insufficient documentation

## 2012-03-13 DIAGNOSIS — Z8679 Personal history of other diseases of the circulatory system: Secondary | ICD-10-CM | POA: Insufficient documentation

## 2012-03-13 DIAGNOSIS — I129 Hypertensive chronic kidney disease with stage 1 through stage 4 chronic kidney disease, or unspecified chronic kidney disease: Secondary | ICD-10-CM | POA: Insufficient documentation

## 2012-03-13 DIAGNOSIS — K219 Gastro-esophageal reflux disease without esophagitis: Secondary | ICD-10-CM | POA: Insufficient documentation

## 2012-03-13 DIAGNOSIS — Z7982 Long term (current) use of aspirin: Secondary | ICD-10-CM | POA: Insufficient documentation

## 2012-03-13 DIAGNOSIS — G2581 Restless legs syndrome: Secondary | ICD-10-CM | POA: Insufficient documentation

## 2012-03-13 DIAGNOSIS — Z862 Personal history of diseases of the blood and blood-forming organs and certain disorders involving the immune mechanism: Secondary | ICD-10-CM | POA: Insufficient documentation

## 2012-03-13 DIAGNOSIS — F411 Generalized anxiety disorder: Secondary | ICD-10-CM | POA: Insufficient documentation

## 2012-03-13 DIAGNOSIS — Z8709 Personal history of other diseases of the respiratory system: Secondary | ICD-10-CM | POA: Insufficient documentation

## 2012-03-13 DIAGNOSIS — R079 Chest pain, unspecified: Secondary | ICD-10-CM | POA: Insufficient documentation

## 2012-03-13 LAB — BASIC METABOLIC PANEL
CO2: 27 mEq/L (ref 19–32)
Glucose, Bld: 213 mg/dL — ABNORMAL HIGH (ref 70–99)
Potassium: 4 mEq/L (ref 3.5–5.1)
Sodium: 136 mEq/L (ref 135–145)

## 2012-03-13 LAB — POCT I-STAT TROPONIN I

## 2012-03-13 LAB — CBC
Hemoglobin: 14.8 g/dL (ref 12.0–15.0)
RBC: 4.95 MIL/uL (ref 3.87–5.11)

## 2012-03-13 NOTE — ED Notes (Signed)
Pt complaining of SOB, placed on 2L via [place]

## 2012-03-13 NOTE — ED Notes (Signed)
Per EMS, Pt coming from [facility]. Pt started having CP approx 3 hours ago. According to EMS pt stated pain went away when repositioned in bed. Pt has received 1 nitro and 1 percocet at facility. Vitals stable. EKG unremarkable.

## 2012-03-13 NOTE — ED Provider Notes (Signed)
History     CSN: [id number]  Arrival date & time 03/13/12  2216   First MD Initiated Contact with Patient 03/13/12 2303      Chief Complaint  Patient presents with  . Chest Pain    HPI Pt is an 79 yo F presenting from [facility] for complaint of chest pain. She states she had a sharp pain in "her heart"/midline chest, but is currently pain free. She is now s/p nitro x1 and percocet x1. She states taking a deep breath made the pain worse, but unsure what improved the pain, although EMS states her pain improved with repositioning. She states she does not remember if the pain radiated, but she only remembers pain in the middle of her chest. Pt has a PMH of CAD. Last echo January 2013 showed normal EF and grade 1 diastolic dysfunction. She is followed by [facility] cardiology. Pt requires wheelchair at SNF, and does not ambulate. She has a DNR.  Past Medical History  Diagnosis Date  . Hypertension   . Diabetes mellitus   . Dysrhythmia     a-fib  . Anemia   . Acute respiratory failure   . Chronic kidney disease     Kidney failure  . Allergic rhinitis   . Osteoarthrosis   . Osteoporosis   . Anxiety   . Depression   . Restless leg syndrome   . GERD (gastroesophageal reflux disease)   . Hypertonic bladder   . Peripheral vascular disease   . Coronary atherosclerosis due to lipid rich plaque   . DNR (do not resuscitate)     Past Surgical History  Procedure Laterality Date  . Colon surgery      has colostomy  . Abdominal hysterectomy    . Appendectomy    . Tonsillectomy    . Cardiac catheterization      bilateral cataract surgery  . Diaphragmatic hernia repair      with gangrene  . Orif patella  12/07/2010    Procedure: OPEN REDUCTION INTERNAL (ORIF) FIXATION PATELLA;  Surgeon: Nadara Mustard, MD;  Location: [facility] OR;  Service: Orthopedics;  Laterality: Left;  Left Knee Hamstring Release and Open Reduction Internal Fixation Patella  . Orif patella  12/21/2010    Procedure: OPEN  REDUCTION INTERNAL (ORIF) FIXATION PATELLA;  Surgeon: Nadara Mustard, MD;  Location: [facility] OR;  Service: Orthopedics;  Laterality: Left;  Left Knee Hamstring Release and Open Reduction Internal Fixation Patella    No family history on file.  History  Substance Use Topics  . Smoking status: Never Smoker   . Smokeless tobacco: Never Used  . Alcohol Use: No    OB History   Grav Para Term Preterm Abortions TAB SAB Ect Mult Living                  Review of Systems  Constitutional: Negative for fever.  HENT: Positive for congestion.   Respiratory: Negative for shortness of breath.   Cardiovascular: Positive for chest pain. Negative for leg swelling.  Gastrointestinal: Negative for abdominal pain.  Skin: Negative for rash.  All other systems reviewed and are negative.    Allergies  Penicillins  Home Medications   Current Outpatient Rx  Name  Route  Sig  Dispense  Refill  . albuterol (PROVENTIL HFA;VENTOLIN HFA) 108 (90 BASE) MCG/ACT inhaler   Inhalation   Inhale 2 puffs into the lungs every 6 (six) hours as needed for wheezing.         Marland Kitchen  alum & mag hydroxide-simeth (MAALOX/MYLANTA) 200-200-20 MG/5ML suspension   Oral   Take 60 mLs by mouth every 6 (six) hours as needed for indigestion.         . Artificial Tear Ointment (LACRI-LUBE OP)   Both Eyes   Place 1 drop into both eyes at bedtime.          Marland Kitchen aspirin EC 81 MG tablet   Oral   Take 81 mg by mouth daily.         . baclofen (LIORESAL) 5 mg TABS   Oral   Take 5 mg by mouth 3 (three) times daily as needed (neck spasms).         . calcium-vitamin D (OSCAL WITH D) 500-200 MG-UNIT per tablet   Oral   Take 1 tablet by mouth daily.           . cycloSPORINE (RESTASIS) 0.05 % ophthalmic emulsion   Both Eyes   Place 1 drop into both eyes 2 (two) times daily.           . diclofenac sodium (VOLTAREN) 1 % GEL   Topical   Apply 1 application topically 4 (four) times daily as needed (knee pain). For both  knees         . DULoxetine (CYMBALTA) 60 MG capsule   Oral   Take 60 mg by mouth daily.           . Fluticasone-Salmeterol (ADVAIR) 100-50 MCG/DOSE AEPB   Inhalation   Inhale 1 puff into the lungs daily.          . furosemide (LASIX) 20 MG tablet   Oral   Take 20 mg by mouth daily.           Marland Kitchen gabapentin (NEURONTIN) 400 MG capsule   Oral   Take 400 mg by mouth at bedtime.           . insulin aspart (NOVOLOG) 100 UNIT/ML injection   Subcutaneous   Inject 10 Units into the skin 3 (three) times daily before meals. Do Not give at bedtime         . insulin detemir (LEVEMIR) 100 UNIT/ML injection   Subcutaneous   Inject 15-20 Units into the skin 2 (two) times daily. 15units at bedtime 20 units daily         . methylcellulose (ARTIFICIAL TEARS) 1 % ophthalmic solution   Both Eyes   Place 1 drop into both eyes 4 (four) times daily.         . metoprolol succinate (TOPROL-XL) 25 MG 24 hr tablet   Oral   Take 25 mg by mouth daily.           . Multiple Vitamins-Minerals (MULTIVITAMINS THER. W/MINERALS) TABS   Oral   Take 1 tablet by mouth daily.           . nitroGLYCERIN (NITROSTAT) 0.4 MG SL tablet   Sublingual   Place 0.4 mg under the tongue every 5 (five) minutes as needed for chest pain.         Marland Kitchen omeprazole (PRILOSEC OTC) 20 MG tablet   Oral   Take 20 mg by mouth daily.         Marland Kitchen oxyCODONE-acetaminophen (PERCOCET) 5-325 MG per tablet   Oral   Take 1 tablet by mouth every 6 (six) hours as needed. For pain   10 tablet   0   . rOPINIRole (REQUIP) 0.25 MG tablet   Oral   Take 0.25  mg by mouth 2 (two) times daily.           Marland Kitchen senna (SENOKOT) 8.6 MG TABS   Oral   Take 1 tablet by mouth at bedtime.           . traZODone (DESYREL) 50 MG tablet   Oral   Take 50 mg by mouth at bedtime. For insomnia         . Vitamin D, Ergocalciferol, (DRISDOL) 50000 UNITS CAPS   Oral   Take 50,000 Units by mouth every 7 (seven) days. mondays            BP 122/62  Pulse 81  Temp(Src) 98.3 F (36.8 C) (Oral)  Resp 26  SpO2 97%  Physical Exam  Constitutional: She appears well-developed and well-nourished. No distress.  HENT:  Head: Normocephalic and atraumatic.  Mouth/Throat: Mucous membranes are dry. She has dentures.  [place] in place  Eyes:  Dry eyes, difficulty opening right eye in bright light  Neck: Normal range of motion.  Cardiovascular: Normal rate, regular rhythm and normal heart sounds.   No murmur heard. Pulmonary/Chest: Effort normal and breath sounds normal. She has no wheezes.  Abdominal: Soft. There is no tenderness.  Colostomy LLQ with green stool in bag  Musculoskeletal: She exhibits no edema and no tenderness.  Lymphadenopathy:    She has no cervical adenopathy.  Neurological: She is alert.  Oriented to person, states she is in hospital but unaware which one, and states it is October 2013. Good grip strength bilaterally.  Skin: Skin is warm and dry. No rash noted.    ED Course  Procedures (including critical care time)  Labs Reviewed  BASIC METABOLIC PANEL - Abnormal; Notable for the following:    Glucose, Bld 213 (*)    GFR calc non Af Amer 77 (*)    GFR calc Af Amer 89 (*)    All other components within normal limits  CBC  POCT I-STAT TROPONIN I   Dg Chest Port 1 View  03/13/2012  *RADIOLOGY REPORT*  Clinical Data: Chest pain.  PORTABLE CHEST - 1 VIEW  Comparison: 03/22/2011.  Findings: Stable mildly enlarged cardiac silhouette.  Decreased inspiration with no gross change in mildly prominent interstitial markings.  Diffuse osteopenia.  Thoracic spine degenerative changes.  IMPRESSION: No acute abnormality.  Stable mild cardiomegaly and mild chronic interstitial lung disease.   Original Report Authenticated By: Beckie Salts, M.D.     Date: 03/13/2012  Rate: 82  Rhythm: normal sinus rhythm  QRS Axis: normal  Intervals: normal  ST/T Wave abnormalities: normal  Conduction Disutrbances: low voltage   Old EKG Reviewed: No significant changes noted   1. Chest pain      MDM  79 yo F presenting from SNF with non-radiating, substernal CP that resolved with repositioning, nitro and Percocet.  Bmet, CBC, Troponin, EKG, CXR and telemetry ordered. Pt is currently pain free and states she had rather return to her facility if possible.   5- Spoke with representative at [facility] to agrees with story, with the addition that she also received Mylanta. Pt remains pain free. Her work up has been completely unremarkable, and given the very low suspicion of a cardiac process, patient will be discharged back to [facility] in stable condition. Pt will follow up with her Cardiologist. No further work up at this time required.   Hilarie Fredrickson, MD 03/14/12 [phone]

## 2012-03-14 NOTE — ED Notes (Signed)
Pt moved to Pod C while waiting for [facility] arrival

## 2012-03-14 NOTE — ED Provider Notes (Signed)
I have personally seen and examined the patient.  I have discussed the plan of care with the resident.  I have reviewed the documentation on PMH/FH/Soc. History.  I have reviewed the documentation of the resident and agree.   I have reviewed and agree with the ECG interpretation(s) documented by the resident.  Pt with very atypical CP story, seemed improved with position.  I doubt ACS/PE at this time         Joya Gaskins, MD 03/14/12 2328

## 2012-03-14 NOTE — ED Notes (Signed)
[facility] paged. 

## 2012-04-14 ENCOUNTER — Non-Acute Institutional Stay (SKILLED_NURSING_FACILITY): Payer: Medicare Other | Admitting: Internal Medicine

## 2012-04-14 DIAGNOSIS — J019 Acute sinusitis, unspecified: Secondary | ICD-10-CM

## 2012-04-19 ENCOUNTER — Encounter: Payer: Self-pay | Admitting: Internal Medicine

## 2012-04-19 MED ORDER — GUAIFENESIN ER 600 MG PO TB12: 600.0000 mg | ORAL_TABLET | Freq: Two times a day (BID) | ORAL | Status: AC

## 2012-04-19 NOTE — Progress Notes (Signed)
Patient ID: Shelia Webb, female   DOB: [birth year], 79 y.o.   MRN: [id number]  Chief Complaint:  Continuous nasal drainage HPI:  79yo female with h/o dementia, prior stroke, visual impairment, DMII, CAD, and osteoarthritis was seen due to c/o continuous nasal drainage, nursing staff concerns of decreased lung sounds and coughing.  When seen, she did c/o above. Denies headache, earache, sore throat, cough is productive of yellow sputum.  Denies n/v/d.  Is most concerned about her nasal congestion and cough keeping her awake.  Review of Systems:  Review of Systems  Constitutional: Positive for chills and malaise/fatigue. Negative for fever.  HENT: Positive for congestion. Negative for ear pain and sore throat.   Eyes: Positive for blurred vision.  Respiratory: Positive for cough and sputum production. Negative for shortness of breath and wheezing.   Cardiovascular: Negative for chest pain, palpitations and leg swelling.  Gastrointestinal: Positive for constipation. Negative for heartburn, nausea, vomiting, abdominal pain and diarrhea.  Genitourinary: Negative for dysuria, urgency and frequency.  Musculoskeletal: Negative for back pain and falls.  Skin: Negative for rash.  Neurological: Positive for focal weakness and weakness. Negative for dizziness, loss of consciousness and headaches.  Endo/Heme/Allergies: Does not bruise/bleed easily.  Psychiatric/Behavioral: Positive for depression and memory loss. The patient does not have insomnia.      Medications: Patient's Medications  New Prescriptions   No medications on file  Previous Medications   ALBUTEROL (PROVENTIL HFA;VENTOLIN HFA) 108 (90 BASE) MCG/ACT INHALER    Inhale 2 puffs into the lungs every 6 (six) hours as needed for wheezing.   ALUM & MAG HYDROXIDE-SIMETH (MAALOX/MYLANTA) 200-200-20 MG/5ML SUSPENSION    Take 60 mLs by mouth every 6 (six) hours as needed for indigestion.   ARTIFICIAL TEAR OINTMENT (LACRI-LUBE OP)    Place 1 drop  into both eyes at bedtime.    ASPIRIN EC 81 MG TABLET    Take 81 mg by mouth daily.   BACLOFEN (LIORESAL) 5 MG TABS    Take 5 mg by mouth 3 (three) times daily as needed (neck spasms).   CALCIUM-VITAMIN D (OSCAL WITH D) 500-200 MG-UNIT PER TABLET    Take 1 tablet by mouth daily.     CYCLOSPORINE (RESTASIS) 0.05 % OPHTHALMIC EMULSION    Place 1 drop into both eyes 2 (two) times daily.     DICLOFENAC SODIUM (VOLTAREN) 1 % GEL    Apply 1 application topically 4 (four) times daily as needed (knee pain). For both knees   DULOXETINE (CYMBALTA) 60 MG CAPSULE    Take 60 mg by mouth daily.     FLUTICASONE-SALMETEROL (ADVAIR) 100-50 MCG/DOSE AEPB    Inhale 1 puff into the lungs daily.    FUROSEMIDE (LASIX) 20 MG TABLET    Take 20 mg by mouth daily.     GABAPENTIN (NEURONTIN) 400 MG CAPSULE    Take 400 mg by mouth at bedtime.     INSULIN ASPART (NOVOLOG) 100 UNIT/ML INJECTION    Inject 10 Units into the skin 3 (three) times daily before meals. Do Not give at bedtime   INSULIN DETEMIR (LEVEMIR) 100 UNIT/ML INJECTION    Inject 15-20 Units into the skin 2 (two) times daily. 15units at bedtime 20 units daily   METHYLCELLULOSE (ARTIFICIAL TEARS) 1 % OPHTHALMIC SOLUTION    Place 1 drop into both eyes 4 (four) times daily.   METOPROLOL SUCCINATE (TOPROL-XL) 25 MG 24 HR TABLET    Take 25 mg by mouth daily.  MULTIPLE VITAMINS-MINERALS (MULTIVITAMINS THER. W/MINERALS) TABS    Take 1 tablet by mouth daily.     NITROGLYCERIN (NITROSTAT) 0.4 MG SL TABLET    Place 0.4 mg under the tongue every 5 (five) minutes as needed for chest pain.   OMEPRAZOLE (PRILOSEC OTC) 20 MG TABLET    Take 20 mg by mouth daily.   OXYCODONE-ACETAMINOPHEN (PERCOCET) 5-325 MG PER TABLET    Take 1 tablet by mouth every 6 (six) hours as needed. For pain   ROPINIROLE (REQUIP) 0.25 MG TABLET    Take 0.25 mg by mouth 2 (two) times daily.     SENNA (SENOKOT) 8.6 MG TABS    Take 1 tablet by mouth at bedtime.     TRAZODONE (DESYREL) 50 MG TABLET     Take 50 mg by mouth at bedtime. For insomnia   VITAMIN D, ERGOCALCIFEROL, (DRISDOL) 50000 UNITS CAPS    Take 50,000 Units by mouth every 7 (seven) days. mondays  Modified Medications   No medications on file  Discontinued Medications   No medications on file     Physical Exam: Physical Exam  Constitutional: No distress.  HENT:  Head: Normocephalic and atraumatic.  Nose: Rhinorrhea present.  Mouth/Throat: Oropharyngeal exudate and posterior oropharyngeal erythema present.  Eyes: Pupils are equal, round, and reactive to light.  Neck: No JVD present.  Cardiovascular: Normal rate, regular rhythm and normal heart sounds.  Exam reveals no gallop and no friction rub.   No murmur heard. Pulmonary/Chest: No respiratory distress. She has no wheezes. She has no rales. She exhibits no tenderness.  Diminished breath sounds at bases, rhonchi in upper lobes  Abdominal: Soft. Bowel sounds are normal. She exhibits no distension and no mass. There is no tenderness. There is no rebound and no guarding.  Genitourinary:  Bladder nontender, nondistended  Musculoskeletal: She exhibits no edema and no tenderness.  Lymphadenopathy:    She has no cervical adenopathy.  Neurological: She is alert. A cranial nerve deficit is present.  Oriented to person and place, not to time  Skin: Skin is warm and dry. She is not diaphoretic.  Psychiatric: She has a normal mood and affect. Cognition and memory are impaired. She expresses inappropriate judgment. She exhibits abnormal recent memory.  Altered judgment due to dementia, poor insight into her memory loss     There were no vitals filed for this visit. VS:  T 98 HR 77 RR 20 BP 155/79 POX 97%RA  Labs reviewed: Basic Metabolic Panel:  Recent Labs  40/98/11 2251  NA 136  K 4.0  CL 98  CO2 27  GLUCOSE 213*  BUN 10  CREATININE 0.66  CALCIUM 9.2    Liver Function Tests: No results found for this basename: AST, ALT, ALKPHOS, BILITOT, PROT, ALBUMIN,  in  the last 8760 hours  CBC:  Recent Labs  03/13/12 2251  WBC 7.3  HGB 14.8  HCT 43.2  MCV 87.3  PLT 174    Assessment/Plan No problem-specific assessment & plan notes found for this encounter. Acute sinusitis:  Suspected;  Has h/o allergic rhinitis.  Due to diminished breath sounds and rhonchi, will obtain pCXR to r/o bronchitis.  Encourage hydration.  Obtain cbc, bmp and VS q shift x 72 hours, notify md/np if develops fever and report abnormal cxr results.  Family/ staff Communication: Reviewed A/P with nursing supervisor who was present for visit with resident.  Labs/tests ordered  Cbc, bmp, pcxr

## 2012-05-01 ENCOUNTER — Non-Acute Institutional Stay (SKILLED_NURSING_FACILITY): Payer: Medicare Other | Admitting: Internal Medicine

## 2012-05-01 DIAGNOSIS — IMO0002 Reserved for concepts with insufficient information to code with codable children: Secondary | ICD-10-CM

## 2012-05-01 DIAGNOSIS — E1142 Type 2 diabetes mellitus with diabetic polyneuropathy: Secondary | ICD-10-CM

## 2012-05-01 DIAGNOSIS — E1349 Other specified diabetes mellitus with other diabetic neurological complication: Secondary | ICD-10-CM

## 2012-05-01 DIAGNOSIS — M792 Neuralgia and neuritis, unspecified: Secondary | ICD-10-CM

## 2012-05-01 NOTE — Progress Notes (Signed)
Patient ID: Shelia Webb, female   DOB: Jan 09, 1922, 79 y.o.   MRN: [id number]  CC- b/l foot and leg pain  HPI- 79 y/o female with h/o dementia, prior stroke, visual impairment, DMII, CAD, and osteoarthritis was seen due to b/l foot and leg pain for few days. She is not a great historian due to her dementia. Her foot hurts intermittently. No swelling or redness reported by staff. No trauma reported.  Review of Systems:  Constitutional:  Negative for fever, chills HENT: Negative for ear pain and sore throat.  Respiratory:  Negative for shortness of breath and wheezing.  Cardiovascular: Negative for chest pain, palpitations and leg swelling.  Gastrointestinal: Negative for heartburn, nausea, vomiting, abdominal pain and diarrhea.  Genitourinary: Negative for dysuria, urgency and frequency.  Musculoskeletal: Negative for back pain and falls.  Skin: Negative for rash.  Neurological: weakness present, Negative for dizziness, loss of consciousness and headaches.  Endo/Heme/Allergies: Does not bruise/bleed easily.  Psychiatric/Behavioral: Positive for depression and memory loss. The patient does not have insomnia.   PHYSICAL EXAM- BP 120/68  Pulse 77  Temp(Src) 97.5 F (36.4 C)  Resp 17  Ht 5' (1.524 m)  Wt 148 lb (67.132 kg)  BMI 28.9 kg/m2  SpO2 95%  Constitutional: No distress.  HENT: Normocephalic and atraumatic. No nasal congestion Eyes: Pupils are equal, round, and reactive to light.  Neck: No JVD present.  Cardiovascular: Normal rate, regular rhythm and normal heart sounds. Exam reveals no gallop and no friction rub. No murmur heard.  Pulmonary/Chest: No respiratory distress. She has no wheezes. She has no rales. She exhibits no tenderness.  Abdominal: Soft. Bowel sounds are normal. She exhibits no distension and no mass. There is no tenderness. There is no rebound and no guarding.  Genitourinary: Bladder nontender, nondistended  Musculoskeletal: She exhibits no edema and no  tenderness. No erythema. Skin intact. Lymphadenopathy: She has no cervical adenopathy.  Neurological: She is alert. Oriented to person and place, not to time  Skin: Skin is warm and dry. She is not diaphoretic.  Psychiatric: She has a normal mood and affect. Cognition and memory are impaired. She expresses inappropriate judgment. She exhibits abnormal recent memory.   ASSESSMENT/PLAN-  Foot pain- no signs of cellulitis, joint inflammation. Possible neuropathic pain.on neurontin 400 mg daily and percocet 5-325 1 tab q6h prn. Will change neurontin to 300 mg bid for now and reassess  DM- continue current regimen of insulin and monitor cbg. Will follow a1c on next routine check

## 2012-06-17 ENCOUNTER — Emergency Department (HOSPITAL_COMMUNITY): Payer: Medicare Other

## 2012-06-17 ENCOUNTER — Encounter (HOSPITAL_COMMUNITY): Payer: Self-pay

## 2012-06-17 ENCOUNTER — Inpatient Hospital Stay (HOSPITAL_COMMUNITY)
Admission: EM | Admit: 2012-06-17 | Discharge: 2012-06-22 | DRG: 178 | Disposition: A | Discharge status: Skilled Nursing Facility | Payer: Medicare Other | Attending: Internal Medicine | Admitting: Internal Medicine

## 2012-06-17 DIAGNOSIS — G2581 Restless legs syndrome: Secondary | ICD-10-CM

## 2012-06-17 DIAGNOSIS — I10 Essential (primary) hypertension: Secondary | ICD-10-CM

## 2012-06-17 DIAGNOSIS — K648 Other hemorrhoids: Secondary | ICD-10-CM

## 2012-06-17 DIAGNOSIS — N318 Other neuromuscular dysfunction of bladder: Secondary | ICD-10-CM

## 2012-06-17 DIAGNOSIS — A419 Sepsis, unspecified organism: Secondary | ICD-10-CM

## 2012-06-17 DIAGNOSIS — F329 Major depressive disorder, single episode, unspecified: Secondary | ICD-10-CM | POA: Diagnosis present

## 2012-06-17 DIAGNOSIS — F411 Generalized anxiety disorder: Secondary | ICD-10-CM | POA: Diagnosis present

## 2012-06-17 DIAGNOSIS — I129 Hypertensive chronic kidney disease with stage 1 through stage 4 chronic kidney disease, or unspecified chronic kidney disease: Secondary | ICD-10-CM | POA: Diagnosis present

## 2012-06-17 DIAGNOSIS — K439 Ventral hernia without obstruction or gangrene: Secondary | ICD-10-CM | POA: Diagnosis present

## 2012-06-17 DIAGNOSIS — D126 Benign neoplasm of colon, unspecified: Secondary | ICD-10-CM

## 2012-06-17 DIAGNOSIS — E669 Obesity, unspecified: Secondary | ICD-10-CM | POA: Diagnosis present

## 2012-06-17 DIAGNOSIS — M810 Age-related osteoporosis without current pathological fracture: Secondary | ICD-10-CM | POA: Diagnosis present

## 2012-06-17 DIAGNOSIS — N200 Calculus of kidney: Secondary | ICD-10-CM | POA: Diagnosis present

## 2012-06-17 DIAGNOSIS — J189 Pneumonia, unspecified organism: Secondary | ICD-10-CM

## 2012-06-17 DIAGNOSIS — E119 Type 2 diabetes mellitus without complications: Secondary | ICD-10-CM | POA: Diagnosis present

## 2012-06-17 DIAGNOSIS — K219 Gastro-esophageal reflux disease without esophagitis: Secondary | ICD-10-CM | POA: Diagnosis present

## 2012-06-17 DIAGNOSIS — Z933 Colostomy status: Secondary | ICD-10-CM

## 2012-06-17 DIAGNOSIS — K5730 Diverticulosis of large intestine without perforation or abscess without bleeding: Secondary | ICD-10-CM

## 2012-06-17 DIAGNOSIS — B952 Enterococcus as the cause of diseases classified elsewhere: Secondary | ICD-10-CM | POA: Diagnosis present

## 2012-06-17 DIAGNOSIS — Z794 Long term (current) use of insulin: Secondary | ICD-10-CM

## 2012-06-17 DIAGNOSIS — J45909 Unspecified asthma, uncomplicated: Secondary | ICD-10-CM | POA: Diagnosis present

## 2012-06-17 DIAGNOSIS — Z6829 Body mass index (BMI) 29.0-29.9, adult: Secondary | ICD-10-CM

## 2012-06-17 DIAGNOSIS — I739 Peripheral vascular disease, unspecified: Secondary | ICD-10-CM

## 2012-06-17 DIAGNOSIS — J690 Pneumonitis due to inhalation of food and vomit: Principal | ICD-10-CM | POA: Diagnosis present

## 2012-06-17 DIAGNOSIS — K222 Esophageal obstruction: Secondary | ICD-10-CM

## 2012-06-17 DIAGNOSIS — Z66 Do not resuscitate: Secondary | ICD-10-CM | POA: Diagnosis present

## 2012-06-17 DIAGNOSIS — F05 Delirium due to known physiological condition: Secondary | ICD-10-CM | POA: Diagnosis not present

## 2012-06-17 DIAGNOSIS — R269 Unspecified abnormalities of gait and mobility: Secondary | ICD-10-CM

## 2012-06-17 DIAGNOSIS — Z7982 Long term (current) use of aspirin: Secondary | ICD-10-CM

## 2012-06-17 DIAGNOSIS — Z79899 Other long term (current) drug therapy: Secondary | ICD-10-CM

## 2012-06-17 DIAGNOSIS — Z9089 Acquired absence of other organs: Secondary | ICD-10-CM

## 2012-06-17 DIAGNOSIS — M1990 Unspecified osteoarthritis, unspecified site: Secondary | ICD-10-CM

## 2012-06-17 DIAGNOSIS — I2510 Atherosclerotic heart disease of native coronary artery without angina pectoris: Secondary | ICD-10-CM | POA: Diagnosis present

## 2012-06-17 DIAGNOSIS — R410 Disorientation, unspecified: Secondary | ICD-10-CM | POA: Clinically undetermined

## 2012-06-17 DIAGNOSIS — G459 Transient cerebral ischemic attack, unspecified: Secondary | ICD-10-CM

## 2012-06-17 DIAGNOSIS — H04129 Dry eye syndrome of unspecified lacrimal gland: Secondary | ICD-10-CM

## 2012-06-17 DIAGNOSIS — R1310 Dysphagia, unspecified: Secondary | ICD-10-CM | POA: Diagnosis present

## 2012-06-17 DIAGNOSIS — E876 Hypokalemia: Secondary | ICD-10-CM | POA: Diagnosis not present

## 2012-06-17 DIAGNOSIS — E860 Dehydration: Secondary | ICD-10-CM | POA: Diagnosis present

## 2012-06-17 DIAGNOSIS — I639 Cerebral infarction, unspecified: Secondary | ICD-10-CM

## 2012-06-17 DIAGNOSIS — E873 Alkalosis: Secondary | ICD-10-CM | POA: Diagnosis present

## 2012-06-17 DIAGNOSIS — N189 Chronic kidney disease, unspecified: Secondary | ICD-10-CM | POA: Diagnosis present

## 2012-06-17 DIAGNOSIS — F3289 Other specified depressive episodes: Secondary | ICD-10-CM | POA: Diagnosis present

## 2012-06-17 DIAGNOSIS — Z8673 Personal history of transient ischemic attack (TIA), and cerebral infarction without residual deficits: Secondary | ICD-10-CM

## 2012-06-17 DIAGNOSIS — M6250 Muscle wasting and atrophy, not elsewhere classified, unspecified site: Secondary | ICD-10-CM | POA: Diagnosis present

## 2012-06-17 DIAGNOSIS — Z880 Allergy status to penicillin: Secondary | ICD-10-CM

## 2012-06-17 DIAGNOSIS — N390 Urinary tract infection, site not specified: Secondary | ICD-10-CM | POA: Diagnosis present

## 2012-06-17 DIAGNOSIS — R112 Nausea with vomiting, unspecified: Secondary | ICD-10-CM

## 2012-06-17 LAB — COMPREHENSIVE METABOLIC PANEL
ALT: 17 U/L (ref 0–35)
AST: 21 U/L (ref 0–37)
CO2: 31 mEq/L (ref 19–32)
Calcium: 10.2 mg/dL (ref 8.4–10.5)
Chloride: 100 mEq/L (ref 96–112)
GFR calc non Af Amer: 58 mL/min — ABNORMAL LOW (ref >90)
Sodium: 143 mEq/L (ref 135–145)
Total Bilirubin: 0.4 mg/dL (ref 0.3–1.2)

## 2012-06-17 LAB — CBC WITH DIFFERENTIAL/PLATELET
Basophils Relative: 0 % (ref 0–1)
Eosinophils Absolute: 0 10*3/uL (ref 0.0–0.7)
Eosinophils Relative: 0 % (ref 0–5)
Hemoglobin: 18 g/dL — ABNORMAL HIGH (ref 12.0–15.0)
MCH: 30.6 pg (ref 26.0–34.0)
MCHC: 34.6 g/dL (ref 30.0–36.0)
MCV: 88.3 fL (ref 78.0–100.0)
Monocytes Relative: 7 % (ref 3–12)
Neutrophils Relative %: 82 % — ABNORMAL HIGH (ref 43–77)

## 2012-06-17 LAB — POCT I-STAT TROPONIN I: Troponin i, poc: 0.01 ng/mL (ref 0.00–0.08)

## 2012-06-17 MED ORDER — FAMOTIDINE IN NACL 20-0.9 MG/50ML-% IV SOLN
20.0000 mg | Freq: Once | INTRAVENOUS | Status: AC
  Administered 2012-06-18: 20 mg via INTRAVENOUS
  Filled 2012-06-17: qty 50

## 2012-06-17 MED ORDER — SODIUM CHLORIDE 0.9 % IV SOLN
INTRAVENOUS | Status: DC
  Administered 2012-06-18 – 2012-06-20 (×2): via INTRAVENOUS

## 2012-06-17 MED ORDER — ONDANSETRON HCL 4 MG/2ML IJ SOLN
4.0000 mg | Freq: Once | INTRAMUSCULAR | Status: AC
  Administered 2012-06-18: 4 mg via INTRAVENOUS
  Filled 2012-06-17: qty 2

## 2012-06-17 MED ORDER — ONDANSETRON HCL 4 MG/2ML IJ SOLN: 4.0000 mg | Freq: Once | INTRAMUSCULAR | Status: DC

## 2012-06-17 MED ORDER — SODIUM CHLORIDE 0.9 % IV BOLUS (SEPSIS)
1000.0000 mL | Freq: Once | INTRAVENOUS | Status: AC
  Administered 2012-06-17: 1000 mL via INTRAVENOUS

## 2012-06-17 NOTE — ED Provider Notes (Signed)
History     CSN: [id number]  Arrival date & time 06/17/12  2200   First MD Initiated Contact with Patient 06/17/12 2256      Chief Complaint  Patient presents with  . Emesis    (Consider location/radiation/quality/duration/timing/severity/associated sxs/prior treatment) HPI  79 yo woman with multiple chronic medical problems including DM, HTN, AF, CKD, history of respiratory failure, CAD and PAD here from SNF with report of brown emesis. Patient able to tell me that she is at [facility] because she has been vomiting. But, unable to obtain much more history. She says she has abdominal pain but, can't describe or quantify. Can't tell me when it started. Last BM unknown.   Patient is noted to have colostomy. Can't tell me when it was last emptied or why it was placed. Patient is not accompanied by family or friends at this point.   Past Medical History  Diagnosis Date  . Hypertension   . Diabetes mellitus   . Dysrhythmia     a-fib  . Anemia   . Acute respiratory failure   . Chronic kidney disease     Kidney failure  . Allergic rhinitis   . Osteoarthrosis   . Osteoporosis   . Anxiety   . Depression   . Restless leg syndrome   . GERD (gastroesophageal reflux disease)   . Hypertonic bladder   . Peripheral vascular disease   . Coronary atherosclerosis due to lipid rich plaque   . DNR (do not resuscitate)     Past Surgical History  Procedure Laterality Date  . Colon surgery      has colostomy  . Abdominal hysterectomy    . Appendectomy    . Tonsillectomy    . Cardiac catheterization      bilateral cataract surgery  . Diaphragmatic hernia repair      with gangrene  . Orif patella  12/07/2010    Procedure: OPEN REDUCTION INTERNAL (ORIF) FIXATION PATELLA;  Surgeon: Nadara Mustard, MD;  Location: [facility] OR;  Service: Orthopedics;  Laterality: Left;  Left Knee Hamstring Release and Open Reduction Internal Fixation Patella  . Orif patella  12/21/2010    Procedure: OPEN  REDUCTION INTERNAL (ORIF) FIXATION PATELLA;  Surgeon: Nadara Mustard, MD;  Location: [facility] OR;  Service: Orthopedics;  Laterality: Left;  Left Knee Hamstring Release and Open Reduction Internal Fixation Patella    No family history on file.  History  Substance Use Topics  . Smoking status: Never Smoker   . Smokeless tobacco: Never Used  . Alcohol Use: No    OB History   Grav Para Term Preterm Abortions TAB SAB Ect Mult Living                  Review of Systems  Gen: As per history of present illness, otherwise negative Eyes: no discharge or drainage, no occular pain or visual changes Nose: no epistaxis or rhinorrhea Mouth: no dental pain, no sore throat Neck: no neck pain Lungs: As per history of present illness, otherwise negative CV: no chest pain, palpitations, dependent edema or orthopnea Abd: History of present illness, otherwise negative GU: no dysuria or gross hematuria MSK: no myalgias or arthralgias Neuro: no headache, no focal neurologic deficits Skin: no rash Psyche: negative.  Allergies  Penicillins  Home Medications   Current Outpatient Rx  Name  Route  Sig  Dispense  Refill  . albuterol (PROVENTIL HFA;VENTOLIN HFA) 108 (90 BASE) MCG/ACT inhaler   Inhalation  Inhale 2 puffs into the lungs every 6 (six) hours as needed for wheezing.         . Artificial Tear Ointment (LACRI-LUBE OP)   Both Eyes   Place 1 drop into both eyes at bedtime.          Marland Kitchen aspirin EC 81 MG tablet   Oral   Take 81 mg by mouth daily.         . baclofen (LIORESAL) 5 mg TABS   Oral   Take 5 mg by mouth 3 (three) times daily as needed (neck spasms).         . calcium-vitamin D (OSCAL WITH D) 500-200 MG-UNIT per tablet   Oral   Take 1 tablet by mouth daily.           . cycloSPORINE (RESTASIS) 0.05 % ophthalmic emulsion   Both Eyes   Place 1 drop into both eyes 2 (two) times daily.           . diclofenac sodium (VOLTAREN) 1 % GEL   Topical   Apply 1 application  topically 4 (four) times daily as needed (knee pain). For both knees         . DULoxetine (CYMBALTA) 60 MG capsule   Oral   Take 60 mg by mouth daily.           . Fluticasone-Salmeterol (ADVAIR) 100-50 MCG/DOSE AEPB   Inhalation   Inhale 1 puff into the lungs daily.          . furosemide (LASIX) 20 MG tablet   Oral   Take 20 mg by mouth daily.           Marland Kitchen gabapentin (NEURONTIN) 300 MG capsule   Oral   Take 300 mg by mouth 2 (two) times daily.         . insulin aspart (NOVOLOG) 100 UNIT/ML injection   Subcutaneous   Inject 10 Units into the skin 3 (three) times daily before meals. Do Not give at bedtime         . insulin detemir (LEVEMIR) 100 UNIT/ML injection   Subcutaneous   Inject 15-20 Units into the skin 2 (two) times daily. 15units at bedtime 20 units daily         . methylcellulose (ARTIFICIAL TEARS) 1 % ophthalmic solution   Both Eyes   Place 1 drop into both eyes 4 (four) times daily.         . metoprolol succinate (TOPROL-XL) 25 MG 24 hr tablet   Oral   Take 25 mg by mouth daily.           . Multiple Vitamins-Minerals (MULTIVITAMINS THER. W/MINERALS) TABS   Oral   Take 1 tablet by mouth daily.           Marland Kitchen omeprazole (PRILOSEC OTC) 20 MG tablet   Oral   Take 20 mg by mouth daily.         Marland Kitchen oxyCODONE-acetaminophen (PERCOCET) 5-325 MG per tablet   Oral   Take 1 tablet by mouth every 6 (six) hours as needed. For pain   10 tablet   0   . promethazine (PHENERGAN) 25 MG suppository   Rectal   Place 25 mg rectally every 6 (six) hours as needed for nausea.         Marland Kitchen rOPINIRole (REQUIP) 0.25 MG tablet   Oral   Take 0.25 mg by mouth 2 (two) times daily.           Marland Kitchen  senna (SENOKOT) 8.6 MG TABS   Oral   Take 1 tablet by mouth at bedtime.           . traZODone (DESYREL) 50 MG tablet   Oral   Take 50 mg by mouth at bedtime. For insomnia         . Vitamin D, Ergocalciferol, (DRISDOL) 50000 UNITS CAPS   Oral   Take 50,000 Units  by mouth every 7 (seven) days. mondays           BP 140/73  Pulse 100  Temp(Src) 97.8 F (36.6 C) (Oral)  Resp 28  SpO2 92%  Physical Exam  On my exam - pulse is 102, BP 131/69, RR 32/min, sats 96% on RA  Gen: elderly and unwell appearing, alert but seems confused v. demented. Flat facies.  Head: NCAT Eyes: PERLA, EOMI Nose: wnl Mouth, oral mucosa is dehydrated appearing, no lesions.  Neck: supple, no jvd, no adenopathy Lungs: RR 32/min, BS diminished base bases, no wheezing, rhonchi or rales appreciated from anterior auscultation CV: rapid and irregular, pulse 106, palp pulses in extremities Abd: distension in the LLQ with ostomy bag, approx 150cc of light brown stool in ostomy bag, stoma looks healthy, no ttp appreciated on gentle palpation.  GU: wnl Ext: no edema, no cyanosis Neuro:  CN ii-xii appear grossly intact, pt can withdraw all four ext. Appears to have 4/5 strength right LE and 3+ LLE   ED Course  Procedures (including critical care time)   Results for orders placed during the hospital encounter of 06/17/12 (from the past 24 hour(s))  COMPREHENSIVE METABOLIC PANEL     Status: Abnormal   Collection Time    06/17/12 10:21 PM      Result Value Range   Sodium 143  135 - 145 mEq/L   Potassium 4.1  3.5 - 5.1 mEq/L   Chloride 100  96 - 112 mEq/L   CO2 31  19 - 32 mEq/L   Glucose, Bld 118 (*) 70 - 99 mg/dL   BUN 14  6 - 23 mg/dL   Creatinine, Ser 4.09  0.50 - 1.10 mg/dL   Calcium 81.1  8.4 - 91.4 mg/dL   Total Protein 8.4 (*) 6.0 - 8.3 g/dL   Albumin 4.0  3.5 - 5.2 g/dL   AST 21  0 - 37 U/L   ALT 17  0 - 35 U/L   Alkaline Phosphatase 106  39 - 117 U/L   Total Bilirubin 0.4  0.3 - 1.2 mg/dL   GFR calc non Af Amer 58 (*) >90 mL/min   GFR calc Af Amer 67 (*) >90 mL/min  CBC WITH DIFFERENTIAL     Status: Abnormal   Collection Time    06/17/12 10:21 PM      Result Value Range   WBC 16.6 (*) 4.0 - 10.5 K/uL   RBC 5.89 (*) 3.87 - 5.11 MIL/uL   Hemoglobin 18.0  (*) 12.0 - 15.0 g/dL   HCT 78.2 (*) 95.6 - 21.3 %   MCV 88.3  78.0 - 100.0 fL   MCH 30.6  26.0 - 34.0 pg   MCHC 34.6  30.0 - 36.0 g/dL   RDW 08.6  57.8 - 46.9 %   Platelets 182  150 - 400 K/uL   Neutrophils Relative % 82 (*) 43 - 77 %   Neutro Abs 13.6 (*) 1.7 - 7.7 K/uL   Lymphocytes Relative 11 (*) 12 - 46 %   Lymphs Abs 1.9  0.7 - 4.0 K/uL   Monocytes Relative 7  3 - 12 %   Monocytes Absolute 1.1 (*) 0.1 - 1.0 K/uL   Eosinophils Relative 0  0 - 5 %   Eosinophils Absolute 0.0  0.0 - 0.7 K/uL   Basophils Relative 0  0 - 1 %   Basophils Absolute 0.0  0.0 - 0.1 K/uL  LIPASE, BLOOD     Status: Abnormal   Collection Time    06/17/12 10:21 PM      Result Value Range   Lipase 10 (*) 11 - 59 U/L  POCT I-STAT TROPONIN I     Status: None   Collection Time    06/17/12 10:40 PM      Result Value Range   Troponin i, poc 0.01  0.00 - 0.08 ng/mL   Comment 3               MDM  Ddx:  Dka, dehydration, bowel obstruction, viral illness, occult infection, mesenteric ischemia, UTI with sepsis, pneumonia, electrolyte derrangement.   We are resusc with IVF and labs are pending. CXR, CT abd/pelvis pending. Patient with 3/4 SIRS criteria at this time. Anticipate admission.   0120:  Patient with sepsis &  pnemonia on CXR We are obtained blood cx and treating empirically with Vanc/Ceftriaxone/Zmax for HCAP. Resusc with crystalloid. Hospitalist paged approx 79m ago to admit.   CRITICAL CARE Performed by: Brandt Loosen   Total critical care time: 40  Critical care time was exclusive of separately billable procedures and treating other patients.  Critical care was necessary to treat or prevent imminent or life-threatening deterioration.  Critical care was time spent personally by me on the following activities: development of treatment plan with patient and/or surrogate as well as nursing, discussions with consultants, evaluation of patient's response to treatment, examination of patient,  obtaining history from patient or surrogate, ordering and performing treatments and interventions, ordering and review of laboratory studies, ordering and review of radiographic studies, pulse oximetry and re-evaluation of patient's condition.         Brandt Loosen, MD 06/18/12 [phone]

## 2012-06-17 NOTE — ED Notes (Signed)
[facility]- c/o vomiting x 2 tonight.  Staff at snf sts that it was a large amount brown in color.  Pt from [facility].

## 2012-06-18 ENCOUNTER — Encounter (HOSPITAL_COMMUNITY): Payer: Self-pay | Admitting: *Deleted

## 2012-06-18 DIAGNOSIS — R1310 Dysphagia, unspecified: Secondary | ICD-10-CM

## 2012-06-18 DIAGNOSIS — E860 Dehydration: Secondary | ICD-10-CM | POA: Diagnosis present

## 2012-06-18 DIAGNOSIS — N390 Urinary tract infection, site not specified: Secondary | ICD-10-CM | POA: Diagnosis present

## 2012-06-18 DIAGNOSIS — J189 Pneumonia, unspecified organism: Secondary | ICD-10-CM

## 2012-06-18 DIAGNOSIS — A419 Sepsis, unspecified organism: Secondary | ICD-10-CM

## 2012-06-18 DIAGNOSIS — R112 Nausea with vomiting, unspecified: Secondary | ICD-10-CM

## 2012-06-18 DIAGNOSIS — J690 Pneumonitis due to inhalation of food and vomit: Principal | ICD-10-CM

## 2012-06-18 DIAGNOSIS — Z79899 Other long term (current) drug therapy: Secondary | ICD-10-CM

## 2012-06-18 DIAGNOSIS — I6350 Cerebral infarction due to unspecified occlusion or stenosis of unspecified cerebral artery: Secondary | ICD-10-CM

## 2012-06-18 LAB — GLUCOSE, CAPILLARY: Glucose-Capillary: 83 mg/dL (ref 70–99)

## 2012-06-18 LAB — URINALYSIS, ROUTINE W REFLEX MICROSCOPIC
Bilirubin Urine: NEGATIVE
Ketones, ur: 15 mg/dL — AB
Nitrite: NEGATIVE
Protein, ur: 100 mg/dL — AB
Urobilinogen, UA: 0.2 mg/dL (ref 0.0–1.0)
pH: 7 (ref 5.0–8.0)

## 2012-06-18 LAB — CBC
HCT: 48 % — ABNORMAL HIGH (ref 36.0–46.0)
MCHC: 34.4 g/dL (ref 30.0–36.0)
MCV: 88.6 fL (ref 78.0–100.0)
Platelets: 160 10*3/uL (ref 150–400)
RDW: 14.6 % (ref 11.5–15.5)
WBC: 11.9 10*3/uL — ABNORMAL HIGH (ref 4.0–10.5)

## 2012-06-18 LAB — CBC WITH DIFFERENTIAL/PLATELET
Eosinophils Absolute: 0 10*3/uL (ref 0.0–0.7)
Eosinophils Relative: 0 % (ref 0–5)
HCT: 45.9 % (ref 36.0–46.0)
Hemoglobin: 15.5 g/dL — ABNORMAL HIGH (ref 12.0–15.0)
Lymphs Abs: 2.2 10*3/uL (ref 0.7–4.0)
MCH: 30.2 pg (ref 26.0–34.0)
MCV: 89.5 fL (ref 78.0–100.0)
Monocytes Relative: 7 % (ref 3–12)
RBC: 5.13 MIL/uL — ABNORMAL HIGH (ref 3.87–5.11)

## 2012-06-18 LAB — CREATININE, SERUM: GFR calc Af Amer: 89 mL/min — ABNORMAL LOW (ref >90)

## 2012-06-18 LAB — COMPREHENSIVE METABOLIC PANEL
AST: 15 U/L (ref 0–37)
Albumin: 3.1 g/dL — ABNORMAL LOW (ref 3.5–5.2)
Calcium: 8.7 mg/dL (ref 8.4–10.5)
Chloride: 102 mEq/L (ref 96–112)
Creatinine, Ser: 0.71 mg/dL (ref 0.50–1.10)

## 2012-06-18 LAB — HIV ANTIBODY (ROUTINE TESTING W REFLEX): HIV: NONREACTIVE

## 2012-06-18 LAB — POCT I-STAT 3, VENOUS BLOOD GAS (G3P V)
Acid-Base Excess: 3 mmol/L — ABNORMAL HIGH (ref 0.0–2.0)
Bicarbonate: 27.5 mEq/L — ABNORMAL HIGH (ref 20.0–24.0)
O2 Saturation: 62 %
TCO2: 29 mmol/L (ref 0–100)
pO2, Ven: 31 mmHg (ref 30.0–45.0)

## 2012-06-18 LAB — URINE MICROSCOPIC-ADD ON

## 2012-06-18 LAB — MRSA PCR SCREENING: MRSA by PCR: NEGATIVE

## 2012-06-18 MED ORDER — HEPARIN SODIUM (PORCINE) 5000 UNIT/ML IJ SOLN
5000.0000 [IU] | Freq: Three times a day (TID) | INTRAMUSCULAR | Status: DC
  Administered 2012-06-18 – 2012-06-22 (×14): 5000 [IU] via SUBCUTANEOUS
  Filled 2012-06-18 (×16): qty 1

## 2012-06-18 MED ORDER — ASPIRIN EC 81 MG PO TBEC
81.0000 mg | DELAYED_RELEASE_TABLET | Freq: Every day | ORAL | Status: DC
  Administered 2012-06-19 – 2012-06-22 (×4): 81 mg via ORAL
  Filled 2012-06-18 (×5): qty 1

## 2012-06-18 MED ORDER — ALBUTEROL SULFATE HFA 108 (90 BASE) MCG/ACT IN AERS: 2.0000 | INHALATION_SPRAY | Freq: Four times a day (QID) | RESPIRATORY_TRACT | Status: DC | PRN

## 2012-06-18 MED ORDER — DEXTROSE 5 % IV SOLN
1.0000 g | Freq: Three times a day (TID) | INTRAVENOUS | Status: DC
  Administered 2012-06-18 – 2012-06-19 (×3): 1 g via INTRAVENOUS
  Filled 2012-06-18 (×6): qty 1

## 2012-06-18 MED ORDER — DEXTROSE 5 % IV SOLN
500.0000 mg | Freq: Once | INTRAVENOUS | Status: AC
  Administered 2012-06-18: 500 mg via INTRAVENOUS
  Filled 2012-06-18 (×2): qty 500

## 2012-06-18 MED ORDER — DEXTROSE 5 % IV SOLN: 1.0000 g | INTRAVENOUS | Status: DC

## 2012-06-18 MED ORDER — MOMETASONE FURO-FORMOTEROL FUM 100-5 MCG/ACT IN AERO
2.0000 | INHALATION_SPRAY | Freq: Two times a day (BID) | RESPIRATORY_TRACT | Status: DC
  Administered 2012-06-18 – 2012-06-22 (×9): 2 via RESPIRATORY_TRACT
  Filled 2012-06-18 (×2): qty 8.8

## 2012-06-18 MED ORDER — VANCOMYCIN HCL IN DEXTROSE 1-5 GM/200ML-% IV SOLN
1000.0000 mg | Freq: Once | INTRAVENOUS | Status: AC
  Administered 2012-06-18: 1000 mg via INTRAVENOUS
  Filled 2012-06-18: qty 200

## 2012-06-18 MED ORDER — MOXIFLOXACIN HCL IN NACL 400 MG/250ML IV SOLN
400.0000 mg | INTRAVENOUS | Status: DC
  Administered 2012-06-18: 400 mg via INTRAVENOUS
  Filled 2012-06-18 (×2): qty 250

## 2012-06-18 MED ORDER — CYCLOSPORINE 0.05 % OP EMUL
1.0000 [drp] | Freq: Two times a day (BID) | OPHTHALMIC | Status: DC
  Administered 2012-06-18 – 2012-06-21 (×7): 1 [drp] via OPHTHALMIC
  Administered 2012-06-21: 11:00:00 via OPHTHALMIC
  Administered 2012-06-22: 1 [drp] via OPHTHALMIC
  Filled 2012-06-18 (×11): qty 1

## 2012-06-18 MED ORDER — DEXTROSE 5 % IV SOLN
2.0000 g | Freq: Three times a day (TID) | INTRAVENOUS | Status: DC
  Administered 2012-06-18: 2 g via INTRAVENOUS
  Filled 2012-06-18 (×3): qty 2

## 2012-06-18 MED ORDER — SODIUM CHLORIDE 0.9 % IV SOLN
INTRAVENOUS | Status: DC
  Administered 2012-06-18: 05:00:00 via INTRAVENOUS

## 2012-06-18 MED ORDER — METRONIDAZOLE IN NACL 5-0.79 MG/ML-% IV SOLN
500.0000 mg | Freq: Three times a day (TID) | INTRAVENOUS | Status: DC
  Filled 2012-06-18 (×2): qty 100

## 2012-06-18 MED ORDER — INSULIN ASPART 100 UNIT/ML ~~LOC~~ SOLN
0.0000 [IU] | Freq: Three times a day (TID) | SUBCUTANEOUS | Status: DC
  Administered 2012-06-18 – 2012-06-22 (×4): 2 [IU] via SUBCUTANEOUS

## 2012-06-18 MED ORDER — FUROSEMIDE 10 MG/ML IJ SOLN
10.0000 mg | Freq: Every day | INTRAMUSCULAR | Status: DC
  Administered 2012-06-18: 10 mg via INTRAVENOUS
  Filled 2012-06-18: qty 1

## 2012-06-18 MED ORDER — VANCOMYCIN HCL IN DEXTROSE 1-5 GM/200ML-% IV SOLN: 1000.0000 mg | INTRAVENOUS | Status: DC

## 2012-06-18 MED ORDER — PANTOPRAZOLE SODIUM 40 MG IV SOLR
40.0000 mg | Freq: Two times a day (BID) | INTRAVENOUS | Status: DC
  Administered 2012-06-18 – 2012-06-21 (×8): 40 mg via INTRAVENOUS
  Filled 2012-06-18 (×10): qty 40

## 2012-06-18 MED ORDER — INSULIN ASPART 100 UNIT/ML ~~LOC~~ SOLN
0.0000 [IU] | SUBCUTANEOUS | Status: DC
  Administered 2012-06-18 (×2): 2 [IU] via SUBCUTANEOUS

## 2012-06-18 MED ORDER — DEXTROSE 5 % IV SOLN
1.0000 g | Freq: Once | INTRAVENOUS | Status: AC
  Administered 2012-06-18: 1 g via INTRAVENOUS
  Filled 2012-06-18: qty 10

## 2012-06-18 MED ORDER — INSULIN DETEMIR 100 UNIT/ML ~~LOC~~ SOLN
15.0000 [IU] | Freq: Two times a day (BID) | SUBCUTANEOUS | Status: DC
  Administered 2012-06-18 – 2012-06-22 (×9): 15 [IU] via SUBCUTANEOUS
  Filled 2012-06-18 (×10): qty 0.15

## 2012-06-18 MED ORDER — ONDANSETRON HCL 4 MG/2ML IJ SOLN: 4.0000 mg | Freq: Three times a day (TID) | INTRAMUSCULAR | Status: DC | PRN

## 2012-06-18 MED ORDER — DULOXETINE HCL 60 MG PO CPEP
60.0000 mg | ORAL_CAPSULE | Freq: Every day | ORAL | Status: DC
  Administered 2012-06-18 – 2012-06-22 (×5): 60 mg via ORAL
  Filled 2012-06-18 (×5): qty 1

## 2012-06-18 MED ORDER — METOPROLOL TARTRATE 1 MG/ML IV SOLN
2.5000 mg | Freq: Four times a day (QID) | INTRAVENOUS | Status: DC
  Administered 2012-06-18: 2.5 mg via INTRAVENOUS
  Administered 2012-06-18: 05:00:00 via INTRAVENOUS
  Administered 2012-06-18 – 2012-06-19 (×4): 2.5 mg via INTRAVENOUS
  Filled 2012-06-18 (×9): qty 5

## 2012-06-18 NOTE — Progress Notes (Signed)
ANTIBIOTIC CONSULT NOTE - INITIAL  Pharmacy Consult for azactam, avelox Indication: aspiration PNA, UTI  Allergies  Allergen Reactions  . Penicillins Other (See Comments)    Redness and skin irritation    Patient Measurements: Height: 5' (152.4 cm) Weight: 148 lb 9.4 oz (67.4 kg) IBW/kg (Calculated) : 45.5  Vital Signs: Temp: 98.3 F (36.8 C) (05/29 0353) Temp src: Oral (05/29 0353) BP: 135/65 mmHg (05/29 1208) Pulse Rate: 86 (05/29 1208) Intake/Output from previous day:   Intake/Output from this shift:    Labs:  Recent Labs  06/17/12 2221 06/18/12 0217 06/18/12 0735 06/18/12 0745  WBC 16.6* 11.9*  --  10.9*  HGB 18.0* 16.5*  --  15.5*  PLT 182 160  --  166  CREATININE 0.87 0.66 0.71  --    Estimated Creatinine Clearance: 41.7 ml/min (by C-G formula based on Cr of 0.71). No results found for this basename: VANCOTROUGH, VANCOPEAK, VANCORANDOM, GENTTROUGH, GENTPEAK, GENTRANDOM, TOBRATROUGH, TOBRAPEAK, TOBRARND, AMIKACINPEAK, AMIKACINTROU, AMIKACIN,  in the last 72 hours    Medical History: Past Medical History  Diagnosis Date  . Hypertension   . Diabetes mellitus   . Dysrhythmia     a-fib  . Anemia   . Acute respiratory failure   . Chronic kidney disease     Kidney failure  . Allergic rhinitis   . Osteoarthrosis   . Osteoporosis   . Anxiety   . Depression   . Restless leg syndrome   . GERD (gastroesophageal reflux disease)   . Hypertonic bladder   . Peripheral vascular disease   . Coronary atherosclerosis due to lipid rich plaque   . DNR (do not resuscitate)     Medications:  Scheduled:  . aspirin EC  81 mg Oral Daily  . aztreonam  1 g Intravenous Q8H  . cycloSPORINE  1 drop Both Eyes BID  . DULoxetine  60 mg Oral Daily  . heparin  5,000 Units Subcutaneous Q8H  . insulin aspart  0-15 Units Subcutaneous TID WC  . insulin detemir  15 Units Subcutaneous BID  . metoprolol  2.5 mg Intravenous Q6H  . mometasone-formoterol  2 puff Inhalation BID   . moxifloxacin  400 mg Intravenous Q24H  . pantoprazole (PROTONIX) IV  40 mg Intravenous Q12H   Assessment: 79 yo female with aspiration PNA and UTI on antibiotics. (IV flagyl shortage patient allergies and patient unable to tolerate po).  Patient tp change antibiotics to azactam and avelox (discussed with Dr. Lendell Caprice).   Plan:  -Avelox 400mg  IV q24hr -Azactam 1gm IV q8hr -Possible plans to change to rocephin and po flagyl once able to tolerate po  Harland German, Pharm D 06/18/2012 2:32 PM

## 2012-06-18 NOTE — Progress Notes (Signed)
Utilization Review Completed.Shaundrea Carrigg T5/29/2014  

## 2012-06-18 NOTE — Care Management Note (Signed)
    Page 1 of 1   06/22/2012     4:45:27 PM   CARE MANAGEMENT NOTE 06/22/2012  Patient:  Shelia Webb, Shelia Webb   Account Number:  [id number]  Date Initiated:  06/18/2012  Documentation initiated by:  Kinley Ferrentino  Subjective/Objective Assessment:   PT ADM ON 06/17/12 WITH HCAP.  PTA, PT RESIDES AT [facility].     Action/Plan:   WILL CONSULT CSW TO FACILITATE RETURN TO SNF WHEN MEDICALLY STABLE FOR DC.   Anticipated DC Date:  06/22/2012   Anticipated DC Plan:  SKILLED NURSING FACILITY  In-house referral  Clinical Social Worker      DC Planning Services  CM consult      Choice offered to / List presented to:             Status of service:  Completed, signed off Medicare Important Message given?   (If response is "NO", the following Medicare IM given date fields will be blank) Date Medicare IM given:   Date Additional Medicare IM given:    Discharge Disposition:  SKILLED NURSING FACILITY  Per UR Regulation:  Reviewed for med. necessity/level of care/duration of stay  If discussed at Long Length of Stay Meetings, dates discussed:    Comments:  06/22/12 Artice Bergerson,RN,BSN [phone] PT DISCHARGING BACK TO SNF TODAY, PER CSW ARRANGEMENTS.

## 2012-06-18 NOTE — H&P (Addendum)
Hospitalist Admission History and Physical  Patient name: Shelia Webb Medical record number: [id number] Date of birth: [birth year] Age: 79 y.o. Gender: female  Primary Care Provider: Terald Sleeper, MD  Chief Complaint: emesis, HCAP  History of Present Illness:This is a 78 y.o. year old female with multiple medical problems including CVA, dysphagia, DM, esophageal strictures presenting with recurrent emesis. Level V caveat as pt is poor historian. Pt is a resident of [facility]. Per report, pt has had multiple episodes of brownish emesis over last 1-2 days. This has never happened before. Pt was apparently mildly confused later on in the day.  Pt was brought to ER for further evaluation. Pt had a WBC at 16.6, was well as bilateral PNA on imaging. A CT Abd and Pelvis was also obtained that distended stomach and multiple ventral hernias, but no signs of obstruction. Pt noted metabolic alkalosis on ABG. Lactate WNL. There some changes on UA concerning for UTI.    Patient Active Problem List   Diagnosis Date Noted  . CVA (cerebral infarction) 03/22/2011  . Dysphagia 03/22/2011  . TIA (transient ischemic attack) 02/15/2011  . Ventral hernia without mention of obstruction or gangrene 12/17/2010  . DM 03/30/2008  . RESTLESS LEG SYNDROME 03/30/2008  . DRY EYE SYNDROME 03/30/2008  . HYPERTENSION 03/30/2008  . CAD 03/30/2008  . PERIPHERAL VASCULAR DISEASE 03/30/2008  . VENOUS STASIS ULCER 03/30/2008  . HEMORRHOIDS, INTERNAL 03/30/2008  . GERD 03/30/2008  . OVERACTIVE BLADDER 03/30/2008  . OSTEOARTHRITIS 03/30/2008  . UNSTEADY GAIT 03/30/2008  . COLONIC POLYPS 12/20/2004  . ESOPHAGEAL STRICTURE 12/20/2004  . DIVERTICULOSIS, COLON 12/20/2004   Past Medical History: Past Medical History  Diagnosis Date  . Hypertension   . Diabetes mellitus   . Dysrhythmia     a-fib  . Anemia   . Acute respiratory failure   . Chronic kidney disease     Kidney failure  . Allergic rhinitis   .  Osteoarthrosis   . Osteoporosis   . Anxiety   . Depression   . Restless leg syndrome   . GERD (gastroesophageal reflux disease)   . Hypertonic bladder   . Peripheral vascular disease   . Coronary atherosclerosis due to lipid rich plaque   . DNR (do not resuscitate)     Past Surgical History: Past Surgical History  Procedure Laterality Date  . Colon surgery      has colostomy  . Abdominal hysterectomy    . Appendectomy    . Tonsillectomy    . Cardiac catheterization      bilateral cataract surgery  . Diaphragmatic hernia repair      with gangrene  . Orif patella  12/07/2010    Procedure: OPEN REDUCTION INTERNAL (ORIF) FIXATION PATELLA;  Surgeon: Nadara Mustard, MD;  Location: [facility] OR;  Service: Orthopedics;  Laterality: Left;  Left Knee Hamstring Release and Open Reduction Internal Fixation Patella  . Orif patella  12/21/2010    Procedure: OPEN REDUCTION INTERNAL (ORIF) FIXATION PATELLA;  Surgeon: Nadara Mustard, MD;  Location: [facility] OR;  Service: Orthopedics;  Laterality: Left;  Left Knee Hamstring Release and Open Reduction Internal Fixation Patella    Social History: History   Social History  . Marital Status: Widowed    Spouse Name: N/A    Number of Children: N/A  . Years of Education: N/A   Social History Main Topics  . Smoking status: Never Smoker   . Smokeless tobacco: Never Used  . Alcohol Use: No  .  Drug Use: No  . Sexually Active:    Other Topics Concern  . None   Social History Narrative  . None    Family History: No family history on file.  Allergies: Allergies  Allergen Reactions  . Penicillins Other (See Comments)    Redness and skin irritation    Current Facility-Administered Medications  Medication Dose Route Frequency Provider Last Rate Last Dose  . 0.9 %  sodium chloride infusion   Intravenous Continuous Toy Baker, MD 125 mL/hr at 06/18/12 0210    . albuterol (PROVENTIL HFA;VENTOLIN HFA) 108 (90 BASE) MCG/ACT inhaler 2 puff  2 puff  Inhalation Q6H PRN Doree Albee, MD      . aspirin EC tablet 81 mg  81 mg Oral Daily Doree Albee, MD      . azithromycin (ZITHROMAX) 500 mg in dextrose 5 % 250 mL IVPB  500 mg Intravenous Once Brandt Loosen, MD      . aztreonam (AZACTAM) 2 g in dextrose 5 % 50 mL IVPB  2 g Intravenous Q8H Doree Albee, MD      . cefTRIAXone (ROCEPHIN) 1 g in dextrose 5 % 50 mL IVPB  1 g Intravenous Once Brandt Loosen, MD 100 mL/hr at 06/18/12 0210 1 g at 06/18/12 0210  . cycloSPORINE (RESTASIS) 0.05 % ophthalmic emulsion 1 drop  1 drop Both Eyes BID Doree Albee, MD      . furosemide (LASIX) injection 10 mg  10 mg Intravenous Daily Doree Albee, MD      . heparin injection 5,000 Units  5,000 Units Subcutaneous Q8H Doree Albee, MD      . insulin aspart (novoLOG) injection 0-15 Units  0-15 Units Subcutaneous Q4H Doree Albee, MD      . insulin detemir (LEVEMIR) injection 15 Units  15 Units Subcutaneous BID Doree Albee, MD      . metoprolol (LOPRESSOR) injection 2.5 mg  2.5 mg Intravenous Q6H Doree Albee, MD      . mometasone-formoterol [facility]) 100-5 MCG/ACT inhaler 2 puff  2 puff Inhalation BID Doree Albee, MD      . pantoprazole (PROTONIX) injection 40 mg  40 mg Intravenous Q12H Doree Albee, MD      . vancomycin (VANCOCIN) IVPB 1000 mg/200 mL premix  1,000 mg Intravenous Once Brandt Loosen, MD      . Melene Muller ON 06/19/2012] vancomycin (VANCOCIN) IVPB 1000 mg/200 mL premix  1,000 mg Intravenous Q24H Colleen Can, [facility]       Current Outpatient Prescriptions  Medication Sig Dispense Refill  . albuterol (PROVENTIL HFA;VENTOLIN HFA) 108 (90 BASE) MCG/ACT inhaler Inhale 2 puffs into the lungs every 6 (six) hours as needed for wheezing.      . Artificial Tear Ointment (LACRI-LUBE OP) Place 1 drop into both eyes at bedtime.       Marland Kitchen aspirin EC 81 MG tablet Take 81 mg by mouth daily.      . baclofen (LIORESAL) 5 mg TABS Take 5 mg by mouth 3 (three) times daily as needed (neck spasms).      .  calcium-vitamin D (OSCAL WITH D) 500-200 MG-UNIT per tablet Take 1 tablet by mouth daily.        . cycloSPORINE (RESTASIS) 0.05 % ophthalmic emulsion Place 1 drop into both eyes 2 (two) times daily.        . diclofenac sodium (VOLTAREN) 1 % GEL Apply 1 application topically 4 (four) times daily as needed (knee pain). For both knees      .  DULoxetine (CYMBALTA) 60 MG capsule Take 60 mg by mouth daily.        . Fluticasone-Salmeterol (ADVAIR) 100-50 MCG/DOSE AEPB Inhale 1 puff into the lungs daily.       . furosemide (LASIX) 20 MG tablet Take 20 mg by mouth daily.        Marland Kitchen gabapentin (NEURONTIN) 300 MG capsule Take 300 mg by mouth 2 (two) times daily.      . insulin aspart (NOVOLOG) 100 UNIT/ML injection Inject 10 Units into the skin 3 (three) times daily before meals. Do Not give at bedtime      . insulin detemir (LEVEMIR) 100 UNIT/ML injection Inject 15-20 Units into the skin 2 (two) times daily. 15units at bedtime 20 units daily      . methylcellulose (ARTIFICIAL TEARS) 1 % ophthalmic solution Place 1 drop into both eyes 4 (four) times daily.      . metoprolol succinate (TOPROL-XL) 25 MG 24 hr tablet Take 25 mg by mouth daily.        . Multiple Vitamins-Minerals (MULTIVITAMINS THER. W/MINERALS) TABS Take 1 tablet by mouth daily.        Marland Kitchen omeprazole (PRILOSEC OTC) 20 MG tablet Take 20 mg by mouth daily.      Marland Kitchen oxyCODONE-acetaminophen (PERCOCET) 5-325 MG per tablet Take 1 tablet by mouth every 6 (six) hours as needed. For pain  10 tablet  0  . promethazine (PHENERGAN) 25 MG suppository Place 25 mg rectally every 6 (six) hours as needed for nausea.      Marland Kitchen rOPINIRole (REQUIP) 0.25 MG tablet Take 0.25 mg by mouth 2 (two) times daily.        Marland Kitchen senna (SENOKOT) 8.6 MG TABS Take 1 tablet by mouth at bedtime.        . traZODone (DESYREL) 50 MG tablet Take 50 mg by mouth at bedtime. For insomnia      . Vitamin D, Ergocalciferol, (DRISDOL) 50000 UNITS CAPS Take 50,000 Units by mouth every 7 (seven) days.  mondays       Review Of Systems: 12 point ROS negative except as noted above in HPI.  Physical Exam: Filed Vitals:   06/18/12 0200  BP: 133/68  Pulse:   Temp:   Resp: 30    General: obese, mildly cooperative to exam  HEENT: PERRLA and extra ocular movement intact Heart: S1, S2 normal, no murmur, rub or gallop, regular rate and rhythm Lungs: decreased resp effort, no focal auscultatable defects  Abdomen: obese abdomen, non tender, mild distension, ostomy CDI  Extremities: extremities normal, atraumatic, no cyanosis or edema Skin:no rashes, no ecchymoses Neurology: diffusely weak, otherwise grossly normal exam  Labs and Imaging: Lab Results  Component Value Date/Time   NA 143 06/17/2012 10:21 PM   K 4.1 06/17/2012 10:21 PM   CL 100 06/17/2012 10:21 PM   CO2 31 06/17/2012 10:21 PM   BUN 14 06/17/2012 10:21 PM   CREATININE 0.87 06/17/2012 10:21 PM   GLUCOSE 118* 06/17/2012 10:21 PM   Lab Results  Component Value Date   WBC 16.6* 06/17/2012   HGB 18.0* 06/17/2012   HCT 52.0* 06/17/2012   MCV 88.3 06/17/2012   PLT 182 06/17/2012    Ct Abdomen Pelvis Wo Contrast  06/18/2012   *RADIOLOGY REPORT*  Clinical Data: Feculent emesis; history of multiple abdominal surgeries.  CT ABDOMEN AND PELVIS WITHOUT CONTRAST  Technique:  Multidetector CT imaging of the abdomen and pelvis was performed following the standard protocol without intravenous contrast.  Comparison: CT  of the abdomen and pelvis from 01/20/2010, and abdominal radiographs performed earlier today at 10:49 p.m.  Findings: Right basilar airspace opacity raises concern for pneumonia; this was not well characterized on the recent chest radiograph.  Diffuse coronary artery calcifications are seen.  There is mild distension of the stomach with fluid and air.  The duodenum is unremarkable in appearance, containing a small amount of fluid.  Scattered air and fluid are seen filling the jejunum and ileum, without evidence of bowel dilatation to  suggest obstruction. Visualized small bowel loops remain normal in caliber.  As on the prior study, there are three anterior abdominal wall hernias.  The largest reflects the patient's left mid abdominal colostomy; an associated long segment of herniated small bowel is seen.  There is also a long segment of small bowel within a broad- based right lower quadrant anterior abdominal wall hernia.  In addition, a small hernia is seen to the left of this hernia, slightly increased in size from the prior study and again containing a relatively short segment of ileum.  There is no evidence of strangulation at this time.  No significant soft tissue inflammation is seen with respect to any of these hernias.  A broader bulge is noted to the right of the colostomy hernia, also grossly unchanged from the prior study.  The remaining colon is grossly unremarkable, containing a small amount of fluid.  Residual stool is noted within the patient's Hartmann's pouch.  The liver and spleen are unremarkable in appearance.  The gallbladder is within normal limits.  There is diffuse atrophy of the pancreas.  The adrenal glands are grossly unremarkable in appearance.  A 1.8 cm stone is noted at the interpole region of the left kidney; there is a 0.8 cm stone at the interpole region of the right kidney.  Large bilateral renal cysts are seen, measuring up to 8.3 cm in size.  Mild nonspecific perinephric stranding is noted bilaterally.  There is no evidence of hydronephrosis.  No obstructing ureteral stones are seen.  No significant free fluid is seen within the abdomen or pelvis.  No acute vascular abnormalities are seen.  Scattered calcification is noted along the abdominal aorta and its branches.  The bladder is mildly distended and grossly unremarkable in appearance.  The patient is status post hysterectomy.  The ovaries are grossly symmetric; no suspicious adnexal masses are seen.  No inguinal lymphadenopathy is seen.  No acute osseous  abnormalities are identified.  Multilevel vacuum phenomenon is noted along the lumbar spine.  IMPRESSION:  1.  Right basilar airspace opacity, concerning for pneumonia. 2.  Mild distension of the stomach with fluid and air, without evidence of distal obstruction.  The duodenum is largely decompressed and grossly unremarkable in appearance.  No evidence of more distal small bowel obstruction; visualized small bowel loops are partially filled with fluid and appear grossly unremarkable. 3.  As on the prior study, there are three anterior abdominal wall hernias containing loops of small bowel, the largest of which involves the patient's left-sided colostomy.  No evidence of strangulation; no associated soft tissue inflammation seen. 4.  Bilateral renal stones again seen, measuring up to 1.8 cm on the left side.  Bilateral renal cysts noted, measuring up to 8.3 cm in size. 5.  Scattered calcification along the abdominal aorta and its branches. 6.  Diffuse coronary artery calcifications seen.   Original Report Authenticated By: Tonia Ghent, M.D.   Dg Chest Portable 1 View  06/18/2012   *RADIOLOGY  REPORT*  Clinical Data: Vomiting; shortness of breath.  PORTABLE CHEST - 1 VIEW  Comparison: Chest radiograph performed earlier today at 10:49 p.m.  Findings: The lungs are mildly hypoexpanded.  Mild chronic peribronchial thickening is again seen.  No focal consolidation, pleural effusion or pneumothorax is identified.  The cardiomediastinal silhouette is borderline enlarged.  No acute osseous abnormalities are seen.  IMPRESSION: Lungs mildly hypoexpanded; no acute cardiopulmonary process seen. Mild chronic peribronchial thickening noted; borderline cardiomegaly.   Original Report Authenticated By: Tonia Ghent, M.D.   Dg Abd Acute W/chest  06/17/2012   *RADIOLOGY REPORT*  Clinical Data: Diffuse abdominal pain.  ACUTE ABDOMEN SERIES (ABDOMEN 2 VIEW & CHEST 1 VIEW)  Comparison: Chest radiograph performed 03/13/2012, and  CT of the abdomen and pelvis performed 01/20/2010  Findings: The lungs are well-aerated.  Mild chronic peribronchial thickening is seen.  There is no evidence of focal opacification, pleural effusion or pneumothorax.  The cardiomediastinal silhouette is borderline normal in size.  The visualized bowel gas pattern is unremarkable.  Scattered fluid and air are seen within the colon; there is no evidence of small bowel dilatation to suggest obstruction. Scattered small air-fluid levels on the decubitus view likely remain within normal limits, without evidence of obstruction.  No free intra-abdominal air is identified on the provided decubitus view.  The stomach is mildly distended with air and fluid.  No acute osseous abnormalities are seen; the sacroiliac joints are unremarkable in appearance.  IMPRESSION:  1.  Unremarkable bowel gas pattern; no free intra-abdominal air seen.  The stomach is mildly distended with fluid and air. 2.  Mild chronic peribronchial thickening seen; lungs otherwise clear.   Original Report Authenticated By: Tonia Ghent, M.D.     Assessment and Plan: ALYRA PATTY is a 79 y.o. year old female presenting with recurrent emesis and HCAP  HCAP: Will place on vanc and aztreonam for coverage. Likely aspiration as etiology. Blood urine, sputum culture. Urine strep and legionella. Should also give good coverage for UTI.   Emesis: Broad differential for this including subacute SBO, gastroenteritis, gastroparesis in setting of DM. Will place NG tube to help decompress stomach. Gastroccult contents. High dose PPI. Hold oral meds in interim. Bedside swallow eval once appropriate. ? GI consult vs. Formal swallow study if sxs persist.   Metabolic Alkalosis: Likely 2/2 vomiting. Gently hydrate and reassess.   UTI: Urine culture pending. Aztreonam should give good coverage pending culture.   Kidney stone: noted large renal stone up to 1.8 cm on L sided. Clinically asymptomatic currently, but  kidney stone may be nidus for infection. Consider urology c/s if indicated.     HTN: IV metoprolol. Follow BP curve.   DM: SSI, A1c  Asthma: continue advair     FEN/GI: pending swallow eval. High dose PPI.  Prophylaxis: subq hep pending gastroccult  Disposition: pending further evaluation  Code Status:DNR        Doree Albee MD  Pager: [phone]

## 2012-06-18 NOTE — ED Notes (Signed)
FSBS 124

## 2012-06-18 NOTE — Progress Notes (Signed)
Chart reviewed.  Discussed with RN.  [facility] PROGRESS NOTE  Shelia Webb ZOX:[id number] DOB: [birth year] DOA: 06/17/2012 PCP: Terald Sleeper, MD  Assessment/Plan: Active Problems:   Aspiration pneumonia   DM   CAD   GERD   CVA (cerebral infarction)   Dysphagia   UTI (urinary tract infection)   Dehydration  polypharmacy  Given history and location of infiltrate (right lower lobe), pneumonia is likely aspiration related. Will discontinue vancomycin. Needs anaerobic coverage. There is a shortage of Flagyl. Would like to avoid clindamycin do to risk of C. difficile. Pharmacy has a few doses of IV Avelox. Once able to take anything by mouth, can switch to oral Flagyl. Aztreonam should cover urine pathogens. Await cultures. Previously, culture results have been multiple bacterial morphotypes. Will start a diet carefully. Patient has a history of dysphagia and is on pured with honey thickened liquids. Profoundly dehydrated. Continue IV fluid  Code Status: DNR Family Communication: son, Shelia Webb via phone Disposition Plan: SNF   Consultants:    Procedures:    Antibiotics:  Aztreonam  vancomycin  HPI/Subjective: unable  Objective: Filed Vitals:   06/18/12 0245 06/18/12 0300 06/18/12 0353 06/18/12 1208  BP: 134/67 135/64 149/77 135/65  Pulse: 86 89 89 86  Temp:   98.3 F (36.8 C)   TempSrc:   Oral   Resp: 27 26 18    Height:      Weight:   67.4 kg (148 lb 9.4 oz)   SpO2: 95% 91% 94%    No intake or output data in the 24 hours ending 06/18/12 1323 Filed Weights   06/18/12 0200 06/18/12 0353  Weight: 67.1 kg (147 lb 14.9 oz) 67.4 kg (148 lb 9.4 oz)    Exam:   General:  Eyes open, but poorly responsive  HEENT:  Edentulous. Mouth open. Extremely dry mucous membranes  Cardiovascular: RRR without MGR  Respiratory: CTA without WRR  Abdomen: Bowel sounds present. Colostomy bag in place. Multiple surgical scars. Soft. Nontender  Extremities:  No clubbing cyanosis. Muscle wasting present with mild contractures in the lower extremities  Data Reviewed: Basic Metabolic Panel:  Recent Labs Lab 06/17/12 2221 06/18/12 0217 06/18/12 0735  NA 143  --  138  K 4.1  --  3.7  CL 100  --  102  CO2 31  --  22  GLUCOSE 118*  --  155*  BUN 14  --  15  CREATININE 0.87 0.66 0.71  CALCIUM 10.2  --  8.7   Liver Function Tests:  Recent Labs Lab 06/17/12 2221 06/18/12 0735  AST 21 15  ALT 17 13  ALKPHOS 106 85  BILITOT 0.4 0.5  PROT 8.4* 6.7  ALBUMIN 4.0 3.1*    Recent Labs Lab 06/17/12 2221  LIPASE 10*   No results found for this basename: AMMONIA,  in the last 168 hours CBC:  Recent Labs Lab 06/17/12 2221 06/18/12 0217 06/18/12 0745  WBC 16.6* 11.9* 10.9*  NEUTROABS 13.6*  --  7.8*  HGB 18.0* 16.5* 15.5*  HCT 52.0* 48.0* 45.9  MCV 88.3 88.6 89.5  PLT 182 160 166   Cardiac Enzymes: No results found for this basename: CKTOTAL, CKMB, CKMBINDEX, TROPONINI,  in the last 168 hours BNP (last 3 results) No results found for this basename: PROBNP,  in the last 8760 hours CBG:  Recent Labs Lab 06/18/12 0026 06/18/12 0403 06/18/12 1200  GLUCAP 124* 142* 141*    Recent Results (from the past 240 hour(s))  MRSA PCR  SCREENING     Status: None   Collection Time    06/18/12  4:02 AM      Result Value Range Status   MRSA by PCR NEGATIVE  NEGATIVE Final   Comment:            The GeneXpert MRSA Assay (FDA     approved for NASAL specimens     only), is one component of a     comprehensive MRSA colonization     surveillance program. It is not     intended to diagnose MRSA     infection nor to guide or     monitor treatment for     MRSA infections.     Studies: Ct Abdomen Pelvis Wo Contrast  06/18/2012   *RADIOLOGY REPORT*  Clinical Data: Feculent emesis; history of multiple abdominal surgeries.  CT ABDOMEN AND PELVIS WITHOUT CONTRAST  Technique:  Multidetector CT imaging of the abdomen and pelvis was performed  following the standard protocol without intravenous contrast.  Comparison: CT of the abdomen and pelvis from 01/20/2010, and abdominal radiographs performed earlier today at 10:49 p.m.  Findings: Right basilar airspace opacity raises concern for pneumonia; this was not well characterized on the recent chest radiograph.  Diffuse coronary artery calcifications are seen.  There is mild distension of the stomach with fluid and air.  The duodenum is unremarkable in appearance, containing a small amount of fluid.  Scattered air and fluid are seen filling the jejunum and ileum, without evidence of bowel dilatation to suggest obstruction. Visualized small bowel loops remain normal in caliber.  As on the prior study, there are three anterior abdominal wall hernias.  The largest reflects the patient's left mid abdominal colostomy; an associated long segment of herniated small bowel is seen.  There is also a long segment of small bowel within a broad- based right lower quadrant anterior abdominal wall hernia.  In addition, a small hernia is seen to the left of this hernia, slightly increased in size from the prior study and again containing a relatively short segment of ileum.  There is no evidence of strangulation at this time.  No significant soft tissue inflammation is seen with respect to any of these hernias.  A broader bulge is noted to the right of the colostomy hernia, also grossly unchanged from the prior study.  The remaining colon is grossly unremarkable, containing a small amount of fluid.  Residual stool is noted within the patient's Hartmann's pouch.  The liver and spleen are unremarkable in appearance.  The gallbladder is within normal limits.  There is diffuse atrophy of the pancreas.  The adrenal glands are grossly unremarkable in appearance.  A 1.8 cm stone is noted at the interpole region of the left kidney; there is a 0.8 cm stone at the interpole region of the right kidney.  Large bilateral renal cysts are  seen, measuring up to 8.3 cm in size.  Mild nonspecific perinephric stranding is noted bilaterally.  There is no evidence of hydronephrosis.  No obstructing ureteral stones are seen.  No significant free fluid is seen within the abdomen or pelvis.  No acute vascular abnormalities are seen.  Scattered calcification is noted along the abdominal aorta and its branches.  The bladder is mildly distended and grossly unremarkable in appearance.  The patient is status post hysterectomy.  The ovaries are grossly symmetric; no suspicious adnexal masses are seen.  No inguinal lymphadenopathy is seen.  No acute osseous abnormalities are identified.  Multilevel  vacuum phenomenon is noted along the lumbar spine.  IMPRESSION:  1.  Right basilar airspace opacity, concerning for pneumonia. 2.  Mild distension of the stomach with fluid and air, without evidence of distal obstruction.  The duodenum is largely decompressed and grossly unremarkable in appearance.  No evidence of more distal small bowel obstruction; visualized small bowel loops are partially filled with fluid and appear grossly unremarkable. 3.  As on the prior study, there are three anterior abdominal wall hernias containing loops of small bowel, the largest of which involves the patient's left-sided colostomy.  No evidence of strangulation; no associated soft tissue inflammation seen. 4.  Bilateral renal stones again seen, measuring up to 1.8 cm on the left side.  Bilateral renal cysts noted, measuring up to 8.3 cm in size. 5.  Scattered calcification along the abdominal aorta and its branches. 6.  Diffuse coronary artery calcifications seen.   Original Report Authenticated By: Tonia Ghent, M.D.   Dg Chest Portable 1 View  06/18/2012   *RADIOLOGY REPORT*  Clinical Data: Vomiting; shortness of breath.  PORTABLE CHEST - 1 VIEW  Comparison: Chest radiograph performed earlier today at 10:49 p.m.  Findings: The lungs are mildly hypoexpanded.  Mild chronic peribronchial  thickening is again seen.  No focal consolidation, pleural effusion or pneumothorax is identified.  The cardiomediastinal silhouette is borderline enlarged.  No acute osseous abnormalities are seen.  IMPRESSION: Lungs mildly hypoexpanded; no acute cardiopulmonary process seen. Mild chronic peribronchial thickening noted; borderline cardiomegaly.   Original Report Authenticated By: Tonia Ghent, M.D.   Dg Abd Acute W/chest  06/17/2012   *RADIOLOGY REPORT*  Clinical Data: Diffuse abdominal pain.  ACUTE ABDOMEN SERIES (ABDOMEN 2 VIEW & CHEST 1 VIEW)  Comparison: Chest radiograph performed 03/13/2012, and CT of the abdomen and pelvis performed 01/20/2010  Findings: The lungs are well-aerated.  Mild chronic peribronchial thickening is seen.  There is no evidence of focal opacification, pleural effusion or pneumothorax.  The cardiomediastinal silhouette is borderline normal in size.  The visualized bowel gas pattern is unremarkable.  Scattered fluid and air are seen within the colon; there is no evidence of small bowel dilatation to suggest obstruction. Scattered small air-fluid levels on the decubitus view likely remain within normal limits, without evidence of obstruction.  No free intra-abdominal air is identified on the provided decubitus view.  The stomach is mildly distended with air and fluid.  No acute osseous abnormalities are seen; the sacroiliac joints are unremarkable in appearance.  IMPRESSION:  1.  Unremarkable bowel gas pattern; no free intra-abdominal air seen.  The stomach is mildly distended with fluid and air. 2.  Mild chronic peribronchial thickening seen; lungs otherwise clear.   Original Report Authenticated By: Tonia Ghent, M.D.    Scheduled Meds: . aspirin EC  81 mg Oral Daily  . aztreonam  2 g Intravenous Q8H  . cycloSPORINE  1 drop Both Eyes BID  . furosemide  10 mg Intravenous Daily  . heparin  5,000 Units Subcutaneous Q8H  . insulin aspart  0-15 Units Subcutaneous Q4H  . insulin  detemir  15 Units Subcutaneous BID  . metoprolol  2.5 mg Intravenous Q6H  . mometasone-formoterol  2 puff Inhalation BID  . pantoprazole (PROTONIX) IV  40 mg Intravenous Q12H  . [START ON 06/19/2012] vancomycin  1,000 mg Intravenous Q24H   Continuous Infusions: . sodium chloride 125 mL/hr at 06/18/12 0210  . sodium chloride 50 mL/hr at 06/18/12 0438   Time spent: 40 min  Maicee Ullman L  [facility] Pager [phone]. If 7PM-7AM, please contact night-coverage at [email or web address], password [facility] 06/18/2012, 1:23 PM  LOS: 1 day

## 2012-06-18 NOTE — ED Notes (Signed)
Colostomy bag emptied.

## 2012-06-18 NOTE — ED Notes (Signed)
Report given to floor rn. Pt transported via stretcher to floor.  nadn.

## 2012-06-18 NOTE — ED Notes (Signed)
Admitting md at bedside

## 2012-06-18 NOTE — Progress Notes (Signed)
ANTIBIOTIC CONSULT NOTE - INITIAL  Pharmacy Consult for vancomycin Indication: rule out pneumonia  Allergies  Allergen Reactions  . Penicillins Other (See Comments)    Redness and skin irritation    Patient Measurements: Height: 5' (152.4 cm) Weight: 147 lb 14.9 oz (67.1 kg) IBW/kg (Calculated) : 45.5  Vital Signs: Temp: 97.8 F (36.6 C) (05/28 2213) Temp src: Oral (05/28 2213) BP: 133/68 mmHg (05/29 0200) Pulse Rate: 89 (05/29 0115)  Labs:  Recent Labs  06/17/12 2221  WBC 16.6*  HGB 18.0*  PLT 182  CREATININE 0.87   Estimated Creatinine Clearance: 38.2 ml/min (by C-G formula based on Cr of 0.87).   Microbiology: No results found for this or any previous visit (from the past 720 hour(s)).  Medical History: Past Medical History  Diagnosis Date  . Hypertension   . Diabetes mellitus   . Dysrhythmia     a-fib  . Anemia   . Acute respiratory failure   . Chronic kidney disease     Kidney failure  . Allergic rhinitis   . Osteoarthrosis   . Osteoporosis   . Anxiety   . Depression   . Restless leg syndrome   . GERD (gastroesophageal reflux disease)   . Hypertonic bladder   . Peripheral vascular disease   . Coronary atherosclerosis due to lipid rich plaque   . DNR (do not resuscitate)     Assessment: 79yo female comes from SNF c/o large volume of brown emesis x2 episodes, CT concerning for PNA, to begin IV ABX.  Goal of Therapy:  Vancomycin trough level 15-20 mcg/ml  Plan:  Will begin vancomycin 1g IV Q24H and monitor CBC, Cx, levels prn.  Vernard Gambles, PharmD, BCPS  06/18/2012,2:05 AM

## 2012-06-19 LAB — GLUCOSE, CAPILLARY: Glucose-Capillary: 134 mg/dL — ABNORMAL HIGH (ref 70–99)

## 2012-06-19 MED ORDER — NON FORMULARY: 400.0000 mg | Freq: Every morning | Status: DC

## 2012-06-19 MED ORDER — METOPROLOL SUCCINATE ER 25 MG PO TB24
25.0000 mg | ORAL_TABLET | Freq: Every day | ORAL | Status: DC
  Administered 2012-06-19 – 2012-06-22 (×4): 25 mg via ORAL
  Filled 2012-06-19 (×4): qty 1

## 2012-06-19 MED ORDER — OXYCODONE HCL 5 MG PO TABS
ORAL_TABLET | ORAL | Status: AC
  Filled 2012-06-19: qty 1

## 2012-06-19 MED ORDER — MOXIFLOXACIN HCL 400 MG PO TABS
400.0000 mg | ORAL_TABLET | ORAL | Status: DC
  Administered 2012-06-19 – 2012-06-21 (×3): 400 mg via ORAL
  Filled 2012-06-19 (×4): qty 1

## 2012-06-19 MED ORDER — VANCOMYCIN HCL 10 G IV SOLR
1250.0000 mg | INTRAVENOUS | Status: DC
  Administered 2012-06-19 – 2012-06-20 (×2): 1250 mg via INTRAVENOUS
  Filled 2012-06-19 (×3): qty 1250

## 2012-06-19 NOTE — Progress Notes (Signed)
ANTIBIOTIC CONSULT NOTE - INITIAL  Pharmacy Consult for Vancomycin Indication: Enterococcus UTI  Allergies  Allergen Reactions  . Penicillins Other (See Comments)    Redness and skin irritation    Patient Measurements: Height: 5' (152.4 cm) Weight: 148 lb 9.4 oz (67.4 kg) IBW/kg (Calculated) : 45.5  Vital Signs: Temp: 97.4 F (36.3 C) (05/30 1434) Temp src: Oral (05/30 1434) BP: 152/85 mmHg (05/30 1434) Pulse Rate: 102 (05/30 1434) Intake/Output from previous day: 05/29 0701 - 05/30 0700 In: 2692.9 [P.O.:120; I.V.:2322.9; IV Piggyback:250] Out: -  Intake/Output from this shift: Total I/O In: 1792.1 [P.O.:240; I.V.:1402.1; IV Piggyback:150] Out: -   Labs:  Recent Labs  06/17/12 2221 06/18/12 0217 06/18/12 0735 06/18/12 0745  WBC 16.6* 11.9*  --  10.9*  HGB 18.0* 16.5*  --  15.5*  PLT 182 160  --  166  CREATININE 0.87 0.66 0.71  --    Estimated Creatinine Clearance: 41.7 ml/min (by C-G formula based on Cr of 0.71). No results found for this basename: VANCOTROUGH, Leodis Binet, VANCORANDOM, GENTTROUGH, GENTPEAK, GENTRANDOM, TOBRATROUGH, TOBRAPEAK, TOBRARND, AMIKACINPEAK, AMIKACINTROU, AMIKACIN,  in the last 72 hours   Microbiology: Recent Results (from the past 720 hour(s))  URINE CULTURE     Status: None   Collection Time    06/18/12 12:47 AM      Result Value Range Status   Specimen Description URINE, CATHETERIZED   Final   Special Requests NONE   Final   Culture  Setup Time 06/18/2012 10:48   Final   Colony Count >=100,000 COLONIES/ML   Final   Culture ENTEROCOCCUS SPECIES   Final   Report Status PENDING   Incomplete  CULTURE, BLOOD (ROUTINE X 2)     Status: None   Collection Time    06/18/12  1:30 AM      Result Value Range Status   Specimen Description BLOOD RIGHT ARM   Final   Special Requests BOTTLES DRAWN AEROBIC AND ANAEROBIC 10CC   Final   Culture  Setup Time 06/18/2012 10:19   Final   Culture     Final   Value:        BLOOD CULTURE RECEIVED NO  GROWTH TO DATE CULTURE WILL BE HELD FOR 5 DAYS BEFORE ISSUING A FINAL NEGATIVE REPORT   Report Status PENDING   Incomplete  CULTURE, BLOOD (ROUTINE X 2)     Status: None   Collection Time    06/18/12  1:40 AM      Result Value Range Status   Specimen Description BLOOD RIGHT HAND   Final   Special Requests BOTTLES DRAWN AEROBIC ONLY 4CC   Final   Culture  Setup Time 06/18/2012 10:17   Final   Culture     Final   Value:        BLOOD CULTURE RECEIVED NO GROWTH TO DATE CULTURE WILL BE HELD FOR 5 DAYS BEFORE ISSUING A FINAL NEGATIVE REPORT   Report Status PENDING   Incomplete  MRSA PCR SCREENING     Status: None   Collection Time    06/18/12  4:02 AM      Result Value Range Status   MRSA by PCR NEGATIVE  NEGATIVE Final   Comment:            The GeneXpert MRSA Assay (FDA     approved for NASAL specimens     only), is one component of a     comprehensive MRSA colonization     surveillance program. It is  not     intended to diagnose MRSA     infection nor to guide or     monitor treatment for     MRSA infections.    Anti-infectives   Start     Dose/Rate Route Frequency Ordered Stop   06/19/12 0200  cefTRIAXone (ROCEPHIN) 1 g in dextrose 5 % 50 mL IVPB  Status:  Discontinued     1 g 100 mL/hr over 30 Minutes Intravenous Every 24 hours 06/18/12 1328 06/18/12 1424   06/19/12 0000  vancomycin (VANCOCIN) IVPB 1000 mg/200 mL premix  Status:  Discontinued     1,000 mg 200 mL/hr over 60 Minutes Intravenous Every 24 hours 06/18/12 0205 06/18/12 1328   06/18/12 1600  moxifloxacin (AVELOX) IVPB 400 mg     400 mg 250 mL/hr over 60 Minutes Intravenous Every 24 hours 06/18/12 1426     06/18/12 1600  aztreonam (AZACTAM) 1 g in dextrose 5 % 50 mL IVPB     1 g 100 mL/hr over 30 Minutes Intravenous 3 times per day 06/18/12 1426     06/18/12 1400  metroNIDAZOLE (FLAGYL) IVPB 500 mg  Status:  Discontinued     500 mg 100 mL/hr over 60 Minutes Intravenous Every 8 hours 06/18/12 1328 06/18/12 1426    06/18/12 0600  aztreonam (AZACTAM) 2 g in dextrose 5 % 50 mL IVPB  Status:  Discontinued     2 g 100 mL/hr over 30 Minutes Intravenous 3 times per day 06/18/12 0203 06/18/12 1328   06/18/12 0115  vancomycin (VANCOCIN) IVPB 1000 mg/200 mL premix     1,000 mg 200 mL/hr over 60 Minutes Intravenous  Once 06/18/12 0105 06/18/12 0539   06/18/12 0115  cefTRIAXone (ROCEPHIN) 1 g in dextrose 5 % 50 mL IVPB     1 g 100 mL/hr over 30 Minutes Intravenous  Once 06/18/12 0105 06/18/12 0240   06/18/12 0115  azithromycin (ZITHROMAX) 500 mg in dextrose 5 % 250 mL IVPB     500 mg 250 mL/hr over 60 Minutes Intravenous  Once 06/18/12 0105 06/18/12 1239      Assessment: 79 y/o female SNF resident on day 3 antibiotics to cover for aspiration PNA and Enterococcus UTI. Pharmacy consulted to begin vancomycin for UTI until final culture results are back. Spoke with Dr. Lendell Caprice regarding antibiotics.  Vanc 5/29>>5/29, 5/30>> azactam 5/29>>5/30 Avelox 5/29>>  5/29 BCx2 - ngtd 5/29 UCx - 100K Enterococcus  Goal of Therapy:  Vancomycin trough level 10-15 mcg/ml  Plan:  -Vancomycin 1250 mg IV q24h -F/U culture data and clinical progress -Monitor renal function  [facility], [address].D., BCPS Clinical Pharmacist Pager: [phone] 06/19/2012 3:37 PM

## 2012-06-19 NOTE — Clinical Social Work Note (Signed)
Clinical Social Work Department BRIEF PSYCHOSOCIAL ASSESSMENT 06/19/2012  Patient:  Shelia Webb, Shelia Webb     Account Number:  [id number]     Admit date:  06/17/2012  Clinical Social Worker:  Verl Blalock  Date/Time:  06/19/2012 12:30 PM  Referred by:  Care Management  Date Referred:  06/19/2012 Referred for  SNF Placement   Other Referral:   Interview type:  Patient Other interview type:   Patient son over the phone    PSYCHOSOCIAL DATA Living Status:  FACILITY Admitted from facility:  [facility], [place] Level of care:  Skilled Nursing Facility Primary support name:  Shelia Webb, Shelia Webb  [phone] (c)  [phone] (h) Primary support relationship to patient:  CHILD, ADULT Degree of support available:   Strong    CURRENT CONCERNS Current Concerns  Post-Acute Placement   Other Concerns:    SOCIAL WORK ASSESSMENT / PLAN Clinical Social Worker met with patient at bedside and spoke to patient son over the phone to offer support and discuss patient plans at discharge.  Patient states that she lives at [facility] and plans to return, however requested that CSW confirm plans with her son.  CSW spoke with patient son over the phone who states that patient is a long term resident there and he would be hopeful for her return at discharge as well.  CSW contacted facility who is agreeable to patient return once medically ready.  CSW remains available for support and to facilitate patient discharge needs once medically stable.   Assessment/plan status:  Psychosocial Support/Ongoing Assessment of Needs Other assessment/ plan:   Information/referral to community resources:   Patient son lives in [place] and states that patient needs are met at the facility and he is pleased thus far with the care provided.  No resources provided at this time.  Will arrange ambulance transport at discharge.    PATIENT'S/FAMILY'S RESPONSE TO PLAN OF CARE: Patient alert and oriented to  person and place and was eating lunch at the time of assessment.  Patient seemed to hold her attention well and engage in conversation with appropriate responses.  Patient son appreciative of phone call and states that he is HCPOA and would just like to be updated on patient status and plans for discharge.    Shelia Webb, [place] [phone]

## 2012-06-19 NOTE — Progress Notes (Signed)
Chart reviewed.  Discussed with RN.  [facility] PROGRESS NOTE  Shelia Webb ZOX:[id number] DOB: [birth year] DOA: 06/17/2012 PCP: Terald Sleeper, MD  Assessment/Plan: Active Problems:   Aspiration pneumonia   DM   CAD   GERD   CVA (cerebral infarction)   Dysphagia   UTI (urinary tract infection)   Dehydration   Polypharmacy   patient is tolerating a diet today. Much more responsive. Urine culture growing out enterococcus species. Sensitivities pending. Will continue IV fluid, adjust antibiotics, now that patient is eating, to cover anaerobic/aspiration pneumonia and enterococcus in her urine. PT eval.  Code Status: DNR Family Communication: son, Shelia Webb via phone Disposition Plan: SNF  Consultants:    Procedures:    Antibiotics:  Aztreonam 5/28  Vancomycin 5/28 -> 5/29  avelox 5/28  HPI/Subjective: Feels better today. Some suprapubic pain. Tolerating pured diet with honey thickened liquids. No nausea or vomiting.   Objective: Filed Vitals:   06/18/12 1953 06/19/12 0529 06/19/12 0915 06/19/12 1434  BP: 123/73 142/86  152/85  Pulse: 84 87  102  Temp: 97.8 F (36.6 C) 97.7 F (36.5 C)  97.4 F (36.3 C)  TempSrc: Oral Oral  Oral  Resp: 16 18  22   Height:      Weight:      SpO2: 97% 97% 97% 95%    Intake/Output Summary (Last 24 hours) at 06/19/12 1506 Last data filed at 06/19/12 1319  Gross per 24 hour  Intake 2978.75 ml  Output      0 ml  Net 2978.75 ml   Filed Weights   06/18/12 0200 06/18/12 0353  Weight: 67.1 kg (147 lb 14.9 oz) 67.4 kg (148 lb 9.4 oz)    Exam:   General:  more alert and responsive and appropriate today.   HEENT:  Edentulous. mucous membranes slightly less   Cardiovascular: RRR without MGR  Respiratory: CTA without WRR  Abdomen: Bowel sounds present. Colostomy bag in place. Multiple surgical scars. Soft. Nontender  Extremities: No clubbing cyanosis. Muscle wasting present with mild contractures in the  lower extremities  Data Reviewed: Basic Metabolic Panel:  Recent Labs Lab 06/17/12 2221 06/18/12 0217 06/18/12 0735  NA 143  --  138  K 4.1  --  3.7  CL 100  --  102  CO2 31  --  22  GLUCOSE 118*  --  155*  BUN 14  --  15  CREATININE 0.87 0.66 0.71  CALCIUM 10.2  --  8.7   Liver Function Tests:  Recent Labs Lab 06/17/12 2221 06/18/12 0735  AST 21 15  ALT 17 13  ALKPHOS 106 85  BILITOT 0.4 0.5  PROT 8.4* 6.7  ALBUMIN 4.0 3.1*    Recent Labs Lab 06/17/12 2221  LIPASE 10*   No results found for this basename: AMMONIA,  in the last 168 hours CBC:  Recent Labs Lab 06/17/12 2221 06/18/12 0217 06/18/12 0745  WBC 16.6* 11.9* 10.9*  NEUTROABS 13.6*  --  7.8*  HGB 18.0* 16.5* 15.5*  HCT 52.0* 48.0* 45.9  MCV 88.3 88.6 89.5  PLT 182 160 166   Cardiac Enzymes: No results found for this basename: CKTOTAL, CKMB, CKMBINDEX, TROPONINI,  in the last 168 hours BNP (last 3 results) No results found for this basename: PROBNP,  in the last 8760 hours CBG:  Recent Labs Lab 06/18/12 1200 06/18/12 1622 06/18/12 2124 06/19/12 0609 06/19/12 1123  GLUCAP 141* 138* 83 111* 134*    Recent Results (from the past 240  hour(s))  URINE CULTURE     Status: None   Collection Time    06/18/12 12:47 AM      Result Value Range Status   Specimen Description URINE, CATHETERIZED   Final   Special Requests NONE   Final   Culture  Setup Time 06/18/2012 10:48   Final   Colony Count >=100,000 COLONIES/ML   Final   Culture ENTEROCOCCUS SPECIES   Final   Report Status PENDING   Incomplete  CULTURE, BLOOD (ROUTINE X 2)     Status: None   Collection Time    06/18/12  1:30 AM      Result Value Range Status   Specimen Description BLOOD RIGHT ARM   Final   Special Requests BOTTLES DRAWN AEROBIC AND ANAEROBIC 10CC   Final   Culture  Setup Time 06/18/2012 10:19   Final   Culture     Final   Value:        BLOOD CULTURE RECEIVED NO GROWTH TO DATE CULTURE WILL BE HELD FOR 5 DAYS BEFORE  ISSUING A FINAL NEGATIVE REPORT   Report Status PENDING   Incomplete  CULTURE, BLOOD (ROUTINE X 2)     Status: None   Collection Time    06/18/12  1:40 AM      Result Value Range Status   Specimen Description BLOOD RIGHT HAND   Final   Special Requests BOTTLES DRAWN AEROBIC ONLY 4CC   Final   Culture  Setup Time 06/18/2012 10:17   Final   Culture     Final   Value:        BLOOD CULTURE RECEIVED NO GROWTH TO DATE CULTURE WILL BE HELD FOR 5 DAYS BEFORE ISSUING A FINAL NEGATIVE REPORT   Report Status PENDING   Incomplete  MRSA PCR SCREENING     Status: None   Collection Time    06/18/12  4:02 AM      Result Value Range Status   MRSA by PCR NEGATIVE  NEGATIVE Final   Comment:            The GeneXpert MRSA Assay (FDA     approved for NASAL specimens     only), is one component of a     comprehensive MRSA colonization     surveillance program. It is not     intended to diagnose MRSA     infection nor to guide or     monitor treatment for     MRSA infections.     Studies: Ct Abdomen Pelvis Wo Contrast  06/18/2012   *RADIOLOGY REPORT*  Clinical Data: Feculent emesis; history of multiple abdominal surgeries.  CT ABDOMEN AND PELVIS WITHOUT CONTRAST  Technique:  Multidetector CT imaging of the abdomen and pelvis was performed following the standard protocol without intravenous contrast.  Comparison: CT of the abdomen and pelvis from 01/20/2010, and abdominal radiographs performed earlier today at 10:49 p.m.  Findings: Right basilar airspace opacity raises concern for pneumonia; this was not well characterized on the recent chest radiograph.  Diffuse coronary artery calcifications are seen.  There is mild distension of the stomach with fluid and air.  The duodenum is unremarkable in appearance, containing a small amount of fluid.  Scattered air and fluid are seen filling the jejunum and ileum, without evidence of bowel dilatation to suggest obstruction. Visualized small bowel loops remain normal  in caliber.  As on the prior study, there are three anterior abdominal wall hernias.  The largest reflects the patient's  left mid abdominal colostomy; an associated long segment of herniated small bowel is seen.  There is also a long segment of small bowel within a broad- based right lower quadrant anterior abdominal wall hernia.  In addition, a small hernia is seen to the left of this hernia, slightly increased in size from the prior study and again containing a relatively short segment of ileum.  There is no evidence of strangulation at this time.  No significant soft tissue inflammation is seen with respect to any of these hernias.  A broader bulge is noted to the right of the colostomy hernia, also grossly unchanged from the prior study.  The remaining colon is grossly unremarkable, containing a small amount of fluid.  Residual stool is noted within the patient's Hartmann's pouch.  The liver and spleen are unremarkable in appearance.  The gallbladder is within normal limits.  There is diffuse atrophy of the pancreas.  The adrenal glands are grossly unremarkable in appearance.  A 1.8 cm stone is noted at the interpole region of the left kidney; there is a 0.8 cm stone at the interpole region of the right kidney.  Large bilateral renal cysts are seen, measuring up to 8.3 cm in size.  Mild nonspecific perinephric stranding is noted bilaterally.  There is no evidence of hydronephrosis.  No obstructing ureteral stones are seen.  No significant free fluid is seen within the abdomen or pelvis.  No acute vascular abnormalities are seen.  Scattered calcification is noted along the abdominal aorta and its branches.  The bladder is mildly distended and grossly unremarkable in appearance.  The patient is status post hysterectomy.  The ovaries are grossly symmetric; no suspicious adnexal masses are seen.  No inguinal lymphadenopathy is seen.  No acute osseous abnormalities are identified.  Multilevel vacuum phenomenon is  noted along the lumbar spine.  IMPRESSION:  1.  Right basilar airspace opacity, concerning for pneumonia. 2.  Mild distension of the stomach with fluid and air, without evidence of distal obstruction.  The duodenum is largely decompressed and grossly unremarkable in appearance.  No evidence of more distal small bowel obstruction; visualized small bowel loops are partially filled with fluid and appear grossly unremarkable. 3.  As on the prior study, there are three anterior abdominal wall hernias containing loops of small bowel, the largest of which involves the patient's left-sided colostomy.  No evidence of strangulation; no associated soft tissue inflammation seen. 4.  Bilateral renal stones again seen, measuring up to 1.8 cm on the left side.  Bilateral renal cysts noted, measuring up to 8.3 cm in size. 5.  Scattered calcification along the abdominal aorta and its branches. 6.  Diffuse coronary artery calcifications seen.   Original Report Authenticated By: Tonia Ghent, M.D.   Dg Chest Portable 1 View  06/18/2012   *RADIOLOGY REPORT*  Clinical Data: Vomiting; shortness of breath.  PORTABLE CHEST - 1 VIEW  Comparison: Chest radiograph performed earlier today at 10:49 p.m.  Findings: The lungs are mildly hypoexpanded.  Mild chronic peribronchial thickening is again seen.  No focal consolidation, pleural effusion or pneumothorax is identified.  The cardiomediastinal silhouette is borderline enlarged.  No acute osseous abnormalities are seen.  IMPRESSION: Lungs mildly hypoexpanded; no acute cardiopulmonary process seen. Mild chronic peribronchial thickening noted; borderline cardiomegaly.   Original Report Authenticated By: Tonia Ghent, M.D.   Dg Abd Acute W/chest  06/17/2012   *RADIOLOGY REPORT*  Clinical Data: Diffuse abdominal pain.  ACUTE ABDOMEN SERIES (ABDOMEN 2 VIEW & CHEST  1 VIEW)  Comparison: Chest radiograph performed 03/13/2012, and CT of the abdomen and pelvis performed 01/20/2010  Findings: The  lungs are well-aerated.  Mild chronic peribronchial thickening is seen.  There is no evidence of focal opacification, pleural effusion or pneumothorax.  The cardiomediastinal silhouette is borderline normal in size.  The visualized bowel gas pattern is unremarkable.  Scattered fluid and air are seen within the colon; there is no evidence of small bowel dilatation to suggest obstruction. Scattered small air-fluid levels on the decubitus view likely remain within normal limits, without evidence of obstruction.  No free intra-abdominal air is identified on the provided decubitus view.  The stomach is mildly distended with air and fluid.  No acute osseous abnormalities are seen; the sacroiliac joints are unremarkable in appearance.  IMPRESSION:  1.  Unremarkable bowel gas pattern; no free intra-abdominal air seen.  The stomach is mildly distended with fluid and air. 2.  Mild chronic peribronchial thickening seen; lungs otherwise clear.   Original Report Authenticated By: Tonia Ghent, M.D.    Scheduled Meds: . aspirin EC  81 mg Oral Daily  . aztreonam  1 g Intravenous Q8H  . cycloSPORINE  1 drop Both Eyes BID  . DULoxetine  60 mg Oral Daily  . heparin  5,000 Units Subcutaneous Q8H  . insulin aspart  0-15 Units Subcutaneous TID WC  . insulin detemir  15 Units Subcutaneous BID  . metoprolol  2.5 mg Intravenous Q6H  . mometasone-formoterol  2 puff Inhalation BID  . moxifloxacin  400 mg Intravenous Q24H  . oxyCODONE      . pantoprazole (PROTONIX) IV  40 mg Intravenous Q12H   Continuous Infusions: . sodium chloride 125 mL/hr (06/18/12 1413)   Time spent: 40 min  Xayne Brumbaugh L  [facility] Pager [phone]. If 7PM-7AM, please contact night-coverage at [email or web address], password [facility] 06/19/2012, 3:06 PM  LOS: 2 days

## 2012-06-20 DIAGNOSIS — R404 Transient alteration of awareness: Secondary | ICD-10-CM

## 2012-06-20 DIAGNOSIS — R410 Disorientation, unspecified: Secondary | ICD-10-CM | POA: Clinically undetermined

## 2012-06-20 LAB — GLUCOSE, CAPILLARY
Glucose-Capillary: 99 mg/dL (ref 70–99)
Glucose-Capillary: 121 mg/dL — ABNORMAL HIGH (ref 70–99)
Glucose-Capillary: 108 mg/dL — ABNORMAL HIGH (ref 70–99)

## 2012-06-20 LAB — URINE CULTURE: Colony Count: >=100000

## 2012-06-20 LAB — CBC
HCT: 44.6 % (ref 36.0–46.0)
Hemoglobin: 15.4 g/dL — ABNORMAL HIGH (ref 12.0–15.0)
MCH: 30.2 pg (ref 26.0–34.0)
MCHC: 34.5 g/dL (ref 30.0–36.0)

## 2012-06-20 LAB — BASIC METABOLIC PANEL
BUN: 12 mg/dL (ref 6–23)
Chloride: 110 mEq/L (ref 96–112)
Glucose, Bld: 108 mg/dL — ABNORMAL HIGH (ref 70–99)
Potassium: 3.7 mEq/L (ref 3.5–5.1)

## 2012-06-20 MED ORDER — SODIUM CHLORIDE 0.45 % IV SOLN
INTRAVENOUS | Status: DC
  Administered 2012-06-20 – 2012-06-21 (×2): via INTRAVENOUS

## 2012-06-20 NOTE — Progress Notes (Signed)
Physical Therapy Discharge Patient Details Name: Shelia Webb MRN: [id number] DOB: 1922/01/19 Today's Date: 06/20/2012 Time: 9147-8295 PT Time Calculation (min): 20 min  Patient discharged from PT services secondary to pt currently at baseline and plans to return as a resident at [facility] .  Please see latest therapy progress note for current level of functioning and progress toward goals.    Progress and discharge plan discussed with patient and/or caregiver:   GP     [name] 06/20/2012, 2:51 PM  Jake Shark, PT DPT [phone]

## 2012-06-20 NOTE — Progress Notes (Signed)
Chart reviewed.  Discussed with RN.  [facility] PROGRESS NOTE  Shelia Webb ZOX:[id number] DOB: [birth year] DOA: 06/17/2012 PCP: Terald Sleeper, MD  Assessment/Plan: Active Problems:   Aspiration pneumonia   DM   CAD   GERD   CVA (cerebral infarction)   Dysphagia   UTI (urinary tract infection)   Dehydration   Polypharmacy ACUTE DELIRIUM: [place]DOWN[place]  Urine culture still pending. Cont current  Code Status: DNR Family Communication: none today Disposition Plan: SNF  Consultants:    Procedures:    Antibiotics:  Aztreonam 5/28  Vancomycin 5/28 -> 5/29  avelox 5/28  HPI/Subjective: Unable. Patient is confused.  Objective: Filed Vitals:   06/19/12 1434 06/19/12 2018 06/20/12 0427 06/20/12 0858  BP: 152/85  169/99   Pulse: 102 96 107   Temp: 97.4 F (36.3 C) 97.5 F (36.4 C) 97.9 F (36.6 C)   TempSrc: Oral Axillary Oral   Resp: 22  20   Height:      Weight:      SpO2: 95% 100% 97% 98%    Intake/Output Summary (Last 24 hours) at 06/20/12 0917 Last data filed at 06/19/12 1319  Gross per 24 hour  Intake    120 ml  Output      0 ml  Net    120 ml   Filed Weights   06/18/12 0200 06/18/12 0353  Weight: 67.1 kg (147 lb 14.9 oz) 67.4 kg (148 lb 9.4 oz)    Exam:   General:  Confused. Dictating with sheets.  Marland Kitchen  HEENT:  Edentulous. Moist mucous membranes.  Cardiovascular: RRR without MGR  Respiratory: CTA without WRR  Abdomen: Bowel sounds present. Colostomy bag in place. Multiple surgical scars. Soft. Nontender  Extremities: No clubbing cyanosis. Muscle wasting present with mild contractures in the lower extremities  Data Reviewed: Basic Metabolic Panel:  Recent Labs Lab 06/17/12 2221 06/18/12 0217 06/18/12 0735 06/20/12 0410  NA 143  --  138 147*  K 4.1  --  3.7 3.7  CL 100  --  102 110  CO2 31  --  22 25  GLUCOSE 118*  --  155* 108*  BUN 14  --  15 12  CREATININE 0.87 0.66 0.71 0.62  CALCIUM 10.2  --  8.7 9.1    Liver Function Tests:  Recent Labs Lab 06/17/12 2221 06/18/12 0735  AST 21 15  ALT 17 13  ALKPHOS 106 85  BILITOT 0.4 0.5  PROT 8.4* 6.7  ALBUMIN 4.0 3.1*    Recent Labs Lab 06/17/12 2221  LIPASE 10*   No results found for this basename: AMMONIA,  in the last 168 hours CBC:  Recent Labs Lab 06/17/12 2221 06/18/12 0217 06/18/12 0745 06/20/12 0410  WBC 16.6* 11.9* 10.9* 9.2  NEUTROABS 13.6*  --  7.8*  --   HGB 18.0* 16.5* 15.5* 15.4*  HCT 52.0* 48.0* 45.9 44.6  MCV 88.3 88.6 89.5 87.5  PLT 182 160 166 185   Cardiac Enzymes: No results found for this basename: CKTOTAL, CKMB, CKMBINDEX, TROPONINI,  in the last 168 hours BNP (last 3 results) No results found for this basename: PROBNP,  in the last 8760 hours CBG:  Recent Labs Lab 06/19/12 0609 06/19/12 1123 06/19/12 1624 06/19/12 2108 06/20/12 0544  GLUCAP 111* 134* 113* 154* 99    Recent Results (from the past 240 hour(s))  URINE CULTURE     Status: None   Collection Time    06/18/12 12:47 AM      Result  Value Range Status   Specimen Description URINE, CATHETERIZED   Final   Special Requests NONE   Final   Culture  Setup Time 06/18/2012 10:48   Final   Colony Count >=100,000 COLONIES/ML   Final   Culture ENTEROCOCCUS SPECIES   Final   Report Status PENDING   Incomplete  CULTURE, BLOOD (ROUTINE X 2)     Status: None   Collection Time    06/18/12  1:30 AM      Result Value Range Status   Specimen Description BLOOD RIGHT ARM   Final   Special Requests BOTTLES DRAWN AEROBIC AND ANAEROBIC 10CC   Final   Culture  Setup Time 06/18/2012 10:19   Final   Culture     Final   Value:        BLOOD CULTURE RECEIVED NO GROWTH TO DATE CULTURE WILL BE HELD FOR 5 DAYS BEFORE ISSUING A FINAL NEGATIVE REPORT   Report Status PENDING   Incomplete  CULTURE, BLOOD (ROUTINE X 2)     Status: None   Collection Time    06/18/12  1:40 AM      Result Value Range Status   Specimen Description BLOOD RIGHT HAND   Final    Special Requests BOTTLES DRAWN AEROBIC ONLY 4CC   Final   Culture  Setup Time 06/18/2012 10:17   Final   Culture     Final   Value:        BLOOD CULTURE RECEIVED NO GROWTH TO DATE CULTURE WILL BE HELD FOR 5 DAYS BEFORE ISSUING A FINAL NEGATIVE REPORT   Report Status PENDING   Incomplete  MRSA PCR SCREENING     Status: None   Collection Time    06/18/12  4:02 AM      Result Value Range Status   MRSA by PCR NEGATIVE  NEGATIVE Final   Comment:            The GeneXpert MRSA Assay (FDA     approved for NASAL specimens     only), is one component of a     comprehensive MRSA colonization     surveillance program. It is not     intended to diagnose MRSA     infection nor to guide or     monitor treatment for     MRSA infections.     Studies: No results found.  Scheduled Meds: . aspirin EC  81 mg Oral Daily  . cycloSPORINE  1 drop Both Eyes BID  . DULoxetine  60 mg Oral Daily  . heparin  5,000 Units Subcutaneous Q8H  . insulin aspart  0-15 Units Subcutaneous TID WC  . insulin detemir  15 Units Subcutaneous BID  . metoprolol succinate  25 mg Oral Daily  . mometasone-formoterol  2 puff Inhalation BID  . moxifloxacin  400 mg Oral Q24H  . pantoprazole (PROTONIX) IV  40 mg Intravenous Q12H  . vancomycin  1,250 mg Intravenous Q24H   Continuous Infusions: . sodium chloride 125 mL/hr at 06/20/12 0615   Time spent: 40 min  Shelia Webb  [facility] Pager [phone]. If 7PM-7AM, please contact night-coverage at [email or web address], password [facility] 06/20/2012, 9:17 AM  LOS: 3 days

## 2012-06-20 NOTE — Evaluation (Signed)
Physical Therapy Evaluation Patient Details Name: SYBIL SHRADER MRN: [id number] DOB: 1921-02-12 Today's Date: 06/20/2012 Time: 1355-1415 PT Time Calculation (min): 20 min  PT Assessment / Plan / Recommendation Clinical Impression  Pt is 79 y/o female is a resident at [facility] for 3 years and admitted for UTI.  Pt completely dependent prior to admission and RN staff used lift to get pt OOB and rolled to change depends.  Pt currently at baseline and has no further PT needs.  PT will sign off.    PT Assessment  Patent does not need any further PT services    Follow Up Recommendations   (Return to [facility])    Equipment Recommendations  None recommended by PT    Precautions / Restrictions Precautions Precautions: Fall   Pertinent Vitals/Pain C/o low back pain but unable to rate      Mobility  Bed Mobility Bed Mobility: Rolling Right;Rolling Left Rolling Right: 2: Max assist Rolling Left: 1: +1 Total assist Details for Bed Mobility Assistance: Pt dependent with rolling especially to left side and needs (A) with use of pad to roll.  Pt reports this is close to baseline Transfers Transfers: Not assessed Ambulation/Gait Ambulation/Gait Assistance: Not tested (comment) Wheelchair Mobility Wheelchair Mobility: No    Exercises     PT Diagnosis:    PT Problem List:   PT Treatment Interventions:     PT Goals    Visit Information  Last PT Received On: 06/20/12 Assistance Needed: +2    Subjective Data  Subjective: "I can't move much."  Patient Stated Goal: Did not set   Prior Functioning  Home Living Available Help at Discharge: Skilled Nursing Facility Additional Comments: Pt long term resident at [facility] for three years Prior Function Level of Independence: Needs assistance Needs Assistance: Bathing;Dressing;Feeding;Toileting;Transfers Bath: Total Dressing: Total Feeding: Minimal Toileting: Total (pt has colostomy bag and uses depends) Transfer  Assistance: uses lift Able to Take Stairs?: No Driving: No 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 Left Lower Extremity Assessment LLE ROM/Strength/Tone: Deficits LLE ROM/Strength/Tone Deficits: Pt with bilateral LE contractures in hip adductures and knee extension.   Balance Balance Balance Assessed: No  End of Session PT - End of Session Activity Tolerance: Other (comment) (Pt with limited overall mobilty and dependent) Patient left: in bed;with call bell/phone within reach Nurse Communication: Mobility status;Need for lift equipment  GP     Kevron Patella 06/20/2012, 2:50 PM [place], [place] DPT [phone]

## 2012-06-21 LAB — BASIC METABOLIC PANEL
CO2: 24 mEq/L (ref 19–32)
GFR calc non Af Amer: 81 mL/min — ABNORMAL LOW (ref >90)
Glucose, Bld: 89 mg/dL (ref 70–99)
Potassium: 3.1 mEq/L — ABNORMAL LOW (ref 3.5–5.1)
Sodium: 143 mEq/L (ref 135–145)

## 2012-06-21 LAB — GLUCOSE, CAPILLARY: Glucose-Capillary: 83 mg/dL (ref 70–99)

## 2012-06-21 MED ORDER — ROPINIROLE HCL 0.25 MG PO TABS
0.2500 mg | ORAL_TABLET | Freq: Two times a day (BID) | ORAL | Status: DC
  Administered 2012-06-21 – 2012-06-22 (×2): 0.25 mg via ORAL
  Filled 2012-06-21 (×3): qty 1

## 2012-06-21 MED ORDER — GABAPENTIN 300 MG PO CAPS
300.0000 mg | ORAL_CAPSULE | Freq: Two times a day (BID) | ORAL | Status: DC
  Administered 2012-06-21 – 2012-06-22 (×2): 300 mg via ORAL
  Filled 2012-06-21 (×3): qty 1

## 2012-06-21 MED ORDER — DOXYCYCLINE HYCLATE 100 MG PO TABS
100.0000 mg | ORAL_TABLET | Freq: Two times a day (BID) | ORAL | Status: DC
  Administered 2012-06-21 – 2012-06-22 (×2): 100 mg via ORAL
  Filled 2012-06-21 (×3): qty 1

## 2012-06-21 MED ORDER — BACLOFEN 5 MG HALF TABLET
5.0000 mg | ORAL_TABLET | Freq: Three times a day (TID) | ORAL | Status: DC | PRN
  Filled 2012-06-21: qty 1

## 2012-06-21 MED ORDER — DICLOFENAC SODIUM 1 % TD GEL
1.0000 "application " | Freq: Four times a day (QID) | TRANSDERMAL | Status: DC | PRN
  Filled 2012-06-21: qty 100

## 2012-06-21 MED ORDER — CEPHALEXIN 500 MG PO CAPS
500.0000 mg | ORAL_CAPSULE | Freq: Two times a day (BID) | ORAL | Status: DC
  Filled 2012-06-21: qty 1

## 2012-06-21 MED ORDER — POTASSIUM CHLORIDE 10 MEQ/100ML IV SOLN
10.0000 meq | INTRAVENOUS | Status: AC
  Administered 2012-06-21 (×4): 10 meq via INTRAVENOUS
  Filled 2012-06-21 (×4): qty 100

## 2012-06-21 MED ORDER — OXYCODONE-ACETAMINOPHEN 5-325 MG PO TABS: 1.0000 | ORAL_TABLET | Freq: Four times a day (QID) | ORAL | Status: DC | PRN

## 2012-06-21 NOTE — Progress Notes (Addendum)
Chart reviewed.  Discussed with RN.  [facility] PROGRESS NOTE  Shelia Webb BJY:[id number] DOB: 28-Mar-1921 DOA: 06/17/2012 PCP: Terald Sleeper, MD  Assessment/Plan: Active Problems:   Aspiration pneumonia   DM   CAD   GERD   CVA (cerebral infarction)   Dysphagia   UTI (urinary tract infection)   Dehydration   Polypharmacy   Acute delirium ACUTE DELIRIUM: SUNDOWNERS  hypokalemia  urine culture shows enterococcus sensitive to everything except fluoroquinolones. Patient is penicillin allergic. Will give doxycycline and stop vancomycin. Continue Avelox by mouth. Back to skilled nursing facility tomorrow. Seems less confused today. replete potassium. will resume gabapentin, baclofen as needed, Percocet as needed, Requip twice a day.  Code Status: DNR Family Communication: none today Disposition Plan: SNF  Consultants:    Procedures:    Antibiotics:  Aztreonam 5/28  Vancomycin 5/28 -> 6/1  avelox 5/28  Cephalexin 6/1  HPI/Subjective: Complains of some leg pain. Nursing staff reports patient is eating well and otherwise stable.  Objective: Filed Vitals:   06/20/12 1401 06/20/12 2000 06/21/12 0452 06/21/12 1454  BP: 175/79 122/79 154/85 153/87  Pulse: 91 86 79 97  Temp:  97.2 F (36.2 C) 97.5 F (36.4 C) 98 F (36.7 C)  TempSrc: Oral Oral Oral Oral  Resp: 19 18 18 18   Height:      Weight:      SpO2: 99% 97% 98% 98%    Intake/Output Summary (Last 24 hours) at 06/21/12 1548 Last data filed at 06/21/12 1408  Gross per 24 hour  Intake 1504.17 ml  Output      0 ml  Net 1504.17 ml   Filed Weights   06/18/12 0200 06/18/12 0353  Weight: 67.1 kg (147 lb 14.9 oz) 67.4 kg (148 lb 9.4 oz)    Exam:   General:  Alert. Voice quiet. Calmer and less confused today.  Marland Kitchen  HEENT:  Edentulous. Moist mucous membranes.  Cardiovascular: RRR without MGR  Respiratory: CTA without WRR  Abdomen: Bowel sounds present. Colostomy bag in place. Multiple  surgical scars. Soft. Nontender  Extremities: No clubbing cyanosis. Muscle wasting present with mild contractures in the lower extremities  Data Reviewed: Basic Metabolic Panel:  Recent Labs Lab 06/17/12 2221 06/18/12 0217 06/18/12 0735 06/20/12 0410 06/21/12 0400  NA 143  --  138 147* 143  K 4.1  --  3.7 3.7 3.1*  CL 100  --  102 110 107  CO2 31  --  22 25 24   GLUCOSE 118*  --  155* 108* 89  BUN 14  --  15 12 11   CREATININE 0.87 0.66 0.71 0.62 0.56  CALCIUM 10.2  --  8.7 9.1 8.9   Liver Function Tests:  Recent Labs Lab 06/17/12 2221 06/18/12 0735  AST 21 15  ALT 17 13  ALKPHOS 106 85  BILITOT 0.4 0.5  PROT 8.4* 6.7  ALBUMIN 4.0 3.1*    Recent Labs Lab 06/17/12 2221  LIPASE 10*   No results found for this basename: AMMONIA,  in the last 168 hours CBC:  Recent Labs Lab 06/17/12 2221 06/18/12 0217 06/18/12 0745 06/20/12 0410  WBC 16.6* 11.9* 10.9* 9.2  NEUTROABS 13.6*  --  7.8*  --   HGB 18.0* 16.5* 15.5* 15.4*  HCT 52.0* 48.0* 45.9 44.6  MCV 88.3 88.6 89.5 87.5  PLT 182 160 166 185   Cardiac Enzymes: No results found for this basename: CKTOTAL, CKMB, CKMBINDEX, TROPONINI,  in the last 168 hours BNP (last 3 results)  No results found for this basename: PROBNP,  in the last 8760 hours CBG:  Recent Labs Lab 06/20/12 1130 06/20/12 1617 06/20/12 2113 06/21/12 0605 06/21/12 1123  GLUCAP 131* 121* 108* 83 98    Recent Results (from the past 240 hour(s))  URINE CULTURE     Status: None   Collection Time    06/18/12 12:47 AM      Result Value Range Status   Specimen Description URINE, CATHETERIZED   Final   Special Requests NONE   Final   Culture  Setup Time 06/18/2012 10:48   Final   Colony Count >=100,000 COLONIES/ML   Final   Culture ENTEROCOCCUS SPECIES   Final   Report Status 06/20/2012 FINAL   Final   Organism ID, Bacteria ENTEROCOCCUS SPECIES   Final  CULTURE, BLOOD (ROUTINE X 2)     Status: None   Collection Time    06/18/12  1:30  AM      Result Value Range Status   Specimen Description BLOOD RIGHT ARM   Final   Special Requests BOTTLES DRAWN AEROBIC AND ANAEROBIC 10CC   Final   Culture  Setup Time 06/18/2012 10:19   Final   Culture     Final   Value:        BLOOD CULTURE RECEIVED NO GROWTH TO DATE CULTURE WILL BE HELD FOR 5 DAYS BEFORE ISSUING A FINAL NEGATIVE REPORT   Report Status PENDING   Incomplete  CULTURE, BLOOD (ROUTINE X 2)     Status: None   Collection Time    06/18/12  1:40 AM      Result Value Range Status   Specimen Description BLOOD RIGHT HAND   Final   Special Requests BOTTLES DRAWN AEROBIC ONLY 4CC   Final   Culture  Setup Time 06/18/2012 10:17   Final   Culture     Final   Value:        BLOOD CULTURE RECEIVED NO GROWTH TO DATE CULTURE WILL BE HELD FOR 5 DAYS BEFORE ISSUING A FINAL NEGATIVE REPORT   Report Status PENDING   Incomplete  MRSA PCR SCREENING     Status: None   Collection Time    06/18/12  4:02 AM      Result Value Range Status   MRSA by PCR NEGATIVE  NEGATIVE Final   Comment:            The GeneXpert MRSA Assay (FDA     approved for NASAL specimens     only), is one component of a     comprehensive MRSA colonization     surveillance program. It is not     intended to diagnose MRSA     infection nor to guide or     monitor treatment for     MRSA infections.     Studies: No results found.  Scheduled Meds: . aspirin EC  81 mg Oral Daily  . cycloSPORINE  1 drop Both Eyes BID  . DULoxetine  60 mg Oral Daily  . heparin  5,000 Units Subcutaneous Q8H  . insulin aspart  0-15 Units Subcutaneous TID WC  . insulin detemir  15 Units Subcutaneous BID  . metoprolol succinate  25 mg Oral Daily  . mometasone-formoterol  2 puff Inhalation BID  . moxifloxacin  400 mg Oral Q24H  . pantoprazole (PROTONIX) IV  40 mg Intravenous Q12H  . vancomycin  1,250 mg Intravenous Q24H   Continuous Infusions: . sodium chloride 10 mL/hr at 06/21/12  1610   Time spent: 40 min  Olivene Cookston  L  [facility] Pager [phone]. If 7PM-7AM, please contact night-coverage at [email or web address], password [facility] 06/21/2012, 3:48 PM  LOS: 4 days

## 2012-06-22 DIAGNOSIS — E119 Type 2 diabetes mellitus without complications: Secondary | ICD-10-CM

## 2012-06-22 LAB — GLUCOSE, CAPILLARY

## 2012-06-22 LAB — BASIC METABOLIC PANEL
BUN: 13 mg/dL (ref 6–23)
Calcium: 8.7 mg/dL (ref 8.4–10.5)
Chloride: 106 mEq/L (ref 96–112)
Creatinine, Ser: 0.54 mg/dL (ref 0.50–1.10)
GFR calc Af Amer: >90 mL/min (ref >90)

## 2012-06-22 MED ORDER — MOXIFLOXACIN HCL 400 MG PO TABS: 400.0000 mg | ORAL_TABLET | ORAL | Status: DC

## 2012-06-22 MED ORDER — DOXYCYCLINE HYCLATE 100 MG PO TABS: 100.0000 mg | ORAL_TABLET | Freq: Two times a day (BID) | ORAL | Status: DC

## 2012-06-22 MED ORDER — TRAZODONE HCL 50 MG PO TABS: 50.0000 mg | ORAL_TABLET | Freq: Every evening | ORAL | Status: DC | PRN

## 2012-06-22 MED ORDER — PANTOPRAZOLE SODIUM 40 MG PO TBEC
40.0000 mg | DELAYED_RELEASE_TABLET | Freq: Two times a day (BID) | ORAL | Status: DC
  Administered 2012-06-22: 40 mg via ORAL

## 2012-06-22 NOTE — Progress Notes (Signed)
D/c instructions and d/c summary in packet of information for SNF, pt returning to [facility].  Packet given to [facility] transporters. Pt d/c'd with belongings via stretcher with [facility].  ( Unable to complete pathway/d/c instruction education due to pt cognition, info sent with pt to SNF.)

## 2012-06-22 NOTE — Clinical Social Work Note (Signed)
Clinical Social Worker facilitated patient discharge including contacting patient family and facility to confirm patient discharge plans.  Clinical information faxed to facility and family agreeable with plan.  CSW arranged ambulance transport via [facility] to [facility].  RN to call report prior to discharge.  Clinical Social Worker will sign off for now as social work intervention is no longer needed. Please consult us again if new need arises.  Jesse Francile Woolford, LCSW [phone] 

## 2012-06-22 NOTE — Discharge Summary (Signed)
Physician Discharge Summary  Shelia Webb BJY:[id number] DOB: 08/01/1921 DOA: 06/17/2012  PCP: Shelia Sleeper, MD  Admit date: 06/17/2012 Discharge date: 06/22/2012  Time spent: 40 minutes  Discharge Diagnoses:  Active Problems:   Aspiration pneumonia   DM   CAD   GERD   CVA (cerebral infarction)   Dysphagia   UTI (urinary tract infection)   Dehydration   Polypharmacy   Acute delirium  Discharge Condition: stable  Filed Weights   06/18/12 0200 06/18/12 0353  Weight: 67.1 kg (147 lb 14.9 oz) 67.4 kg (148 lb 9.4 oz)   Hospital Course:  79 year old white female from nursing home who was sent to the emergency room with vomiting. She is essentially bed and chair ridden and CODE STATUS is DO NOT RESUSCITATE. She has a colostomy. Workup in the emergency room was significant for a urinary tract infection and right-sided pneumonia seen on CT of the abdomen and pelvis. She had no obstruction. She was profoundly dehydrated. Patient was started on initially broad-spectrum antibiotics. It was felt her pneumonia was aspiration related and she was switched to Avelox for better anaerobic coverage. Urine culture grew out enterococcus and she has been started on doxycycline. Patient's diet was advanced to her usual pureed with honey thickened liquids. Her vomiting resolved. Her vital signs remained stable. She is now euvolemic. She is tolerating a diet and is close to her clinical baseline. Have discussed with patient's son. She will go back to skilled nursing facility.  Discharge Exam: Filed Vitals:   06/21/12 0452 06/21/12 1454 06/21/12 2104 06/22/12 0613  BP: 154/85 153/87 160/96 146/84  Pulse: 79 97 96 78  Temp: 97.5 F (36.4 C) 98 F (36.7 C) 97.4 F (36.3 C) 97.3 F (36.3 C)  TempSrc: Oral Oral Oral Oral  Resp: 18 18 20 20   Height:      Weight:      SpO2: 98% 98% 95% 99%    General: Alert. Voice is quiet. Comfortable. Cardiovascular: Regular rate rhythm without murmurs gallops  or  Respiratory: Clear to auscultation without wheeze rhonchi or rales Extremities no clubbing cyanosis or edema Abdomen obese soft nontender  Discharge Instructions  Discharge Orders   Future Orders Complete By Expires     Diet - low sodium heart healthy  As directed     Diet Carb Modified  As directed     Walk with assistance  As directed         Medication List    STOP taking these medications       methylcellulose 1 % ophthalmic solution  Commonly known as:  ARTIFICIAL TEARS      TAKE these medications       albuterol 108 (90 BASE) MCG/ACT inhaler  Commonly known as:  PROVENTIL HFA;VENTOLIN HFA  Inhale 2 puffs into the lungs every 6 (six) hours as needed for wheezing.     aspirin EC 81 MG tablet  Take 81 mg by mouth daily.     baclofen 5 mg Tabs  Commonly known as:  LIORESAL  Take 5 mg by mouth 3 (three) times daily as needed (neck spasms).     calcium-vitamin D 500-200 MG-UNIT per tablet  Commonly known as:  OSCAL WITH D  Take 1 tablet by mouth daily.     cycloSPORINE 0.05 % ophthalmic emulsion  Commonly known as:  RESTASIS  Place 1 drop into both eyes 2 (two) times daily.     diclofenac sodium 1 % Gel  Commonly known  as:  VOLTAREN  Apply 1 application topically 4 (four) times daily as needed (knee pain). For both knees     doxycycline 100 MG tablet  Commonly known as:  VIBRA-TABS  Take 1 tablet (100 mg total) by mouth every 12 (twelve) hours. For 3 more days     DULoxetine 60 MG capsule  Commonly known as:  CYMBALTA  Take 60 mg by mouth daily.     Fluticasone-Salmeterol 100-50 MCG/DOSE Aepb  Commonly known as:  ADVAIR  Inhale 1 puff into the lungs daily.     furosemide 20 MG tablet  Commonly known as:  LASIX  Take 20 mg by mouth daily.     gabapentin 300 MG capsule  Commonly known as:  NEURONTIN  Take 300 mg by mouth 2 (two) times daily.     insulin aspart 100 UNIT/ML injection  Commonly known as:  novoLOG  Inject 10 Units into the skin 3  (three) times daily before meals. Do Not give at bedtime     insulin detemir 100 UNIT/ML injection  Commonly known as:  LEVEMIR  Inject 15-20 Units into the skin 2 (two) times daily. 15units at bedtime  20 units daily     LACRI-LUBE OP  Place 1 drop into both eyes at bedtime.     metoprolol succinate 25 MG 24 hr tablet  Commonly known as:  TOPROL-XL  Take 25 mg by mouth daily.     moxifloxacin 400 MG tablet  Commonly known as:  AVELOX  Take 1 tablet (400 mg total) by mouth daily. For 3 more days     multivitamins ther. w/minerals Tabs  Take 1 tablet by mouth daily.     omeprazole 20 MG tablet  Commonly known as:  PRILOSEC OTC  Take 20 mg by mouth daily.     oxyCODONE-acetaminophen 5-325 MG per tablet  Commonly known as:  PERCOCET/ROXICET  Take 1 tablet by mouth every 6 (six) hours as needed. For pain     promethazine 25 MG suppository  Commonly known as:  PHENERGAN  Place 25 mg rectally every 6 (six) hours as needed for nausea.     rOPINIRole 0.25 MG tablet  Commonly known as:  REQUIP  Take 0.25 mg by mouth 2 (two) times daily.     senna 8.6 MG Tabs  Commonly known as:  SENOKOT  Take 1 tablet by mouth at bedtime.     traZODone 50 MG tablet  Commonly known as:  DESYREL  Take 1 tablet (50 mg total) by mouth at bedtime as needed for sleep. For insomnia     Vitamin D (Ergocalciferol) 50000 UNITS Caps  Commonly known as:  DRISDOL  Take 50,000 Units by mouth every 7 (seven) days. mondays       Allergies  Allergen Reactions  . Penicillins Other (See Comments)    Redness and skin irritation      The results of significant diagnostics from this hospitalization (including imaging, microbiology, ancillary and laboratory) are listed below for reference.    Significant Diagnostic Studies: Ct Abdomen Pelvis Wo Contrast  06/18/2012   *RADIOLOGY REPORT*  Clinical Data: Feculent emesis; history of multiple abdominal surgeries.  CT ABDOMEN AND PELVIS WITHOUT CONTRAST   Technique:  Multidetector CT imaging of the abdomen and pelvis was performed following the standard protocol without intravenous contrast.  Comparison: CT of the abdomen and pelvis from 01/20/2010, and abdominal radiographs performed earlier today at 10:49 p.m.  Findings: Right basilar airspace opacity raises concern for pneumonia;  this was not well characterized on the recent chest radiograph.  Diffuse coronary artery calcifications are seen.  There is mild distension of the stomach with fluid and air.  The duodenum is unremarkable in appearance, containing a small amount of fluid.  Scattered air and fluid are seen filling the jejunum and ileum, without evidence of bowel dilatation to suggest obstruction. Visualized small bowel loops remain normal in caliber.  As on the prior study, there are three anterior abdominal wall hernias.  The largest reflects the patient's left mid abdominal colostomy; an associated long segment of herniated small bowel is seen.  There is also a long segment of small bowel within a broad- based right lower quadrant anterior abdominal wall hernia.  In addition, a small hernia is seen to the left of this hernia, slightly increased in size from the prior study and again containing a relatively short segment of ileum.  There is no evidence of strangulation at this time.  No significant soft tissue inflammation is seen with respect to any of these hernias.  A broader bulge is noted to the right of the colostomy hernia, also grossly unchanged from the prior study.  The remaining colon is grossly unremarkable, containing a small amount of fluid.  Residual stool is noted within the patient's Hartmann's pouch.  The liver and spleen are unremarkable in appearance.  The gallbladder is within normal limits.  There is diffuse atrophy of the pancreas.  The adrenal glands are grossly unremarkable in appearance.  A 1.8 cm stone is noted at the interpole region of the left kidney; there is a 0.8 cm stone  at the interpole region of the right kidney.  Large bilateral renal cysts are seen, measuring up to 8.3 cm in size.  Mild nonspecific perinephric stranding is noted bilaterally.  There is no evidence of hydronephrosis.  No obstructing ureteral stones are seen.  No significant free fluid is seen within the abdomen or pelvis.  No acute vascular abnormalities are seen.  Scattered calcification is noted along the abdominal aorta and its branches.  The bladder is mildly distended and grossly unremarkable in appearance.  The patient is status post hysterectomy.  The ovaries are grossly symmetric; no suspicious adnexal masses are seen.  No inguinal lymphadenopathy is seen.  No acute osseous abnormalities are identified.  Multilevel vacuum phenomenon is noted along the lumbar spine.  IMPRESSION:  1.  Right basilar airspace opacity, concerning for pneumonia. 2.  Mild distension of the stomach with fluid and air, without evidence of distal obstruction.  The duodenum is largely decompressed and grossly unremarkable in appearance.  No evidence of more distal small bowel obstruction; visualized small bowel loops are partially filled with fluid and appear grossly unremarkable. 3.  As on the prior study, there are three anterior abdominal wall hernias containing loops of small bowel, the largest of which involves the patient's left-sided colostomy.  No evidence of strangulation; no associated soft tissue inflammation seen. 4.  Bilateral renal stones again seen, measuring up to 1.8 cm on the left side.  Bilateral renal cysts noted, measuring up to 8.3 cm in size. 5.  Scattered calcification along the abdominal aorta and its branches. 6.  Diffuse coronary artery calcifications seen.   Original Report Authenticated By: Tonia Ghent, M.D.   Dg Chest Portable 1 View  06/18/2012   *RADIOLOGY REPORT*  Clinical Data: Vomiting; shortness of breath.  PORTABLE CHEST - 1 VIEW  Comparison: Chest radiograph performed earlier today at 10:49  p.m.  Findings:  The lungs are mildly hypoexpanded.  Mild chronic peribronchial thickening is again seen.  No focal consolidation, pleural effusion or pneumothorax is identified.  The cardiomediastinal silhouette is borderline enlarged.  No acute osseous abnormalities are seen.  IMPRESSION: Lungs mildly hypoexpanded; no acute cardiopulmonary process seen. Mild chronic peribronchial thickening noted; borderline cardiomegaly.   Original Report Authenticated By: Tonia Ghent, M.D.   Dg Abd Acute W/chest  06/17/2012   *RADIOLOGY REPORT*  Clinical Data: Diffuse abdominal pain.  ACUTE ABDOMEN SERIES (ABDOMEN 2 VIEW & CHEST 1 VIEW)  Comparison: Chest radiograph performed 03/13/2012, and CT of the abdomen and pelvis performed 01/20/2010  Findings: The lungs are well-aerated.  Mild chronic peribronchial thickening is seen.  There is no evidence of focal opacification, pleural effusion or pneumothorax.  The cardiomediastinal silhouette is borderline normal in size.  The visualized bowel gas pattern is unremarkable.  Scattered fluid and air are seen within the colon; there is no evidence of small bowel dilatation to suggest obstruction. Scattered small air-fluid levels on the decubitus view likely remain within normal limits, without evidence of obstruction.  No free intra-abdominal air is identified on the provided decubitus view.  The stomach is mildly distended with air and fluid.  No acute osseous abnormalities are seen; the sacroiliac joints are unremarkable in appearance.  IMPRESSION:  1.  Unremarkable bowel gas pattern; no free intra-abdominal air seen.  The stomach is mildly distended with fluid and air. 2.  Mild chronic peribronchial thickening seen; lungs otherwise clear.   Original Report Authenticated By: Tonia Ghent, M.D.    Microbiology: Recent Results (from the past 240 hour(s))  URINE CULTURE     Status: None   Collection Time    06/18/12 12:47 AM      Result Value Range Status   Specimen  Description URINE, CATHETERIZED   Final   Special Requests NONE   Final   Culture  Setup Time 06/18/2012 10:48   Final   Colony Count >=100,000 COLONIES/ML   Final   Culture ENTEROCOCCUS SPECIES   Final   Report Status 06/20/2012 FINAL   Final   Organism ID, Bacteria ENTEROCOCCUS SPECIES   Final  CULTURE, BLOOD (ROUTINE X 2)     Status: None   Collection Time    06/18/12  1:30 AM      Result Value Range Status   Specimen Description BLOOD RIGHT ARM   Final   Special Requests BOTTLES DRAWN AEROBIC AND ANAEROBIC 10CC   Final   Culture  Setup Time 06/18/2012 10:19   Final   Culture     Final   Value:        BLOOD CULTURE RECEIVED NO GROWTH TO DATE CULTURE WILL BE HELD FOR 5 DAYS BEFORE ISSUING A FINAL NEGATIVE REPORT   Report Status PENDING   Incomplete  CULTURE, BLOOD (ROUTINE X 2)     Status: None   Collection Time    06/18/12  1:40 AM      Result Value Range Status   Specimen Description BLOOD RIGHT HAND   Final   Special Requests BOTTLES DRAWN AEROBIC ONLY 4CC   Final   Culture  Setup Time 06/18/2012 10:17   Final   Culture     Final   Value:        BLOOD CULTURE RECEIVED NO GROWTH TO DATE CULTURE WILL BE HELD FOR 5 DAYS BEFORE ISSUING A FINAL NEGATIVE REPORT   Report Status PENDING   Incomplete  MRSA PCR SCREENING  Status: None   Collection Time    06/18/12  4:02 AM      Result Value Range Status   MRSA by PCR NEGATIVE  NEGATIVE Final   Comment:            The GeneXpert MRSA Assay (FDA     approved for NASAL specimens     only), is one component of a     comprehensive MRSA colonization     surveillance program. It is not     intended to diagnose MRSA     infection nor to guide or     monitor treatment for     MRSA infections.     Labs: Basic Metabolic Panel:  Recent Labs Lab 06/17/12 2221 06/18/12 0217 06/18/12 0735 06/20/12 0410 06/21/12 0400 06/22/12 0510  NA 143  --  138 147* 143 139  K 4.1  --  3.7 3.7 3.1* 3.6  CL 100  --  102 110 107 106  CO2 31   --  22 25 24 22   GLUCOSE 118*  --  155* 108* 89 126*  BUN 14  --  15 12 11 13   CREATININE 0.87 0.66 0.71 0.62 0.56 0.54  CALCIUM 10.2  --  8.7 9.1 8.9 8.7   Liver Function Tests:  Recent Labs Lab 06/17/12 2221 06/18/12 0735  AST 21 15  ALT 17 13  ALKPHOS 106 85  BILITOT 0.4 0.5  PROT 8.4* 6.7  ALBUMIN 4.0 3.1*    Recent Labs Lab 06/17/12 2221  LIPASE 10*   No results found for this basename: AMMONIA,  in the last 168 hours CBC:  Recent Labs Lab 06/17/12 2221 06/18/12 0217 06/18/12 0745 06/20/12 0410  WBC 16.6* 11.9* 10.9* 9.2  NEUTROABS 13.6*  --  7.8*  --   HGB 18.0* 16.5* 15.5* 15.4*  HCT 52.0* 48.0* 45.9 44.6  MCV 88.3 88.6 89.5 87.5  PLT 182 160 166 185   Cardiac Enzymes: No results found for this basename: CKTOTAL, CKMB, CKMBINDEX, TROPONINI,  in the last 168 hours BNP: BNP (last 3 results) No results found for this basename: PROBNP,  in the last 8760 hours CBG:  Recent Labs Lab 06/21/12 0605 06/21/12 1123 06/21/12 1618 06/21/12 2142 06/22/12 0606  GLUCAP 83 98 106* 116* 123*   Signed:  Kelbi Renstrom L  [facility] 06/22/2012, 10:16 AM

## 2012-06-23 ENCOUNTER — Encounter: Payer: Self-pay | Admitting: Internal Medicine

## 2012-06-23 ENCOUNTER — Non-Acute Institutional Stay (SKILLED_NURSING_FACILITY): Payer: Medicare Other | Admitting: Internal Medicine

## 2012-06-23 DIAGNOSIS — IMO0002 Reserved for concepts with insufficient information to code with codable children: Secondary | ICD-10-CM

## 2012-06-23 DIAGNOSIS — E559 Vitamin D deficiency, unspecified: Secondary | ICD-10-CM

## 2012-06-23 DIAGNOSIS — Z8673 Personal history of transient ischemic attack (TIA), and cerebral infarction without residual deficits: Secondary | ICD-10-CM

## 2012-06-23 DIAGNOSIS — M792 Neuralgia and neuritis, unspecified: Secondary | ICD-10-CM

## 2012-06-23 DIAGNOSIS — E1149 Type 2 diabetes mellitus with other diabetic neurological complication: Secondary | ICD-10-CM

## 2012-06-23 DIAGNOSIS — I672 Cerebral atherosclerosis: Secondary | ICD-10-CM

## 2012-06-23 DIAGNOSIS — J690 Pneumonitis due to inhalation of food and vomit: Secondary | ICD-10-CM

## 2012-06-23 DIAGNOSIS — K5900 Constipation, unspecified: Secondary | ICD-10-CM

## 2012-06-23 DIAGNOSIS — K5909 Other constipation: Secondary | ICD-10-CM

## 2012-06-23 NOTE — Progress Notes (Signed)
Patient ID: Shelia Webb, female   DOB: [birth year], 79 y.o.   MRN: [id number] Code Status: DNR   Allergies  Allergen Reactions  . Penicillins Other (See Comments)    Redness and skin irritation    Chief Complaint  Patient presents with  . Hospitalization Follow-up    new admission s/p hospitalization for aspiration pneumonia    HPI: Patient is a 79 y.o. white female seen today for readmission s/p hospitalization with aspiration pneumonia.    Review of Systems:  Review of Systems  Constitutional: Positive for malaise/fatigue. Negative for fever.  Eyes: Positive for blurred vision.  Respiratory: Positive for cough and sputum production. Negative for shortness of breath and wheezing.   Cardiovascular: Negative for chest pain.  Gastrointestinal: Negative for constipation.  Genitourinary: Negative for dysuria.  Musculoskeletal: Negative for falls.  Skin: Negative for rash.  Neurological: Negative for loss of consciousness and headaches.  Endo/Heme/Allergies: Bruises/bleeds easily.  Psychiatric/Behavioral: Positive for depression and memory loss.     Past Medical History  Diagnosis Date  . Hypertension   . Diabetes mellitus   . Dysrhythmia     a-fib  . Anemia   . Acute respiratory failure   . Chronic kidney disease     Kidney failure  . Allergic rhinitis   . Osteoarthrosis   . Osteoporosis   . Anxiety   . Depression   . Restless leg syndrome   . GERD (gastroesophageal reflux disease)   . Hypertonic bladder   . Peripheral vascular disease   . Coronary atherosclerosis due to lipid rich plaque   . DNR (do not resuscitate)    Past Surgical History  Procedure Laterality Date  . Colon surgery      has colostomy  . Abdominal hysterectomy    . Appendectomy    . Tonsillectomy    . Cardiac catheterization      bilateral cataract surgery  . Diaphragmatic hernia repair      with gangrene  . Orif patella  12/07/2010    Procedure: OPEN REDUCTION INTERNAL (ORIF) FIXATION  PATELLA;  Surgeon: Nadara Mustard, MD;  Location: [facility] OR;  Service: Orthopedics;  Laterality: Left;  Left Knee Hamstring Release and Open Reduction Internal Fixation Patella  . Orif patella  12/21/2010    Procedure: OPEN REDUCTION INTERNAL (ORIF) FIXATION PATELLA;  Surgeon: Nadara Mustard, MD;  Location: [facility] OR;  Service: Orthopedics;  Laterality: Left;  Left Knee Hamstring Release and Open Reduction Internal Fixation Patella   Social History:   reports that she has never smoked. She has never used smokeless tobacco. She reports that she does not drink alcohol or use illicit drugs.  No family history on file.  Medications: Patient's Medications  New Prescriptions   IPRATROPIUM-ALBUTEROL (DUONEB) 0.5-2.5 (3) MG/3ML SOLN    Take 3 mLs by nebulization every 6 (six) hours as needed.  Previous Medications   ARTIFICIAL TEAR OINTMENT (LACRI-LUBE OP)    Place 1 drop into both eyes at bedtime.    ASPIRIN EC 81 MG TABLET    Take 81 mg by mouth daily.   BACLOFEN (LIORESAL) 5 MG TABS    Take 5 mg by mouth 3 (three) times daily as needed (neck spasms).   CALCIUM-VITAMIN D (OSCAL WITH D) 500-200 MG-UNIT PER TABLET    Take 1 tablet by mouth daily.     CYCLOSPORINE (RESTASIS) 0.05 % OPHTHALMIC EMULSION    Place 1 drop into both eyes 2 (two) times daily.     DICLOFENAC SODIUM (  VOLTAREN) 1 % GEL    Apply 1 application topically 4 (four) times daily as needed (knee pain). For both knees   FOOD THICKENER (THICK IT) POWD    Take 1 Container by mouth as needed (for honey thick liquids).   FUROSEMIDE (LASIX) 20 MG TABLET    Take 20 mg by mouth daily.     GABAPENTIN (NEURONTIN) 300 MG CAPSULE    Take 300 mg by mouth 3 (three) times daily.    INSULIN DETEMIR (LEVEMIR) 100 UNIT/ML INJECTION    Inject 15 Units into the skin at bedtime.    LORAZEPAM (ATIVAN) 0.5 MG TABLET    Take 0.5 mg by mouth every 6 (six) hours as needed for anxiety.   METOPROLOL SUCCINATE (TOPROL-XL) 25 MG 24 HR TABLET    Take 25 mg by mouth daily.      MULTIPLE VITAMINS-MINERALS (MULTIVITAMINS THER. W/MINERALS) TABS    Take 1 tablet by mouth daily.     OMEPRAZOLE (PRILOSEC OTC) 20 MG TABLET    Take 20 mg by mouth daily.   ROPINIROLE (REQUIP) 0.25 MG TABLET    Take 0.25 mg by mouth 3 (three) times daily.    SENNA (SENOKOT) 8.6 MG TABS    Take 1 tablet by mouth at bedtime.     TRAZODONE (DESYREL) 50 MG TABLET    Take 1 tablet (50 mg total) by mouth at bedtime as needed for sleep. For insomnia   VENLAFAXINE (EFFEXOR) 37.5 MG TABLET    Take 37.5 mg by mouth 2 (two) times daily.   VITAMIN D, ERGOCALCIFEROL, (DRISDOL) 50000 UNITS CAPS    Take 50,000 Units by mouth every 7 (seven) days. mondays  Modified Medications   Modified Medication Previous Medication   OXYCODONE-ACETAMINOPHEN (PERCOCET/ROXICET) 5-325 MG PER TABLET oxyCODONE-acetaminophen (PERCOCET/ROXICET) 5-325 MG per tablet      Take one tablet by mouth every 6 hours as needed For pain    Take 1 tablet by mouth every 6 (six) hours as needed. For pain  Discontinued Medications   ALBUTEROL (PROVENTIL HFA;VENTOLIN HFA) 108 (90 BASE) MCG/ACT INHALER    Inhale 2 puffs into the lungs every 6 (six) hours as needed for wheezing.   DOXYCYCLINE (VIBRA-TABS) 100 MG TABLET    Take 1 tablet (100 mg total) by mouth every 12 (twelve) hours. For 3 more days   DULOXETINE (CYMBALTA) 60 MG CAPSULE    Take 60 mg by mouth daily.     FLUTICASONE-SALMETEROL (ADVAIR) 100-50 MCG/DOSE AEPB    Inhale 1 puff into the lungs daily.    INSULIN ASPART (NOVOLOG) 100 UNIT/ML INJECTION    Inject 8 Units into the skin 3 (three) times daily before meals. For cbg > 200 Do Not give at bedtime   MOXIFLOXACIN (AVELOX) 400 MG TABLET    Take 1 tablet (400 mg total) by mouth daily. For 3 more days   OXYCODONE-ACETAMINOPHEN (PERCOCET) 5-325 MG PER TABLET    Take 1 tablet by mouth every 6 (six) hours as needed. For pain   PROMETHAZINE (PHENERGAN) 25 MG SUPPOSITORY    Place 25 mg rectally every 6 (six) hours as needed for nausea.      Physical Exam: Filed Vitals:   06/23/12 1347  BP: 126/64  Pulse: 78  Temp: 97.7 F (36.5 C)  Resp: 20  Height: 5' (1.524 m)  Weight: 151 lb 9.6 oz (68.765 kg)  SpO2: 96%   Physical Exam  Constitutional: She appears well-developed and well-nourished. No distress.  HENT:  Head: Normocephalic  and atraumatic.  Right Ear: External ear normal.  Left Ear: External ear normal.  Nose: Nose normal.  Mouth/Throat: Oropharynx is clear and moist. No oropharyngeal exudate.  Eyes: Conjunctivae and EOM are normal. Pupils are equal, round, and reactive to light.  Wears dark sunglasses  Neck: Normal range of motion. Neck supple. No JVD present. No tracheal deviation present. No thyromegaly present.  Cardiovascular: Normal rate, regular rhythm, normal heart sounds and intact distal pulses.   Pulmonary/Chest: Effort normal.  Few rhonchi remain  Abdominal: Soft. Bowel sounds are normal. She exhibits no distension. There is no tenderness.  Musculoskeletal: She exhibits edema. She exhibits no tenderness.  Uses wheelchair  Lymphadenopathy:    She has no cervical adenopathy.  Neurological: She is alert.  Skin: Skin is warm and dry.  Psychiatric: She has a normal mood and affect.      Labs reviewed: Basic Metabolic Panel:  Recent Labs  11/91/47 0410 06/21/12 0400 06/22/12 0510  NA 147* 143 139  K 3.7 3.1* 3.6  CL 110 107 106  CO2 25 24 22   GLUCOSE 108* 89 126*  BUN 12 11 13   CREATININE 0.62 0.56 0.54  CALCIUM 9.1 8.9 8.7   Liver Function Tests:  Recent Labs  06/17/12 2221 06/18/12 0735  AST 21 15  ALT 17 13  ALKPHOS 106 85  BILITOT 0.4 0.5  PROT 8.4* 6.7  ALBUMIN 4.0 3.1*    Recent Labs  06/17/12 2221  LIPASE 10*   No results found for this basename: AMMONIA,  in the last 8760 hours CBC:  Recent Labs  06/17/12 2221 06/18/12 0217 06/18/12 0745 06/20/12 0410  WBC 16.6* 11.9* 10.9* 9.2  NEUTROABS 13.6*  --  7.8*  --   HGB 18.0* 16.5* 15.5* 15.4*  HCT  52.0* 48.0* 45.9 44.6  MCV 88.3 88.6 89.5 87.5  PLT 182 160 166 185   Lab Results  Component Value Date   HGBA1C 11.3* 03/22/2011    Past Procedures: Ct Abdomen Pelvis Wo Contrast  06/18/2012 *RADIOLOGY REPORT* Clinical Data: Feculent emesis; history of multiple abdominal surgeries. CT ABDOMEN AND PELVIS WITHOUT CONTRAST Technique: Multidetector CT imaging of the abdomen and pelvis was performed following the standard protocol without intravenous contrast. Comparison: CT of the abdomen and pelvis from 01/20/2010, and abdominal radiographs performed earlier today at 10:49 p.m. Findings: Right basilar airspace opacity raises concern for pneumonia; this was not well characterized on the recent chest radiograph. Diffuse coronary artery calcifications are seen. There is mild distension of the stomach with fluid and air. The duodenum is unremarkable in appearance, containing a small amount of fluid. Scattered air and fluid are seen filling the jejunum and ileum, without evidence of bowel dilatation to suggest obstruction. Visualized small bowel loops remain normal in caliber. As on the prior study, there are three anterior abdominal wall hernias. The largest reflects the patient's left mid abdominal colostomy; an associated long segment of herniated small bowel is seen. There is also a long segment of small bowel within a broad- based right lower quadrant anterior abdominal wall hernia. In addition, a small hernia is seen to the left of this hernia, slightly increased in size from the prior study and again containing a relatively short segment of ileum. There is no evidence of strangulation at this time. No significant soft tissue inflammation is seen with respect to any of these hernias. A broader bulge is noted to the right of the colostomy hernia, also grossly unchanged from the prior study. The remaining  colon is grossly unremarkable, containing a small amount of fluid. Residual stool is noted within the  patient's Hartmann's pouch. The liver and spleen are unremarkable in appearance. The gallbladder is within normal limits. There is diffuse atrophy of the pancreas. The adrenal glands are grossly unremarkable in appearance. A 1.8 cm stone is noted at the interpole region of the left kidney; there is a 0.8 cm stone at the interpole region of the right kidney. Large bilateral renal cysts are seen, measuring up to 8.3 cm in size. Mild nonspecific perinephric stranding is noted bilaterally. There is no evidence of hydronephrosis. No obstructing ureteral stones are seen. No significant free fluid is seen within the abdomen or pelvis. No acute vascular abnormalities are seen. Scattered calcification is noted along the abdominal aorta and its branches. The bladder is mildly distended and grossly unremarkable in appearance. The patient is status post hysterectomy. The ovaries are grossly symmetric; no suspicious adnexal masses are seen. No inguinal lymphadenopathy is seen. No acute osseous abnormalities are identified. Multilevel vacuum phenomenon is noted along the lumbar spine. IMPRESSION: 1. Right basilar airspace opacity, concerning for pneumonia. 2. Mild distension of the stomach with fluid and air, without evidence of distal obstruction. The duodenum is largely decompressed and grossly unremarkable in appearance. No evidence of more distal small bowel obstruction; visualized small bowel loops are partially filled with fluid and appear grossly unremarkable. 3. As on the prior study, there are three anterior abdominal wall hernias containing loops of small bowel, the largest of which involves the patient's left-sided colostomy. No evidence of strangulation; no associated soft tissue inflammation seen. 4. Bilateral renal stones again seen, measuring up to 1.8 cm on the left side. Bilateral renal cysts noted, measuring up to 8.3 cm in size. 5. Scattered calcification along the abdominal aorta and its branches. 6. Diffuse  coronary artery calcifications seen. Original Report Authenticated By: Tonia Ghent, M.D.   Dg Chest Portable 1 View  06/18/2012 *RADIOLOGY REPORT* Clinical Data: Vomiting; shortness of breath. PORTABLE CHEST - 1 VIEW Comparison: Chest radiograph performed earlier today at 10:49 p.m. Findings: The lungs are mildly hypoexpanded. Mild chronic peribronchial thickening is again seen. No focal consolidation, pleural effusion or pneumothorax is identified. The cardiomediastinal silhouette is borderline enlarged. No acute osseous abnormalities are seen. IMPRESSION: Lungs mildly hypoexpanded; no acute cardiopulmonary process seen. Mild chronic peribronchial thickening noted; borderline cardiomegaly. Original Report Authenticated By: Tonia Ghent, M.D.   Dg Abd Acute W/chest  06/17/2012 *RADIOLOGY REPORT* Clinical Data: Diffuse abdominal pain. ACUTE ABDOMEN SERIES (ABDOMEN 2 VIEW & CHEST 1 VIEW) Comparison: Chest radiograph performed 03/13/2012, and CT of the abdomen and pelvis performed 01/20/2010 Findings: The lungs are well-aerated. Mild chronic peribronchial thickening is seen. There is no evidence of focal opacification, pleural effusion or pneumothorax. The cardiomediastinal silhouette is borderline normal in size. The visualized bowel gas pattern is unremarkable. Scattered fluid and air are seen within the colon; there is no evidence of small bowel dilatation to suggest obstruction. Scattered small air-fluid levels on the decubitus view likely remain within normal limits, without evidence of obstruction. No free intra-abdominal air is identified on the provided decubitus view. The stomach is mildly distended with air and fluid. No acute osseous abnormalities are seen; the sacroiliac joints are unremarkable in appearance. IMPRESSION: 1. Unremarkable bowel gas pattern; no free intra-abdominal air seen. The stomach is mildly distended with fluid and air. 2. Mild chronic peribronchial thickening seen; lungs  otherwise clear. Original Report Authenticated By: Tonia Ghent, M.D.   Assessment/Plan  1. Aspiration pneumonia -to complete abx course -aspiration precautions -speech therapy eval and tx  2. Type II or unspecified type diabetes mellitus with neurological manifestations, not stated as uncontrolled(250.60) -cont current therapy -monitor q 4 monthly hba1c -cont baby asa, not on ace (unsure why?)  3. Neuropathic pain -cont current therapy for this, monitor for sedation, due to DMII  4. Cerebrovascular disease, arteriosclerotic, post-stroke -cont secondary prevention strategies with bp, lipid and glucose control  5. Chronic constipation -cont bowel regimen and monitor BMs  6. Vitamin D deficiency disease -cont repletion, f/u level in a few months

## 2012-06-24 LAB — CULTURE, BLOOD (ROUTINE X 2): Culture: NO GROWTH

## 2012-07-07 ENCOUNTER — Other Ambulatory Visit: Payer: Self-pay | Admitting: *Deleted

## 2012-07-07 MED ORDER — OXYCODONE-ACETAMINOPHEN 5-325 MG PO TABS: 1.0000 | ORAL_TABLET | Freq: Four times a day (QID) | ORAL | Status: DC | PRN

## 2012-07-23 ENCOUNTER — Non-Acute Institutional Stay (SKILLED_NURSING_FACILITY): Payer: Medicare Other | Admitting: Internal Medicine

## 2012-07-23 DIAGNOSIS — I2510 Atherosclerotic heart disease of native coronary artery without angina pectoris: Secondary | ICD-10-CM

## 2012-07-23 DIAGNOSIS — G2581 Restless legs syndrome: Secondary | ICD-10-CM

## 2012-07-23 DIAGNOSIS — E1140 Type 2 diabetes mellitus with diabetic neuropathy, unspecified: Secondary | ICD-10-CM

## 2012-07-23 DIAGNOSIS — F329 Major depressive disorder, single episode, unspecified: Secondary | ICD-10-CM

## 2012-07-23 DIAGNOSIS — E1142 Type 2 diabetes mellitus with diabetic polyneuropathy: Secondary | ICD-10-CM

## 2012-07-23 DIAGNOSIS — K219 Gastro-esophageal reflux disease without esophagitis: Secondary | ICD-10-CM

## 2012-07-23 DIAGNOSIS — E119 Type 2 diabetes mellitus without complications: Secondary | ICD-10-CM

## 2012-07-23 DIAGNOSIS — I10 Essential (primary) hypertension: Secondary | ICD-10-CM

## 2012-07-23 DIAGNOSIS — E1149 Type 2 diabetes mellitus with other diabetic neurological complication: Secondary | ICD-10-CM

## 2012-07-23 NOTE — Progress Notes (Signed)
Patient ID: Shelia Webb, female   DOB: March 08, 1921, 79 y.o.   MRN: [id number]   Code Status: DNR  Allergies  Allergen Reactions  . Penicillins Other (See Comments)    Redness and skin irritation    Chief Complaint: medical management of chronic illness  HPI:  79 y/o pleasant female was seen in her room today. She has lost her son recently. She denies any complaints this visit. No concerns from the staff cbg ranging between 94- 180 with one reading in 80 and one in 258   Review of Systems:  Constitutional:  Negative for fever, chills HENT: Negative for ear pain and sore throat.   Respiratory:  Negative for shortness of breath and wheezing.   Cardiovascular: Negative for chest pain, palpitations and leg swelling.   Gastrointestinal: Negative for heartburn, nausea, vomiting, abdominal pain and diarrhea.   Genitourinary: Negative for dysuria, urgency and frequency.   Musculoskeletal: Negative for back pain and falls.   Skin: Negative for rash.   Neurological: weakness present, Negative for dizziness, loss of consciousness and headaches.   Endo/Heme/Allergies: Does not bruise/bleed easily.   Psychiatric/Behavioral: Positive for depression and memory loss. The patient does not have insomnia.    Past Medical History  Diagnosis Date  . Hypertension   . Diabetes mellitus   . Dysrhythmia     a-fib  . Anemia   . Acute respiratory failure   . Chronic kidney disease     Kidney failure  . Allergic rhinitis   . Osteoarthrosis   . Osteoporosis   . Anxiety   . Depression   . Restless leg syndrome   . GERD (gastroesophageal reflux disease)   . Hypertonic bladder   . Peripheral vascular disease   . Coronary atherosclerosis due to lipid rich plaque   . DNR (do not resuscitate)    Past Surgical History  Procedure Laterality Date  . Colon surgery      has colostomy  . Abdominal hysterectomy    . Appendectomy    . Tonsillectomy    . Cardiac catheterization      bilateral cataract  surgery  . Diaphragmatic hernia repair      with gangrene  . Orif patella  12/07/2010    Procedure: OPEN REDUCTION INTERNAL (ORIF) FIXATION PATELLA;  Surgeon: Nadara Mustard, MD;  Location: [facility] OR;  Service: Orthopedics;  Laterality: Left;  Left Knee Hamstring Release and Open Reduction Internal Fixation Patella  . Orif patella  12/21/2010    Procedure: OPEN REDUCTION INTERNAL (ORIF) FIXATION PATELLA;  Surgeon: Nadara Mustard, MD;  Location: [facility] OR;  Service: Orthopedics;  Laterality: Left;  Left Knee Hamstring Release and Open Reduction Internal Fixation Patella   Social History:   reports that she has never smoked. She has never used smokeless tobacco. She reports that she does not drink alcohol or use illicit drugs.  No family history on file.  Medications: Patient's Medications  New Prescriptions   No medications on file  Previous Medications   ALBUTEROL (PROVENTIL HFA;VENTOLIN HFA) 108 (90 BASE) MCG/ACT INHALER    Inhale 2 puffs into the lungs every 6 (six) hours as needed for wheezing.   ARTIFICIAL TEAR OINTMENT (LACRI-LUBE OP)    Place 1 drop into both eyes at bedtime.    ASPIRIN EC 81 MG TABLET    Take 81 mg by mouth daily.   BACLOFEN (LIORESAL) 5 MG TABS    Take 5 mg by mouth 3 (three) times daily as needed (neck spasms).  CALCIUM-VITAMIN D (OSCAL WITH D) 500-200 MG-UNIT PER TABLET    Take 1 tablet by mouth daily.     CYCLOSPORINE (RESTASIS) 0.05 % OPHTHALMIC EMULSION    Place 1 drop into both eyes 2 (two) times daily.     DICLOFENAC SODIUM (VOLTAREN) 1 % GEL    Apply 1 application topically 4 (four) times daily as needed (knee pain). For both knees   DULOXETINE (CYMBALTA) 60 MG CAPSULE    Take 60 mg by mouth daily.     FLUTICASONE-SALMETEROL (ADVAIR) 100-50 MCG/DOSE AEPB    Inhale 1 puff into the lungs daily.    FUROSEMIDE (LASIX) 20 MG TABLET    Take 20 mg by mouth daily.     GABAPENTIN (NEURONTIN) 300 MG CAPSULE    Take 300 mg by mouth 2 (two) times daily.   INSULIN ASPART  (NOVOLOG) 100 UNIT/ML INJECTION    Inject 10 Units into the skin 3 (three) times daily before meals. Do Not give at bedtime   INSULIN DETEMIR (LEVEMIR) 100 UNIT/ML INJECTION    Inject 20 Units into the skin 2 (two) times daily. 15units at bedtime 20 units daily   LORAZEPAM (ATIVAN) 0.5 MG TABLET    Take 0.5 mg by mouth every 6 (six) hours as needed for anxiety.   METOPROLOL SUCCINATE (TOPROL-XL) 25 MG 24 HR TABLET    Take 25 mg by mouth daily.     MULTIPLE VITAMINS-MINERALS (MULTIVITAMINS THER. W/MINERALS) TABS    Take 1 tablet by mouth daily.     OMEPRAZOLE (PRILOSEC OTC) 20 MG TABLET    Take 20 mg by mouth daily.   OXYCODONE-ACETAMINOPHEN (PERCOCET/ROXICET) 5-325 MG PER TABLET    Take 1 tablet by mouth every 6 (six) hours as needed. For pain   PROMETHAZINE (PHENERGAN) 25 MG SUPPOSITORY    Place 25 mg rectally every 6 (six) hours as needed for nausea.   ROPINIROLE (REQUIP) 0.25 MG TABLET    Take 0.25 mg by mouth 2 (two) times daily.     SENNA (SENOKOT) 8.6 MG TABS    Take 1 tablet by mouth at bedtime.     TRAZODONE (DESYREL) 50 MG TABLET    Take 1 tablet (50 mg total) by mouth at bedtime as needed for sleep. For insomnia   VENLAFAXINE (EFFEXOR) 37.5 MG TABLET    Take 37.5 mg by mouth 2 (two) times daily.   VITAMIN D, ERGOCALCIFEROL, (DRISDOL) 50000 UNITS CAPS    Take 50,000 Units by mouth every 7 (seven) days. mondays  Modified Medications   No medications on file  Discontinued Medications   DOXYCYCLINE (VIBRA-TABS) 100 MG TABLET    Take 1 tablet (100 mg total) by mouth every 12 (twelve) hours. For 3 more days   MOXIFLOXACIN (AVELOX) 400 MG TABLET    Take 1 tablet (400 mg total) by mouth daily. For 3 more days     Physical Exam: Filed Vitals:   07/23/12 1052  BP: 130/73  Pulse: 71  Temp: 96.9 F (36.1 C)  Resp: 18  Height: 5' (1.524 m)  Weight: 153 lb (69.4 kg)  SpO2: 96%   Constitutional: No distress.   HENT: Normocephalic and atraumatic. No nasal congestion Eyes: Pupils are  equal, round, and reactive to light.   Neck: No JVD present.   Cardiovascular: Normal rate, regular rhythm and normal heart sounds. Exam reveals no gallop and no friction rub. No murmur heard.   Pulmonary/Chest: No respiratory distress. She has no wheezes. She has no rales. She  exhibits no tenderness.  Abdominal: Soft. Bowel sounds are normal. She exhibits no distension and no mass. There is no tenderness. There is no rebound and no guarding.  Genitourinary: Bladder nontender, nondistended  Musculoskeletal: She exhibits no edema and no tenderness. No erythema. Skin intact. Lymphadenopathy: She has no cervical adenopathy.  Neurological: She is alert. Oriented to person and place, not to time   Skin: Skin is warm and dry. She is not diaphoretic.  Psychiatric: She has a normal mood and affect. Cognition and memory are impaired. She expresses inappropriate judgment. She exhibits abnormal recent memory.    Labs reviewed: Basic Metabolic Panel:  Recent Labs  16/10/96 0410 06/21/12 0400 06/22/12 0510  NA 147* 143 139  K 3.7 3.1* 3.6  CL 110 107 106  CO2 25 24 22   GLUCOSE 108* 89 126*  BUN 12 11 13   CREATININE 0.62 0.56 0.54  CALCIUM 9.1 8.9 8.7   Liver Function Tests:  Recent Labs  06/17/12 2221 06/18/12 0735  AST 21 15  ALT 17 13  ALKPHOS 106 85  BILITOT 0.4 0.5  PROT 8.4* 6.7  ALBUMIN 4.0 3.1*    Recent Labs  06/17/12 2221  LIPASE 10*   No results found for this basename: AMMONIA,  in the last 8760 hours CBC:  Recent Labs  06/17/12 2221 06/18/12 0217 06/18/12 0745 06/20/12 0410  WBC 16.6* 11.9* 10.9* 9.2  NEUTROABS 13.6*  --  7.8*  --   HGB 18.0* 16.5* 15.5* 15.4*  HCT 52.0* 48.0* 45.9 44.6  MCV 88.3 88.6 89.5 87.5  PLT 182 160 166 185   Cardiac Enzymes: No results found for this basename: CKTOTAL, CKMB, CKMBINDEX, TROPONINI,  in the last 8760 hours BNP: No components found with this basename: POCBNP,  CBG:  Recent Labs  06/21/12 2142  06/22/12 0606 06/22/12 1107  GLUCAP 116* 123* 132*    04/15/12 wbc 5.2, hb 14.5, hct 43.7, plt 144, na 139, k 3.8, bun 16, cr 0.89, ca 9.2, tsh 1.053, vit b12 376, folate 14.9  05/04/12 a1c 7  Assessment/Plan  Dm- cbg under better control. Will decrease levemir to 15 u bid for now and novolog to 8 u pre meal for cbg > 200. Continue to monitor cbg. Continue asa and gabapentin  CAD- remains chest pain free, on b blocker and asa  Chronic pain- continue prn muscle relaxant, neurontin and percocet  gerd- continue ppi, symptoms controlled  Restless leg syndrome- tolerating requip well, continue current dosage  Depression- stable, continue effexor and trazodone with  cymbalta, monitor for symptoms. On prn ativan for anxiety  htn- bp remains under control. Continue toprol xl and lasix

## 2012-07-30 ENCOUNTER — Non-Acute Institutional Stay (SKILLED_NURSING_FACILITY): Payer: Medicare Other | Admitting: Internal Medicine

## 2012-07-30 DIAGNOSIS — M79609 Pain in unspecified limb: Secondary | ICD-10-CM

## 2012-07-30 DIAGNOSIS — G2581 Restless legs syndrome: Secondary | ICD-10-CM

## 2012-07-30 DIAGNOSIS — E1140 Type 2 diabetes mellitus with diabetic neuropathy, unspecified: Secondary | ICD-10-CM | POA: Insufficient documentation

## 2012-07-30 DIAGNOSIS — I10 Essential (primary) hypertension: Secondary | ICD-10-CM

## 2012-07-30 DIAGNOSIS — E1149 Type 2 diabetes mellitus with other diabetic neurological complication: Secondary | ICD-10-CM

## 2012-07-30 DIAGNOSIS — E119 Type 2 diabetes mellitus without complications: Secondary | ICD-10-CM

## 2012-07-30 DIAGNOSIS — E1142 Type 2 diabetes mellitus with diabetic polyneuropathy: Secondary | ICD-10-CM

## 2012-07-30 NOTE — Progress Notes (Signed)
Patient ID: AMINATA BUFFALO, female   DOB: [birth year], 79 y.o.   MRN: [id number]  Chief Complaint  Patient presents with  . Leg Pain   Allergies  Allergen Reactions  . Penicillins Other (See Comments)    Redness and skin irritation   HPI 79 y/o female patient with history of diabetic neuropathy is complaining of worsening poking pain in her feet and legs for 3-4 days. This prevents her from sleeping at night. No other complaints Denies any focal weakness Denies muscle cramps Denies chest pain Denies tingling sensation  Ros- Negative for fever or chills Negative for nausea or vomiting Negative for chest pain or SOB Negative for abdominal pain  BP 136/78  Pulse 87  Temp(Src) 97.2 F (36.2 C)  Resp 18  SpO2 96%  gen- elderly female in NAD heent- no pallor, no LAD, MMM cvs- ns 1,s 2, rrr respi CTAB Ext- able to move all 4, no calf tenderness Neuro- aao x 2, no focal deficit, normal sensation to touch and pinprick at present  Assessment/plan-  Leg pain- new onset, has hx of peripheral neuropathy and RLS and there is likely worsening of her symptoms. Will increase her requip to 0.25 mg tid for now and her gabapentin will be chnaged to 200 mg tid from 300 mg bid. Reassess her pain. Will d/c baclofen as patient has not required it recently. Can continue prn oxycodone. Monitor  DM- stable on current regimen. Monitor cbg and continue insulin  HTN- stable with current regimen. monitor

## 2012-08-08 DIAGNOSIS — F329 Major depressive disorder, single episode, unspecified: Secondary | ICD-10-CM | POA: Insufficient documentation

## 2012-08-08 DIAGNOSIS — G2581 Restless legs syndrome: Secondary | ICD-10-CM | POA: Insufficient documentation

## 2012-09-23 ENCOUNTER — Non-Acute Institutional Stay (SKILLED_NURSING_FACILITY): Payer: Medicare Other | Admitting: Adult Health

## 2012-09-23 DIAGNOSIS — G2581 Restless legs syndrome: Secondary | ICD-10-CM

## 2012-09-23 DIAGNOSIS — R1310 Dysphagia, unspecified: Secondary | ICD-10-CM

## 2012-09-23 DIAGNOSIS — K219 Gastro-esophageal reflux disease without esophagitis: Secondary | ICD-10-CM

## 2012-09-23 DIAGNOSIS — E119 Type 2 diabetes mellitus without complications: Secondary | ICD-10-CM

## 2012-09-23 DIAGNOSIS — F329 Major depressive disorder, single episode, unspecified: Secondary | ICD-10-CM

## 2012-09-23 DIAGNOSIS — I2510 Atherosclerotic heart disease of native coronary artery without angina pectoris: Secondary | ICD-10-CM

## 2012-09-23 DIAGNOSIS — K5900 Constipation, unspecified: Secondary | ICD-10-CM

## 2012-09-23 DIAGNOSIS — I10 Essential (primary) hypertension: Secondary | ICD-10-CM

## 2012-09-23 DIAGNOSIS — F3289 Other specified depressive episodes: Secondary | ICD-10-CM

## 2012-09-23 DIAGNOSIS — J9600 Acute respiratory failure, unspecified whether with hypoxia or hypercapnia: Secondary | ICD-10-CM

## 2012-09-23 DIAGNOSIS — I739 Peripheral vascular disease, unspecified: Secondary | ICD-10-CM

## 2012-09-23 DIAGNOSIS — J9690 Respiratory failure, unspecified, unspecified whether with hypoxia or hypercapnia: Secondary | ICD-10-CM

## 2012-09-23 DIAGNOSIS — R609 Edema, unspecified: Secondary | ICD-10-CM

## 2012-10-01 ENCOUNTER — Other Ambulatory Visit: Payer: Self-pay | Admitting: *Deleted

## 2012-10-01 MED ORDER — OXYCODONE-ACETAMINOPHEN 5-325 MG PO TABS: ORAL_TABLET | ORAL | Status: DC

## 2012-10-21 ENCOUNTER — Non-Acute Institutional Stay (SKILLED_NURSING_FACILITY): Payer: Medicare Other | Admitting: Adult Health

## 2012-10-21 DIAGNOSIS — J9690 Respiratory failure, unspecified, unspecified whether with hypoxia or hypercapnia: Secondary | ICD-10-CM

## 2012-10-21 DIAGNOSIS — K219 Gastro-esophageal reflux disease without esophagitis: Secondary | ICD-10-CM

## 2012-10-21 DIAGNOSIS — I739 Peripheral vascular disease, unspecified: Secondary | ICD-10-CM

## 2012-10-21 DIAGNOSIS — R1310 Dysphagia, unspecified: Secondary | ICD-10-CM

## 2012-10-21 DIAGNOSIS — I10 Essential (primary) hypertension: Secondary | ICD-10-CM

## 2012-10-21 DIAGNOSIS — F32A Depression, unspecified: Secondary | ICD-10-CM

## 2012-10-21 DIAGNOSIS — R609 Edema, unspecified: Secondary | ICD-10-CM

## 2012-10-21 DIAGNOSIS — F329 Major depressive disorder, single episode, unspecified: Secondary | ICD-10-CM

## 2012-10-21 DIAGNOSIS — J9600 Acute respiratory failure, unspecified whether with hypoxia or hypercapnia: Secondary | ICD-10-CM

## 2012-10-21 DIAGNOSIS — K5900 Constipation, unspecified: Secondary | ICD-10-CM

## 2012-10-21 DIAGNOSIS — G2581 Restless legs syndrome: Secondary | ICD-10-CM

## 2012-10-21 DIAGNOSIS — E119 Type 2 diabetes mellitus without complications: Secondary | ICD-10-CM

## 2012-10-22 ENCOUNTER — Encounter: Payer: Self-pay | Admitting: Adult Health

## 2012-10-22 DIAGNOSIS — R609 Edema, unspecified: Secondary | ICD-10-CM | POA: Insufficient documentation

## 2012-10-22 DIAGNOSIS — J9690 Respiratory failure, unspecified, unspecified whether with hypoxia or hypercapnia: Secondary | ICD-10-CM | POA: Insufficient documentation

## 2012-10-22 DIAGNOSIS — K5900 Constipation, unspecified: Secondary | ICD-10-CM | POA: Insufficient documentation

## 2012-10-22 MED ORDER — IPRATROPIUM-ALBUTEROL 0.5-2.5 (3) MG/3ML IN SOLN: 3.0000 mL | Freq: Four times a day (QID) | RESPIRATORY_TRACT | Status: DC | PRN

## 2012-10-22 NOTE — Assessment & Plan Note (Signed)
Her status is stable will continue her levemir 15 units daily and stop the novolog at this time and will monitor her status

## 2012-10-22 NOTE — Assessment & Plan Note (Signed)
Will continue neurontin 300 mg three times daily

## 2012-10-22 NOTE — Progress Notes (Signed)
Patient ID: Shelia Webb, female   DOB: 02-Oct-1921, 79 y.o.   MRN: [id number] [facility]  Allergies  Allergen Reactions  . Penicillins Other (See Comments)    Redness and skin irritation     Chief Complaint  Patient presents with  . Medical Managment of Chronic Issues    HPI: She is being seen for the management of chronic illnesses. There are no concerns being voiced by the nursing staff. She is unable to participate in the hpi or ros. She has recently experienced the death of her son. She has been declining over the past several months.   Past Medical History  Diagnosis Date  . Hypertension   . Diabetes mellitus   . Dysrhythmia     a-fib  . Anemia   . Acute respiratory failure   . Chronic kidney disease     Kidney failure  . Allergic rhinitis   . Osteoarthrosis   . Osteoporosis   . Anxiety   . Depression   . Restless leg syndrome   . GERD (gastroesophageal reflux disease)   . Hypertonic bladder   . Peripheral vascular disease   . Coronary atherosclerosis due to lipid rich plaque   . DNR (do not resuscitate)     Past Surgical History  Procedure Laterality Date  . Colon surgery      has colostomy  . Abdominal hysterectomy    . Appendectomy    . Tonsillectomy    . Cardiac catheterization      bilateral cataract surgery  . Diaphragmatic hernia repair      with gangrene  . Orif patella  12/07/2010    Procedure: OPEN REDUCTION INTERNAL (ORIF) FIXATION PATELLA;  Surgeon: Nadara Mustard, MD;  Location: [facility] OR;  Service: Orthopedics;  Laterality: Left;  Left Knee Hamstring Release and Open Reduction Internal Fixation Patella  . Orif patella  12/21/2010    Procedure: OPEN REDUCTION INTERNAL (ORIF) FIXATION PATELLA;  Surgeon: Nadara Mustard, MD;  Location: [facility] OR;  Service: Orthopedics;  Laterality: Left;  Left Knee Hamstring Release and Open Reduction Internal Fixation Patella    VITAL SIGNS BP 130/75  Pulse 72  Ht 5' (1.524 m)  Wt 148 lb (67.132 kg)  BMI 28.9  kg/m2   Patient's Medications  New Prescriptions   No medications on file  Previous Medications   ALBUTEROL (PROVENTIL HFA;VENTOLIN HFA) 108 (90 BASE) MCG/ACT INHALER    Inhale 2 puffs into the lungs every 6 (six) hours as needed for wheezing.   ARTIFICIAL TEAR OINTMENT (LACRI-LUBE OP)    Place 1 drop into both eyes at bedtime.    ASPIRIN EC 81 MG TABLET    Take 81 mg by mouth daily.   BACLOFEN (LIORESAL) 5 MG TABS    Take 5 mg by mouth 3 (three) times daily as needed (neck spasms).   CALCIUM-VITAMIN D (OSCAL WITH D) 500-200 MG-UNIT PER TABLET    Take 1 tablet by mouth daily.     CYCLOSPORINE (RESTASIS) 0.05 % OPHTHALMIC EMULSION    Place 1 drop into both eyes 2 (two) times daily.     DICLOFENAC SODIUM (VOLTAREN) 1 % GEL    Apply 1 application topically 4 (four) times daily as needed (knee pain). For both knees   FLUTICASONE-SALMETEROL (ADVAIR) 100-50 MCG/DOSE AEPB    Inhale 1 puff into the lungs daily.    FOOD THICKENER (THICK IT) POWD    Take 1 Container by mouth as needed (for honey thick liquids).  FUROSEMIDE (LASIX) 20 MG TABLET    Take 20 mg by mouth daily.     GABAPENTIN (NEURONTIN) 300 MG CAPSULE    Take 300 mg by mouth 3 (three) times daily.    INSULIN ASPART (NOVOLOG) 100 UNIT/ML INJECTION    Inject 8 Units into the skin 3 (three) times daily before meals. For cbg > 200 Do Not give at bedtime   INSULIN DETEMIR (LEVEMIR) 100 UNIT/ML INJECTION    Inject 15 Units into the skin at bedtime.    LORAZEPAM (ATIVAN) 0.5 MG TABLET    Take 0.5 mg by mouth every 6 (six) hours as needed for anxiety.   METOPROLOL SUCCINATE (TOPROL-XL) 25 MG 24 HR TABLET    Take 25 mg by mouth daily.     MULTIPLE VITAMINS-MINERALS (MULTIVITAMINS THER. W/MINERALS) TABS    Take 1 tablet by mouth daily.     OMEPRAZOLE (PRILOSEC OTC) 20 MG TABLET    Take 20 mg by mouth daily.   OXYCODONE-ACETAMINOPHEN (PERCOCET/ROXICET) 5-325 MG PER TABLET    Take one tablet by mouth every 6 hours as needed For pain   ROPINIROLE  (REQUIP) 0.25 MG TABLET    Take 0.25 mg by mouth 3 (three) times daily.    SENNA (SENOKOT) 8.6 MG TABS    Take 1 tablet by mouth at bedtime.     TRAZODONE (DESYREL) 50 MG TABLET    Take 1 tablet (50 mg total) by mouth at bedtime as needed for sleep. For insomnia   VENLAFAXINE (EFFEXOR) 37.5 MG TABLET    Take 37.5 mg by mouth 2 (two) times daily.   VITAMIN D, ERGOCALCIFEROL, (DRISDOL) 50000 UNITS CAPS    Take 50,000 Units by mouth every 7 (seven) days. mondays  Modified Medications   No medications on file  Discontinued Medications   DULOXETINE (CYMBALTA) 60 MG CAPSULE    Take 60 mg by mouth daily.     LORAZEPAM (ATIVAN) 0.5 MG TABLET    Take 0.5 mg by mouth every 6 (six) hours as needed for anxiety.   PROMETHAZINE (PHENERGAN) 25 MG SUPPOSITORY    Place 25 mg rectally every 6 (six) hours as needed for nausea.    SIGNIFICANT DIAGNOSTIC EXAMS   LABS REVIEWED  04-15-12: wbc 5.2; hgb 14.5; hct 43.7; mcv 88.1; plt 144; glucose 86; bun 16; creat .98; k+ 3.8; na++139 04-22-12: mag 2.0 05-04-12: hgb a1c 7.0 06-20-12: wbc 9.2; hgb 15.4; hct 44.6 ;mcv 87.5; plt 185 06-22-12: glucose 126; bun 13; creat 0.54; k+3.6; na++126  07-01-12: tsh 1.053; vit b12: 376; folate 14.9 08-24-12: hgb a1c 6.3   Review of Systems  Unable to perform ROS   Physical Exam  Constitutional:  thin  Neck: Neck supple. No JVD present.  Cardiovascular: Normal rate, regular rhythm and intact distal pulses.   Respiratory: Effort normal and breath sounds normal. No respiratory distress. She has no wheezes.  GI: Soft. Bowel sounds are normal. She exhibits no distension. There is no tenderness.  colostomy  Musculoskeletal: She exhibits no edema.  Has limited range of motion in all extremities present.   Neurological: She is alert.  Skin: Skin is warm and dry.       ASSESSMENT/ PLAN:  HYPERTENSION She is presently stable will continue toprol xl 25 mg daily   PERIPHERAL VASCULAR DISEASE Will continue neurontin 300 mg  three times daily   GERD Will continue prilosec 20 mg daily   Dysphagia No signs of aspiration will continue honey thick liquids and will monitor  RESTLESS LEG SYNDROME Is presently stable will continue requip 0.25 mg three times daily has percocet 5/325 mg every 6 hours as needed for pain.   Depression Her mood state is without change in status; will continue effexor 37.5 mg twice daily; ativan 0.5 mg every 6 hours as needed for anxiety; and trazodone 50 mg nightly for sleep and will monitor   Respiratory failure She is unable to utilize her inhalers; will stop her albuterol and advair will begin  duoneb every 6 hours as needed and will monitor her status   DM Her status is stable will continue her levemir 15 units daily and stop the novolog at this time and will monitor her status   CAD No change in status will continue asa 81 mg daily   Edema Her status is stable will continue her lasix 20 mg daily and will monitor her status   Constipation Will continue senna daily   Time spent with patient 50 minutes.

## 2012-10-22 NOTE — Assessment & Plan Note (Signed)
Her status is stable will continue her lasix 20 mg daily and will monitor her status

## 2012-10-22 NOTE — Assessment & Plan Note (Signed)
No signs of aspiration will continue honey thick liquids and will monitor

## 2012-10-22 NOTE — Assessment & Plan Note (Addendum)
Her mood state is without change in status; will continue effexor 37.5 mg twice daily; ativan 0.5 mg every 6 hours as needed for anxiety; and trazodone 50 mg nightly for sleep and will monitor

## 2012-10-22 NOTE — Assessment & Plan Note (Signed)
No change in status will continue asa 81 mg daily

## 2012-10-22 NOTE — Assessment & Plan Note (Signed)
She is presently stable will continue toprol xl 25 mg daily

## 2012-10-22 NOTE — Assessment & Plan Note (Signed)
Will continue senna daily

## 2012-10-22 NOTE — Assessment & Plan Note (Signed)
She is unable to utilize her inhalers; will stop her albuterol and advair will begin  duoneb every 6 hours as needed and will monitor her status

## 2012-10-22 NOTE — Assessment & Plan Note (Signed)
Is presently stable will continue requip 0.25 mg three times daily has percocet 5/325 mg every 6 hours as needed for pain.

## 2012-10-22 NOTE — Assessment & Plan Note (Signed)
Will continue prilosec 20 mg daily

## 2012-10-26 NOTE — Progress Notes (Signed)
Patient ID: GLORIOUS FLICKER, female   DOB: 06/01/1921, 79 y.o.   MRN: [id number]  [facility]  Allergies  Allergen Reactions  . Penicillins Other (See Comments)    Redness and skin irritation     Chief Complaint  Patient presents with  . Medical Managment of Chronic Issues    HPI: She is being seen for the managements of her chronic illnesses. She is without change in status this past month. There have been no infections present no significant weight loss present. She is unable to participate in the hpi or ros.   Past Medical History  Diagnosis Date  . Hypertension   . Diabetes mellitus   . Dysrhythmia     a-fib  . Anemia   . Acute respiratory failure   . Chronic kidney disease     Kidney failure  . Allergic rhinitis   . Osteoarthrosis   . Osteoporosis   . Anxiety   . Depression   . Restless leg syndrome   . GERD (gastroesophageal reflux disease)   . Hypertonic bladder   . Peripheral vascular disease   . Coronary atherosclerosis due to lipid rich plaque   . DNR (do not resuscitate)     Past Surgical History  Procedure Laterality Date  . Colon surgery      has colostomy  . Abdominal hysterectomy    . Appendectomy    . Tonsillectomy    . Cardiac catheterization      bilateral cataract surgery  . Diaphragmatic hernia repair      with gangrene  . Orif patella  12/07/2010    Procedure: OPEN REDUCTION INTERNAL (ORIF) FIXATION PATELLA;  Surgeon: Nadara Mustard, MD;  Location: [facility] OR;  Service: Orthopedics;  Laterality: Left;  Left Knee Hamstring Release and Open Reduction Internal Fixation Patella  . Orif patella  12/21/2010    Procedure: OPEN REDUCTION INTERNAL (ORIF) FIXATION PATELLA;  Surgeon: Nadara Mustard, MD;  Location: [facility] OR;  Service: Orthopedics;  Laterality: Left;  Left Knee Hamstring Release and Open Reduction Internal Fixation Patella    VITAL SIGNS BP 118/76  Pulse 70  Ht 5' (1.524 m)  Wt 148 lb (67.132 kg)  BMI 28.9 kg/m2   Patient's Medications   New Prescriptions   IPRATROPIUM-ALBUTEROL (DUONEB) 0.5-2.5 (3) MG/3ML SOLN    Take 3 mLs by nebulization every 6 (six) hours as needed.  Previous Medications   ARTIFICIAL TEAR OINTMENT (LACRI-LUBE OP)    Place 1 drop into both eyes at bedtime.    ASPIRIN EC 81 MG TABLET    Take 81 mg by mouth daily.   BACLOFEN (LIORESAL) 5 MG TABS    Take 5 mg by mouth 3 (three) times daily as needed (neck spasms).   CALCIUM-VITAMIN D (OSCAL WITH D) 500-200 MG-UNIT PER TABLET    Take 1 tablet by mouth daily.     CYCLOSPORINE (RESTASIS) 0.05 % OPHTHALMIC EMULSION    Place 1 drop into both eyes 2 (two) times daily.     DICLOFENAC SODIUM (VOLTAREN) 1 % GEL    Apply 1 application topically 4 (four) times daily as needed (knee pain). For both knees   FOOD THICKENER (THICK IT) POWD    Take 1 Container by mouth as needed (for honey thick liquids).   FUROSEMIDE (LASIX) 20 MG TABLET    Take 20 mg by mouth daily.     GABAPENTIN (NEURONTIN) 300 MG CAPSULE    Take 300 mg by mouth 3 (three) times daily.  INSULIN DETEMIR (LEVEMIR) 100 UNIT/ML INJECTION    Inject 15 Units into the skin at bedtime.    LORAZEPAM (ATIVAN) 0.5 MG TABLET    Take 0.5 mg by mouth every 6 (six) hours as needed for anxiety.   METOPROLOL SUCCINATE (TOPROL-XL) 25 MG 24 HR TABLET    Take 25 mg by mouth daily.     MULTIPLE VITAMINS-MINERALS (MULTIVITAMINS THER. W/MINERALS) TABS    Take 1 tablet by mouth daily.     OMEPRAZOLE (PRILOSEC OTC) 20 MG TABLET    Take 20 mg by mouth daily.   OXYCODONE-ACETAMINOPHEN (PERCOCET/ROXICET) 5-325 MG PER TABLET    Take one tablet by mouth every 6 hours as needed For pain   ROPINIROLE (REQUIP) 0.25 MG TABLET    Take 0.25 mg by mouth 3 (three) times daily.    SENNA (SENOKOT) 8.6 MG TABS    Take 1 tablet by mouth at bedtime.     TRAZODONE (DESYREL) 50 MG TABLET    Take 1 tablet (50 mg total) by mouth at bedtime as needed for sleep. For insomnia   VENLAFAXINE (EFFEXOR) 37.5 MG TABLET    Take 37.5 mg by mouth 2 (two) times  daily.   VITAMIN D, ERGOCALCIFEROL, (DRISDOL) 50000 UNITS CAPS    Take 50,000 Units by mouth every 7 (seven) days. mondays  Modified Medications   No medications on file  Discontinued Medications   ALBUTEROL (PROVENTIL HFA;VENTOLIN HFA) 108 (90 BASE) MCG/ACT INHALER    Inhale 2 puffs into the lungs every 6 (six) hours as needed for wheezing.   FLUTICASONE-SALMETEROL (ADVAIR) 100-50 MCG/DOSE AEPB    Inhale 1 puff into the lungs daily.    INSULIN ASPART (NOVOLOG) 100 UNIT/ML INJECTION    Inject 8 Units into the skin 3 (three) times daily before meals. For cbg > 200 Do Not give at bedtime   PROMETHAZINE (PHENERGAN) 25 MG SUPPOSITORY    Place 25 mg rectally every 6 (six) hours as needed for nausea.    SIGNIFICANT DIAGNOSTIC EXAMS    LABS REVIEWED  04-15-12: wbc 5.2; hgb 14.5; hct 43.7; mcv 88.1; plt 144; glucose 86; bun 16; creat .98; k+ 3.8; na++139 04-22-12: mag 2.0 05-04-12: hgb a1c 7.0 06-20-12: wbc 9.2; hgb 15.4; hct 44.6 ;mcv 87.5; plt 185 06-22-12: glucose 126; bun 13; creat 0.54; k+3.6; na++126  07-01-12: tsh 1.053; vit b12: 376; folate 14.9 08-24-12: hgb a1c 6.3   Review of Systems  Unable to perform ROS   Physical Exam  Constitutional:  thin  Neck: Neck supple. No JVD present.  Cardiovascular: Normal rate, regular rhythm and intact distal pulses.   Respiratory: Effort normal and breath sounds normal. No respiratory distress. She has no wheezes.  GI: Soft. Bowel sounds are normal. She exhibits no distension. There is no tenderness.  colostomy  Musculoskeletal: She exhibits no edema.  Has limited range of motion in all extremities present.   Neurological: She is alert.  Skin: Skin is warm and dry.       ASSESSMENT/ PLAN:  HYPERTENSION She is presently stable will continue toprol xl 25 mg daily   PERIPHERAL VASCULAR DISEASE Will continue neurontin 300 mg three times daily   GERD Will continue prilosec 20 mg daily   Dysphagia No signs of aspiration will continue  honey thick liquids and will monitor   RESTLESS LEG SYNDROME Is presently stable will continue requip 0.25 mg three times daily has percocet 5/325 mg every 6 hours as needed for pain. Will continue her neurontin 300  mg daily   Depression Her mood state is without change in status; will continue effexor 37.5 mg twice daily; ativan 0.5 mg every 6 hours as needed for anxiety; and trazodone 50 mg nightly for sleep and will monitor   Respiratory failure She is  Without change in status will continue her duoneb every 6 hours as needed and will monitor her status   DM Her status is stable will continue her levemir 15 units daily    CAD No change in status will continue asa 81 mg daily   Edema Her status is stable will continue her lasix 20 mg daily and will monitor her status   Constipation Will continue senna daily  Will check vit d level

## 2012-11-05 ENCOUNTER — Non-Acute Institutional Stay (SKILLED_NURSING_FACILITY): Payer: Medicare Other | Admitting: Internal Medicine

## 2012-11-05 DIAGNOSIS — M8580 Other specified disorders of bone density and structure, unspecified site: Secondary | ICD-10-CM | POA: Insufficient documentation

## 2012-11-05 DIAGNOSIS — S62109A Fracture of unspecified carpal bone, unspecified wrist, initial encounter for closed fracture: Secondary | ICD-10-CM

## 2012-11-05 DIAGNOSIS — M899 Disorder of bone, unspecified: Secondary | ICD-10-CM

## 2012-11-05 DIAGNOSIS — S62101A Fracture of unspecified carpal bone, right wrist, initial encounter for closed fracture: Secondary | ICD-10-CM

## 2012-11-05 NOTE — Progress Notes (Signed)
Patient ID: Shelia Webb, female   DOB: December 18, 1921, 79 y.o.   MRN: [id number]  [facility] [employer]  Chief Complaint  Patient presents with  . Acute Visit    right wrist pain   Allergies  Allergen Reactions  . Penicillins Other (See Comments)    Redness and skin irritation   Code status- dnr  HPI 79 y/o female patient is a long term care resident of the facility. She was seen today with complaints of pain in her right wrist and hand for 2 days. She does not recall any fall or trauma. As per staff, no witness fall or trauma. She denies pain elsewhere.  ROS No known hx of gout No fever or chills No chest pain No shortness of breath No nausea or vomiting No abdominal pain  Past Medical History  Diagnosis Date  . Hypertension   . Diabetes mellitus   . Dysrhythmia     a-fib  . Anemia   . Acute respiratory failure   . Chronic kidney disease     Kidney failure  . Allergic rhinitis   . Osteoarthrosis   . Osteoporosis   . Anxiety   . Depression   . Restless leg syndrome   . GERD (gastroesophageal reflux disease)   . Hypertonic bladder   . Peripheral vascular disease   . Coronary atherosclerosis due to lipid rich plaque   . DNR (do not resuscitate)    Current Outpatient Prescriptions on File Prior to Visit  Medication Sig Dispense Refill  . Artificial Tear Ointment (LACRI-LUBE OP) Place 1 drop into both eyes at bedtime.       Marland Kitchen aspirin EC 81 MG tablet Take 81 mg by mouth daily.      . baclofen (LIORESAL) 5 mg TABS Take 5 mg by mouth 3 (three) times daily as needed (neck spasms).      . calcium-vitamin D (OSCAL WITH D) 500-200 MG-UNIT per tablet Take 1 tablet by mouth daily.        . cycloSPORINE (RESTASIS) 0.05 % ophthalmic emulsion Place 1 drop into both eyes 2 (two) times daily.        . diclofenac sodium (VOLTAREN) 1 % GEL Apply 1 application topically 4 (four) times daily as needed (knee pain). For both knees      . food thickener (THICK IT) POWD Take 1 Container  by mouth as needed (for honey thick liquids).      . furosemide (LASIX) 20 MG tablet Take 20 mg by mouth daily.        Marland Kitchen gabapentin (NEURONTIN) 300 MG capsule Take 300 mg by mouth 3 (three) times daily.       . insulin detemir (LEVEMIR) 100 UNIT/ML injection Inject 15 Units into the skin at bedtime.       Marland Kitchen ipratropium-albuterol (DUONEB) 0.5-2.5 (3) MG/3ML SOLN Take 3 mLs by nebulization every 6 (six) hours as needed.  360 mL  11  . LORazepam (ATIVAN) 0.5 MG tablet Take 0.5 mg by mouth every 6 (six) hours as needed for anxiety.      . metoprolol succinate (TOPROL-XL) 25 MG 24 hr tablet Take 25 mg by mouth daily.        . Multiple Vitamins-Minerals (MULTIVITAMINS THER. W/MINERALS) TABS Take 1 tablet by mouth daily.        Marland Kitchen omeprazole (PRILOSEC OTC) 20 MG tablet Take 20 mg by mouth daily.      Marland Kitchen oxyCODONE-acetaminophen (PERCOCET/ROXICET) 5-325 MG per tablet Take one tablet by mouth  every 6 hours as needed For pain  120 tablet  0  . rOPINIRole (REQUIP) 0.25 MG tablet Take 0.25 mg by mouth 3 (three) times daily.       Marland Kitchen senna (SENOKOT) 8.6 MG TABS Take 1 tablet by mouth at bedtime.        . traZODone (DESYREL) 50 MG tablet Take 1 tablet (50 mg total) by mouth at bedtime as needed for sleep. For insomnia      . venlafaxine (EFFEXOR) 37.5 MG tablet Take 37.5 mg by mouth 2 (two) times daily.      . Vitamin D, Ergocalciferol, (DRISDOL) 50000 UNITS CAPS Take 50,000 Units by mouth every 7 (seven) days. mondays       No current facility-administered medications on file prior to visit.    BP 132/64  Pulse 60  Temp(Src) 98.4 F (36.9 C)  Resp 16  Ht 5' (1.524 m)  Wt 151 lb (68.493 kg)  BMI 29.49 kg/m2  SpO2 96%  gen- elderly female in NAD HEENT no pallor or icterus, no LAD, MMM CVS- normal s1,s2, rrr respi-CTAB abdo- bs+, soft, non tender, ostomy bag in place Ext- has swelling with bruise on right wrist on medial side. Tenderness with flexion at the wrist and on palpation on latera side. Ale to  make a fist without discomfort.   Imaging- Stat wrist AP and lateral xray ordered- possible wrist fracture with bony density protruding posteriorly from the wrist, s/o triquetral fracture. There are moderate DJD changes and osteopenia. Soft tissue swelling present  Assessment/plan  Right wrist fracture- new onset and xray s/o fracture. Likely traumatic give the bruise on her right hand. Will change her oxycodone-apap to 5-325 mg 1 tab every 12 hour for pain and continue q6h prn. Will have her right arm on rest with a wrist splint for now until seen by orthopedic. Will provide orthopedic referral to assess for evaluation and treatment of fracture further.Continue oscal for now.  Osteopenia- continue oscal for now

## 2012-11-10 ENCOUNTER — Non-Acute Institutional Stay (SKILLED_NURSING_FACILITY): Payer: Medicare Other | Admitting: Internal Medicine

## 2012-11-10 DIAGNOSIS — E119 Type 2 diabetes mellitus without complications: Secondary | ICD-10-CM

## 2012-11-10 DIAGNOSIS — IMO0002 Reserved for concepts with insufficient information to code with codable children: Secondary | ICD-10-CM

## 2012-11-10 DIAGNOSIS — M25562 Pain in left knee: Secondary | ICD-10-CM

## 2012-11-10 DIAGNOSIS — S62101A Fracture of unspecified carpal bone, right wrist, initial encounter for closed fracture: Secondary | ICD-10-CM

## 2012-11-10 DIAGNOSIS — S62109A Fracture of unspecified carpal bone, unspecified wrist, initial encounter for closed fracture: Secondary | ICD-10-CM

## 2012-11-10 DIAGNOSIS — M25569 Pain in unspecified knee: Secondary | ICD-10-CM

## 2012-11-10 DIAGNOSIS — M792 Neuralgia and neuritis, unspecified: Secondary | ICD-10-CM

## 2012-11-10 DIAGNOSIS — M810 Age-related osteoporosis without current pathological fracture: Secondary | ICD-10-CM

## 2012-11-10 NOTE — Progress Notes (Signed)
Patient ID: Shelia Webb, female   DOB: 04-28-1921, 79 y.o.   MRN: [id number] Location:  [facility] SN[facility] Provider:  Elmarie Shiley L. Renato Gails, D.O., C.M.D.  Code Status:  DNR  Chief Complaint  Patient presents with  . Acute Visit    left leg pain, h/o prior patellar fx left, foot caught during transfer 1 month ago, mild edema of bilateral feet    HPI:  79 yo female long term care resident with h/o senile osteoporosis, osteoarthritis, prior stroke, macular degeneration was seen due to left leg pain.  She has a prior history of left patellar fx and some chronic pain related to this per nursing staff.  She got her foot caught during a transfer about a month ago.  She also has mild edema of her bilateral lower extremities (dependent nonpitting).  Her daughter is requesting an xray be performed of her lower leg.    She c/o pain during palpation of the left knee and lower leg.    Review of Systems:  Review of Systems  Constitutional: Negative for malaise/fatigue.  HENT: Negative for congestion.   Eyes: Negative for blurred vision.  Respiratory: Negative for shortness of breath.   Cardiovascular: Negative for chest pain.  Gastrointestinal: Negative for abdominal pain.  Genitourinary: Negative for dysuria.  Musculoskeletal: Positive for joint pain and myalgias. Negative for falls.  Skin: Negative for rash.  Neurological: Positive for sensory change.  Endo/Heme/Allergies: Bruises/bleeds easily.  Psychiatric/Behavioral: Positive for memory loss.    Medications: Patient's Medications  New Prescriptions   No medications on file  Previous Medications   ARTIFICIAL TEAR OINTMENT (LACRI-LUBE OP)    Place 1 drop into both eyes at bedtime.    ASPIRIN EC 81 MG TABLET    Take 81 mg by mouth daily.   BACLOFEN (LIORESAL) 5 MG TABS    Take 5 mg by mouth 3 (three) times daily as needed (neck spasms).   CALCIUM-VITAMIN D (OSCAL WITH D) 500-200 MG-UNIT PER TABLET    Take 1 tablet by mouth daily.      CYCLOSPORINE (RESTASIS) 0.05 % OPHTHALMIC EMULSION    Place 1 drop into both eyes 2 (two) times daily.     DICLOFENAC SODIUM (VOLTAREN) 1 % GEL    Apply 1 application topically 4 (four) times daily as needed (knee pain). For both knees   FOOD THICKENER (THICK IT) POWD    Take 1 Container by mouth as needed (for honey thick liquids).   FUROSEMIDE (LASIX) 20 MG TABLET    Take 20 mg by mouth daily.     GABAPENTIN (NEURONTIN) 300 MG CAPSULE    Take 300 mg by mouth 3 (three) times daily.    INSULIN DETEMIR (LEVEMIR) 100 UNIT/ML INJECTION    Inject 15 Units into the skin at bedtime.    IPRATROPIUM-ALBUTEROL (DUONEB) 0.5-2.5 (3) MG/3ML SOLN    Take 3 mLs by nebulization every 6 (six) hours as needed.   LORAZEPAM (ATIVAN) 0.5 MG TABLET    Take 0.5 mg by mouth every 6 (six) hours as needed for anxiety.   METOPROLOL SUCCINATE (TOPROL-XL) 25 MG 24 HR TABLET    Take 25 mg by mouth daily.     MULTIPLE VITAMINS-MINERALS (MULTIVITAMINS THER. W/MINERALS) TABS    Take 1 tablet by mouth daily.     OMEPRAZOLE (PRILOSEC OTC) 20 MG TABLET    Take 20 mg by mouth daily.   OXYCODONE-ACETAMINOPHEN (PERCOCET/ROXICET) 5-325 MG PER TABLET    Take one tablet by mouth every  6 hours as needed For pain   ROPINIROLE (REQUIP) 0.25 MG TABLET    Take 0.25 mg by mouth 3 (three) times daily.    SENNA (SENOKOT) 8.6 MG TABS    Take 1 tablet by mouth at bedtime.     TRAZODONE (DESYREL) 50 MG TABLET    Take 1 tablet (50 mg total) by mouth at bedtime as needed for sleep. For insomnia   VENLAFAXINE (EFFEXOR) 37.5 MG TABLET    Take 37.5 mg by mouth 2 (two) times daily.   VITAMIN D, ERGOCALCIFEROL, (DRISDOL) 50000 UNITS CAPS    Take 50,000 Units by mouth every 7 (seven) days. mondays  Modified Medications   No medications on file  Discontinued Medications   No medications on file    Physical Exam: There were no vitals filed for this visit. Physical Exam  Constitutional: No distress.  HENT:  Head: Normocephalic and atraumatic.   Cardiovascular: Normal rate, regular rhythm, normal heart sounds and intact distal pulses.   Pulmonary/Chest: Effort normal and breath sounds normal. No respiratory distress.  Abdominal: Soft. Bowel sounds are normal. She exhibits no distension. There is no tenderness.  Musculoskeletal: She exhibits edema and tenderness.  Only minimal tenderness of right wrist, tender left lower leg and knee  Neurological: She is alert.  Skin: Skin is warm and dry.     Labs reviewed: Basic Metabolic Panel:  Recent Labs  16/10/96 0410 06/21/12 0400 06/22/12 0510  NA 147* 143 139  K 3.7 3.1* 3.6  CL 110 107 106  CO2 25 24 22   GLUCOSE 108* 89 126*  BUN 12 11 13   CREATININE 0.62 0.56 0.54  CALCIUM 9.1 8.9 8.7    Liver Function Tests:  Recent Labs  06/17/12 2221 06/18/12 0735  AST 21 15  ALT 17 13  ALKPHOS 106 85  BILITOT 0.4 0.5  PROT 8.4* 6.7  ALBUMIN 4.0 3.1*    CBC:  Recent Labs  06/17/12 2221 06/18/12 0217 06/18/12 0745 06/20/12 0410  WBC 16.6* 11.9* 10.9* 9.2  NEUTROABS 13.6*  --  7.8*  --   HGB 18.0* 16.5* 15.5* 15.4*  HCT 52.0* 48.0* 45.9 44.6  MCV 88.3 88.6 89.5 87.5  PLT 182 160 166 185   hba1c 08/24/12:  6.3  Significant Diagnostic Results:  11/05/12 xray left wrist:  Possible fracture vs. Degenerative changes  Consult from Dr. [facility]:  Osteoarthritis of right wrist and fingers, continue velcro splint--no sign of gout or infection; no indication for surgery, f/u prn.  Assessment/Plan 1. Pain in joint, lower leg, left -obtain xray left knee and lower leg though I suspect she has posttraumatic and degenerative changes, apply heat for pain  2. Right wrist fracture, closed, initial encounter -appears this is arthritis not a fracture--need report of xray from orthopedics office--nursing staff to call to obtain report  3. Neuropathic pain -continue current meds  4. Senile osteoporosis -change to vitamin D 2000 units daily  5.   DMII -check hba1c , bmp in 2 wks  Family/ staff Communication: discussed with pt, unit supervisor  Labs/tests ordered:  Xray left knee and leg, hba1c, bmp in 2 wks

## 2012-11-15 ENCOUNTER — Encounter: Payer: Self-pay | Admitting: Internal Medicine

## 2012-11-20 ENCOUNTER — Encounter: Payer: Self-pay | Admitting: Cardiology

## 2012-12-19 ENCOUNTER — Encounter: Payer: Self-pay | Admitting: Internal Medicine

## 2012-12-19 DIAGNOSIS — M792 Neuralgia and neuritis, unspecified: Secondary | ICD-10-CM | POA: Insufficient documentation

## 2012-12-19 DIAGNOSIS — E1149 Type 2 diabetes mellitus with other diabetic neurological complication: Secondary | ICD-10-CM | POA: Insufficient documentation

## 2012-12-19 DIAGNOSIS — Z8673 Personal history of transient ischemic attack (TIA), and cerebral infarction without residual deficits: Secondary | ICD-10-CM | POA: Insufficient documentation

## 2012-12-19 DIAGNOSIS — I672 Cerebral atherosclerosis: Secondary | ICD-10-CM | POA: Insufficient documentation

## 2013-01-08 ENCOUNTER — Non-Acute Institutional Stay (SKILLED_NURSING_FACILITY): Payer: Medicare Other | Admitting: Internal Medicine

## 2013-01-08 ENCOUNTER — Encounter: Payer: Self-pay | Admitting: Internal Medicine

## 2013-01-08 DIAGNOSIS — F3289 Other specified depressive episodes: Secondary | ICD-10-CM

## 2013-01-08 DIAGNOSIS — I10 Essential (primary) hypertension: Secondary | ICD-10-CM

## 2013-01-08 DIAGNOSIS — M1990 Unspecified osteoarthritis, unspecified site: Secondary | ICD-10-CM

## 2013-01-08 DIAGNOSIS — K219 Gastro-esophageal reflux disease without esophagitis: Secondary | ICD-10-CM

## 2013-01-08 DIAGNOSIS — E1149 Type 2 diabetes mellitus with other diabetic neurological complication: Secondary | ICD-10-CM

## 2013-01-08 DIAGNOSIS — E559 Vitamin D deficiency, unspecified: Secondary | ICD-10-CM

## 2013-01-08 DIAGNOSIS — F32A Depression, unspecified: Secondary | ICD-10-CM

## 2013-01-08 DIAGNOSIS — E1142 Type 2 diabetes mellitus with diabetic polyneuropathy: Secondary | ICD-10-CM

## 2013-01-08 DIAGNOSIS — E1140 Type 2 diabetes mellitus with diabetic neuropathy, unspecified: Secondary | ICD-10-CM

## 2013-01-08 DIAGNOSIS — F329 Major depressive disorder, single episode, unspecified: Secondary | ICD-10-CM

## 2013-01-08 NOTE — Progress Notes (Signed)
Patient ID: Shelia Webb, female   DOB: 1921-09-02, 79 y.o.   MRN: [id number]    Shelia Webb living [employer]  Chief Complaint  Patient presents with  . Medical Managment of Chronic Issues   Allergies  Allergen Reactions  . Penicillins Other (See Comments)    Redness and skin irritation   HPI 79 yo female long term care resident with h/o senile osteoporosis, osteoarthritis, prior stroke, macular degeneration and is seen today for routine visit. No new concern from patient or staff this visit. She has hearing loss  Review of Systems  Constitutional: Negative for malaise/fatigue.  HENT: Negative for congestion.   Eyes: Negative for blurred vision.  Respiratory: Negative for shortness of breath.   Cardiovascular: Negative for chest pain.  Gastrointestinal: Negative for abdominal pain.  Genitourinary: Negative for dysuria.  Musculoskeletal: Positive for joint pain and myalgias. Negative for falls.  Skin: Negative for rash.  Neurological: Positive for sensory change.   Psychiatric/Behavioral: Positive for memory loss.  Past Medical History  Diagnosis Date  . Hypertension   . Diabetes mellitus   . Dysrhythmia     a-fib  . Anemia   . Acute respiratory failure   . Chronic kidney disease     Kidney failure  . Allergic rhinitis   . Osteoarthrosis   . Osteoporosis   . Anxiety   . Depression   . Restless leg syndrome   . GERD (gastroesophageal reflux disease)   . Hypertonic bladder   . Peripheral vascular disease   . Coronary atherosclerosis due to lipid rich plaque   . DNR (do not resuscitate)    Past Surgical History  Procedure Laterality Date  . Colon surgery      has colostomy  . Abdominal hysterectomy    . Appendectomy    . Tonsillectomy    . Cardiac catheterization      bilateral cataract surgery  . Diaphragmatic hernia repair      with gangrene  . Orif patella  12/07/2010    Procedure: OPEN REDUCTION INTERNAL (ORIF) FIXATION PATELLA;  Surgeon: Nadara Mustard, MD;   Location: [facility] OR;  Service: Orthopedics;  Laterality: Left;  Left Knee Hamstring Release and Open Reduction Internal Fixation Patella  . Orif patella  12/21/2010    Procedure: OPEN REDUCTION INTERNAL (ORIF) FIXATION PATELLA;  Surgeon: Nadara Mustard, MD;  Location: [facility] OR;  Service: Orthopedics;  Laterality: Left;  Left Knee Hamstring Release and Open Reduction Internal Fixation Patella   Medication reviewed. See [facility]  Physical exam BP 112/66  Pulse 70  Temp(Src) 97.5 F (36.4 C)  Resp 18  SpO2 96%   Constitutional: No distress.  HENT:   Head: Normocephalic and atraumatic.  Cardiovascular: Normal rate, regular rhythm, normal heart sounds and intact distal pulses.   Pulmonary/Chest: Effort normal and breath sounds normal. No respiratory distress.  Abdominal: Soft. Bowel sounds are normal. She exhibits no distension. There is no tenderness.  Musculoskeletal: She exhibits edema - non pitting Neurological: She is alert.  Skin: Skin is warm and dry.   Labs- 04-15-12: wbc 5.2; hgb 14.5; hct 43.7; mcv 88.1; plt 144; glucose 86; bun 16; creat .98; k+ 3.8; na++139 04-22-12: mag 2.0 05-04-12: hgb a1c 7.0 06-20-12: wbc 9.2; hgb 15.4; hct 44.6 ;mcv 87.5; plt 185 06-22-12: glucose 126; bun 13; creat 0.54; k+3.6; na++126   07-01-12: tsh 1.053; vit b12: 376; folate 14.9 08-24-12: hgb a1c 6.3  10-23-12 30-89 11-23-12: na 140, k 4.3, cl 104, co2 27, glu 78, bun  18, cr 0.82, ca 9, a1c 6.6  ASSESSMENT/ PLAN:  arthritis Continue diclofenac sodium and to use splint in right wrist  HYPERTENSION She is presently stable on toprol xl 25 mg daily and aspirin  Vitamin d def Continue vitamin d 2000 u daily  Edema continue her lasix 20 mg daily and will monitor her status   Dm type 2 Reviewed recent a1c. cbg 160-190. On levemir 15 u bid at present. With a1c < 7 in this elderly patient, will decrease levemir to 10 u once a day for now and reassess.  Monitor cbg  Depression continue effexor 75 mg twice daily  and ativan 0.5 mg every 6 hours as needed for anxiety and trazodone 50 mg daily, monitor her symptoms  Peripheral neuropathy Will continue neurontin 300 mg three times daily   GERD Will continue prilosec 20 mg daily

## 2013-02-16 ENCOUNTER — Non-Acute Institutional Stay (SKILLED_NURSING_FACILITY): Payer: Medicare Other | Admitting: Internal Medicine

## 2013-02-16 DIAGNOSIS — F32A Depression, unspecified: Secondary | ICD-10-CM

## 2013-02-16 DIAGNOSIS — F329 Major depressive disorder, single episode, unspecified: Secondary | ICD-10-CM

## 2013-02-16 DIAGNOSIS — M810 Age-related osteoporosis without current pathological fracture: Secondary | ICD-10-CM

## 2013-02-16 DIAGNOSIS — E0843 Diabetes mellitus due to underlying condition with diabetic autonomic (poly)neuropathy: Secondary | ICD-10-CM

## 2013-02-16 DIAGNOSIS — E1349 Other specified diabetes mellitus with other diabetic neurological complication: Secondary | ICD-10-CM

## 2013-02-16 DIAGNOSIS — E1149 Type 2 diabetes mellitus with other diabetic neurological complication: Secondary | ICD-10-CM

## 2013-02-16 DIAGNOSIS — I10 Essential (primary) hypertension: Secondary | ICD-10-CM

## 2013-02-16 DIAGNOSIS — M1990 Unspecified osteoarthritis, unspecified site: Secondary | ICD-10-CM

## 2013-02-16 DIAGNOSIS — F3289 Other specified depressive episodes: Secondary | ICD-10-CM

## 2013-02-16 DIAGNOSIS — G909 Disorder of the autonomic nervous system, unspecified: Secondary | ICD-10-CM

## 2013-02-16 NOTE — Progress Notes (Signed)
Patient ID: Shelia Webb, female   DOB: [birth year], 80 y.o.   MRN: [id number]  Location: [facility] SN[facility] Provider:  Jonelle Sidle L. Mariea Clonts, D.O., C.M.D.  Code Status:  DNR  Chief Complaint  Patient presents with  . Medical Management of Chronic Issues    HPI:  80 yo white female long term care resident with h/o stroke, dry eyes, DMII, osteoporosis, restless legs syndrome, hypertonic bladder, PVD, CAD was seen for medical mgt of chronic diseases.  Review of Systems:  Review of Systems  Constitutional: Negative for fever.  HENT: Negative for congestion.   Eyes: Positive for blurred vision and pain.  Respiratory: Negative for shortness of breath.   Cardiovascular: Negative for chest pain.  Gastrointestinal: Negative for abdominal pain.  Genitourinary: Negative for dysuria.  Musculoskeletal: Positive for joint pain and myalgias.  Skin: Negative for rash.  Neurological: Positive for sensory change, speech change and focal weakness.       Due to prior stroke  Psychiatric/Behavioral: Positive for depression and memory loss.    Medications: Patient's Medications  New Prescriptions   No medications on file  Previous Medications   ARTIFICIAL TEAR OINTMENT (LACRI-LUBE OP)    Place 1 drop into both eyes at bedtime.    ASPIRIN EC 81 MG TABLET    Take 81 mg by mouth daily.   CALCIUM-VITAMIN D (OSCAL WITH D) 500-200 MG-UNIT PER TABLET    Take 1 tablet by mouth daily.     CHOLECALCIFEROL (VITAMIN D) 1000 UNITS TABLET    Take 2,000 Units by mouth daily.   CYCLOSPORINE (RESTASIS) 0.05 % OPHTHALMIC EMULSION    Place 1 drop into both eyes 2 (two) times daily.     DICLOFENAC SODIUM (VOLTAREN) 1 % GEL    Apply 1 application topically 4 (four) times daily as needed (knee pain). For both knees   FUROSEMIDE (LASIX) 20 MG TABLET    Take 20 mg by mouth daily.     GABAPENTIN (NEURONTIN) 300 MG CAPSULE    Take 300 mg by mouth 3 (three) times daily.    INSULIN DETEMIR (LEVEMIR) 100 UNIT/ML INJECTION     Inject 10 Units into the skin at bedtime.    IPRATROPIUM-ALBUTEROL (DUONEB) 0.5-2.5 (3) MG/3ML SOLN    Take 3 mLs by nebulization every 6 (six) hours as needed.   METOPROLOL TARTRATE (LOPRESSOR) 12.5 MG TABS TABLET    Take 12.5 mg by mouth 2 (two) times daily.   MULTIPLE VITAMINS-MINERALS (MULTIVITAMINS THER. W/MINERALS) TABS    Take 1 tablet by mouth daily.     OMEPRAZOLE (PRILOSEC OTC) 20 MG TABLET    Take 20 mg by mouth daily.   PROMETHAZINE (PHENERGAN) 25 MG SUPPOSITORY    Place 25 mg rectally every 6 (six) hours as needed for nausea or vomiting.   ROPINIROLE (REQUIP) 0.25 MG TABLET    Take 0.25 mg by mouth 3 (three) times daily.    SENNA (SENOKOT) 8.6 MG TABS    Take 1 tablet by mouth at bedtime.     VENLAFAXINE (EFFEXOR) 37.5 MG TABLET    Take 75 mg by mouth 2 (two) times daily.   Modified Medications   Modified Medication Previous Medication   LORAZEPAM (ATIVAN) 0.5 MG TABLET LORazepam (ATIVAN) 0.5 MG tablet      Take one tablet by mouth every 8 hours as needed for agitation/anxiety    Take 0.5 mg by mouth every 6 (six) hours as needed for anxiety.   OXYCODONE-ACETAMINOPHEN (PERCOCET/ROXICET) 5-325 MG PER  TABLET oxyCODONE-acetaminophen (PERCOCET/ROXICET) 5-325 MG per tablet      Take one tablet by mouth every 6 hours as needed For pain    Take one tablet by mouth every 6 hours as needed For pain   TRAZODONE (DESYREL) 50 MG TABLET traZODone (DESYREL) 50 MG tablet      Take 50 mg by mouth at bedtime. For insomnia    Take 1 tablet (50 mg total) by mouth at bedtime as needed for sleep. For insomnia  Discontinued Medications   BACLOFEN (LIORESAL) 5 MG TABS    Take 5 mg by mouth 3 (three) times daily as needed (neck spasms).   FOOD THICKENER (THICK IT) POWD    Take 1 Container by mouth as needed (for honey thick liquids).   METOPROLOL SUCCINATE (TOPROL-XL) 25 MG 24 HR TABLET    Take 25 mg by mouth daily.     VITAMIN D, ERGOCALCIFEROL, (DRISDOL) 50000 UNITS CAPS    Take 50,000 Units by mouth  every 7 (seven) days. mondays    Physical Exam: Filed Vitals:   02/16/13 1732  BP: 119/53  Pulse: 62  Temp: 97.5 F (36.4 C)  Resp: 20  Height: 5' (1.524 m)  Weight: 145 lb (65.772 kg)  SpO2: 96%   Physical Exam  Constitutional: She appears well-developed and well-nourished. No distress.  Cardiovascular: Normal rate, regular rhythm, normal heart sounds and intact distal pulses.   Pulmonary/Chest: Effort normal and breath sounds normal. No respiratory distress.  Abdominal: Soft. Bowel sounds are normal. She exhibits no distension and no mass. There is no tenderness.  Musculoskeletal: Normal range of motion.  Neurological: She is alert.  Skin: Skin is warm and dry. There is pallor.  Psychiatric: She has a normal mood and affect.    Assessment/Plan 1. Osteoarthrosis, unspecified whether generalized or localized, unspecified site -will add voltaren gel to regimen, already on percocet for pain  2. Unspecified essential hypertension -bp slightly elevated, will need to track over time, not on meds, but is on effexor and c/o pain both could increase bp  3. Type II or unspecified type diabetes mellitus with neurological manifestations, not stated as uncontrolled -good control with levemir  4. Diabetic autonomic neuropathy associated with diabetes mellitus due to underlying condition -cont gabapentin 300mg  po tid  5. Depression -cont effexor, lorazepam prn (can affect cognition), and trazodone for sleep   6. Senile osteoporosis -cont vitamin D 50000 units weekly and oscal with D daily   Family/ staff Communication: seen with unit supervisor  Goals of care: DNR, long term care resident

## 2013-03-31 ENCOUNTER — Other Ambulatory Visit: Payer: Self-pay | Admitting: *Deleted

## 2013-03-31 MED ORDER — OXYCODONE-ACETAMINOPHEN 5-325 MG PO TABS: ORAL_TABLET | ORAL | Status: DC

## 2013-03-31 NOTE — Telephone Encounter (Signed)
[facility] [place] 

## 2013-04-13 ENCOUNTER — Non-Acute Institutional Stay (SKILLED_NURSING_FACILITY): Payer: Medicare Other | Admitting: Internal Medicine

## 2013-04-13 ENCOUNTER — Encounter: Payer: Self-pay | Admitting: Internal Medicine

## 2013-04-13 DIAGNOSIS — B369 Superficial mycosis, unspecified: Secondary | ICD-10-CM

## 2013-04-13 DIAGNOSIS — I69991 Dysphagia following unspecified cerebrovascular disease: Secondary | ICD-10-CM

## 2013-04-13 DIAGNOSIS — I672 Cerebral atherosclerosis: Secondary | ICD-10-CM

## 2013-04-13 DIAGNOSIS — Z8673 Personal history of transient ischemic attack (TIA), and cerebral infarction without residual deficits: Secondary | ICD-10-CM

## 2013-04-13 NOTE — Progress Notes (Signed)
Patient ID: Shelia Webb, female   DOB: 12-22-1921, 80 y.o.   MRN: [id number]  Location:  [facility] SN[facility] Provider:  Jonelle Sidle L. Mariea Clonts, D.O., C.M.D.  Code Status: DNR  Chief Complaint  Patient presents with  . Acute Visit    rash on arms and back    HPI:  80 yo female long term care resident seen for AV due to rash on her arms and back.    Review of Systems:  Review of Systems  Eyes: Positive for blurred vision.  Respiratory: Negative for shortness of breath.   Cardiovascular: Negative for chest pain.  Gastrointestinal: Negative for abdominal pain.  Musculoskeletal: Negative for falls.  Skin: Positive for itching and rash.  Neurological: Negative for dizziness and headaches.  Psychiatric/Behavioral: Positive for depression and memory loss.    Medications: Patient's Medications  New Prescriptions   No medications on file  Previous Medications   ARTIFICIAL TEAR OINTMENT (LACRI-LUBE OP)    Place 1 drop into both eyes at bedtime.    ASPIRIN EC 81 MG TABLET    Take 81 mg by mouth daily.   BACLOFEN (LIORESAL) 5 MG TABS    Take 5 mg by mouth 3 (three) times daily as needed (neck spasms).   CALCIUM-VITAMIN D (OSCAL WITH D) 500-200 MG-UNIT PER TABLET    Take 1 tablet by mouth daily.     CYCLOSPORINE (RESTASIS) 0.05 % OPHTHALMIC EMULSION    Place 1 drop into both eyes 2 (two) times daily.     DICLOFENAC SODIUM (VOLTAREN) 1 % GEL    Apply 1 application topically 4 (four) times daily as needed (knee pain). For both knees   FOOD THICKENER (THICK IT) POWD    Take 1 Container by mouth as needed (for honey thick liquids).   FUROSEMIDE (LASIX) 20 MG TABLET    Take 20 mg by mouth daily.     GABAPENTIN (NEURONTIN) 300 MG CAPSULE    Take 300 mg by mouth 3 (three) times daily.    INSULIN DETEMIR (LEVEMIR) 100 UNIT/ML INJECTION    Inject 15 Units into the skin at bedtime.    IPRATROPIUM-ALBUTEROL (DUONEB) 0.5-2.5 (3) MG/3ML SOLN    Take 3 mLs by nebulization every 6 (six) hours as  needed.   LORAZEPAM (ATIVAN) 0.5 MG TABLET    Take 0.5 mg by mouth every 6 (six) hours as needed for anxiety.   METOPROLOL SUCCINATE (TOPROL-XL) 25 MG 24 HR TABLET    Take 25 mg by mouth daily.     MULTIPLE VITAMINS-MINERALS (MULTIVITAMINS THER. W/MINERALS) TABS    Take 1 tablet by mouth daily.     OMEPRAZOLE (PRILOSEC OTC) 20 MG TABLET    Take 20 mg by mouth daily.   OXYCODONE-ACETAMINOPHEN (PERCOCET/ROXICET) 5-325 MG PER TABLET    Take one tablet by mouth every 6 hours as needed For pain   ROPINIROLE (REQUIP) 0.25 MG TABLET    Take 0.25 mg by mouth 3 (three) times daily.    SENNA (SENOKOT) 8.6 MG TABS    Take 1 tablet by mouth at bedtime.     TRAZODONE (DESYREL) 50 MG TABLET    Take 1 tablet (50 mg total) by mouth at bedtime as needed for sleep. For insomnia   VENLAFAXINE (EFFEXOR) 37.5 MG TABLET    Take 37.5 mg by mouth 2 (two) times daily.   VITAMIN D, ERGOCALCIFEROL, (DRISDOL) 50000 UNITS CAPS    Take 50,000 Units by mouth every 7 (seven) days. mondays  Modified Medications  No medications on file  Discontinued Medications   No medications on file    Physical Exam: Filed Vitals:   04/13/13 1229  BP: 128/72  Pulse: 78  Temp: 98.8 F (37.1 C)  Resp: 20  Height: 5' (1.524 m)  Weight: 140 lb (63.504 kg)  SpO2: 96%  Physical Exam  Cardiovascular: Normal rate and regular rhythm.   Pulmonary/Chest: Effort normal and breath sounds normal.  Abdominal: Soft. Bowel sounds are normal. She exhibits no distension and no mass. There is no tenderness.  colostomy  Neurological: She is alert.  Skin: Skin is warm and dry. Rash noted.  Papular rash with evidence of excoriation of arms and upper back    Labs reviewed: Basic Metabolic Panel:  Recent Labs  06/20/12 0410 06/21/12 0400 06/22/12 0510  NA 147* 143 139  K 3.7 3.1* 3.6  CL 110 107 106  CO2 25 24 22   GLUCOSE 108* 89 126*  BUN 12 11 13   CREATININE 0.62 0.56 0.54  CALCIUM 9.1 8.9 8.7    Liver Function Tests:  Recent  Labs  06/17/12 2221 06/18/12 0735  AST 21 15  ALT 17 13  ALKPHOS 106 85  BILITOT 0.4 0.5  PROT 8.4* 6.7  ALBUMIN 4.0 3.1*    CBC:  Recent Labs  06/17/12 2221 06/18/12 0217 06/18/12 0745 06/20/12 0410  WBC 16.6* 11.9* 10.9* 9.2  NEUTROABS 13.6*  --  7.8*  --   HGB 18.0* 16.5* 15.5* 15.4*  HCT 52.0* 48.0* 45.9 44.6  MCV 88.3 88.6 89.5 87.5  PLT 182 160 166 185    Assessment/Plan 1. Fungal rash of torso -clotrimazole cream until resolves, if no improvement, will reassess  2. Dysphagia as late effect of cerebrovascular disease CONCHO NAS pureed diet with honey thickened liquid Aspiration precautions Working with speech therapy  3. Cerebrovascular disease, arteriosclerotic, post-stroke -cont secondary prevention, has weakness, dysarthria, aphasia, visual loss, and dysphagia  Family/ staff Communication: seen with unit supervisor  Goals of care: long term care resident, DNR, family requests hospitalization if needed  Labs/tests ordered:  None today

## 2013-04-30 ENCOUNTER — Non-Acute Institutional Stay (SKILLED_NURSING_FACILITY): Payer: Medicare Other | Admitting: Internal Medicine

## 2013-04-30 ENCOUNTER — Encounter: Payer: Self-pay | Admitting: Internal Medicine

## 2013-04-30 DIAGNOSIS — F0151 Vascular dementia with behavioral disturbance: Secondary | ICD-10-CM

## 2013-04-30 DIAGNOSIS — F3289 Other specified depressive episodes: Secondary | ICD-10-CM

## 2013-04-30 DIAGNOSIS — F02818 Dementia in other diseases classified elsewhere, unspecified severity, with other behavioral disturbance: Secondary | ICD-10-CM

## 2013-04-30 DIAGNOSIS — E1140 Type 2 diabetes mellitus with diabetic neuropathy, unspecified: Secondary | ICD-10-CM

## 2013-04-30 DIAGNOSIS — E1142 Type 2 diabetes mellitus with diabetic polyneuropathy: Secondary | ICD-10-CM

## 2013-04-30 DIAGNOSIS — G309 Alzheimer's disease, unspecified: Secondary | ICD-10-CM

## 2013-04-30 DIAGNOSIS — F01518 Vascular dementia, unspecified severity, with other behavioral disturbance: Secondary | ICD-10-CM

## 2013-04-30 DIAGNOSIS — M1990 Unspecified osteoarthritis, unspecified site: Secondary | ICD-10-CM

## 2013-04-30 DIAGNOSIS — F0280 Dementia in other diseases classified elsewhere without behavioral disturbance: Secondary | ICD-10-CM

## 2013-04-30 DIAGNOSIS — M79602 Pain in left arm: Secondary | ICD-10-CM

## 2013-04-30 DIAGNOSIS — F0281 Dementia in other diseases classified elsewhere with behavioral disturbance: Secondary | ICD-10-CM

## 2013-04-30 DIAGNOSIS — E1149 Type 2 diabetes mellitus with other diabetic neurological complication: Secondary | ICD-10-CM

## 2013-04-30 DIAGNOSIS — F329 Major depressive disorder, single episode, unspecified: Secondary | ICD-10-CM

## 2013-04-30 DIAGNOSIS — F0150 Vascular dementia without behavioral disturbance: Secondary | ICD-10-CM

## 2013-04-30 DIAGNOSIS — F0393 Unspecified dementia, unspecified severity, with mood disturbance: Secondary | ICD-10-CM

## 2013-04-30 DIAGNOSIS — G2581 Restless legs syndrome: Secondary | ICD-10-CM

## 2013-04-30 DIAGNOSIS — M79609 Pain in unspecified limb: Secondary | ICD-10-CM

## 2013-04-30 NOTE — Progress Notes (Signed)
Patient ID: Shelia Webb, female   DOB: 06-25-1921, 80 y.o.   MRN: [id number]    Chief Complaint  Patient presents with  . Medical Managment of Chronic Issues    depression ,HTN, DM, left arm pain   Allergies  Allergen Reactions  . Penicillins Other (See Comments)    Redness and skin irritation   HPI 80 y/o female patient is seen for routine visit. She has hx of stroke, macular degeneration, DM, OA among others. Staff have noticed bruise across her chest and she complaints of pain in her left arm for few days. No witnessed fall reported. Patient denies any fall. Staff had tried to lift her up without a hoyer lift for transfer few days back. cbg reviewed. few reading on 208, 286 03/21/13, 298 04/28/13, otherwise below 118-157. No hypoglycemia noted in last month cbg review She has hearing loss  Review of Systems   Constitutional: Negative for malaise/fatigue.   HENT: Negative for congestion.    Eyes: Negative for blurred vision.   Respiratory: Negative for shortness of breath.    Cardiovascular: Negative for chest pain.   Gastrointestinal: Negative for abdominal pain.   Genitourinary: Negative for dysuria.   Musculoskeletal: Negative for falls.   Skin: Negative for rash.      Psychiatric/Behavioral: Positive for memory loss.   Current Outpatient Prescriptions on File Prior to Visit  Medication Sig Dispense Refill  . Artificial Tear Ointment (LACRI-LUBE OP) Place 1 drop into both eyes at bedtime.       Marland Kitchen aspirin EC 81 MG tablet Take 81 mg by mouth daily.      . calcium-vitamin D (OSCAL WITH D) 500-200 MG-UNIT per tablet Take 1 tablet by mouth daily.        . cholecalciferol (VITAMIN D) 1000 UNITS tablet Take 2,000 Units by mouth daily.      . cycloSPORINE (RESTASIS) 0.05 % ophthalmic emulsion Place 1 drop into both eyes 2 (two) times daily.        . diclofenac sodium (VOLTAREN) 1 % GEL Apply 1 application topically 4 (four) times daily as needed (knee pain). For both knees      .  furosemide (LASIX) 20 MG tablet Take 20 mg by mouth daily.        Marland Kitchen gabapentin (NEURONTIN) 300 MG capsule Take 300 mg by mouth 3 (three) times daily.       . insulin detemir (LEVEMIR) 100 UNIT/ML injection Inject 10 Units into the skin at bedtime.       Marland Kitchen ipratropium-albuterol (DUONEB) 0.5-2.5 (3) MG/3ML SOLN Take 3 mLs by nebulization every 6 (six) hours as needed.  360 mL  11  . LORazepam (ATIVAN) 0.5 MG tablet Take 0.5 mg by mouth every 6 (six) hours as needed for anxiety.      . metoprolol tartrate (LOPRESSOR) 12.5 mg TABS tablet Take 12.5 mg by mouth 2 (two) times daily.      . Multiple Vitamins-Minerals (MULTIVITAMINS THER. W/MINERALS) TABS Take 1 tablet by mouth daily.        Marland Kitchen omeprazole (PRILOSEC OTC) 20 MG tablet Take 20 mg by mouth daily.      Marland Kitchen oxyCODONE-acetaminophen (PERCOCET/ROXICET) 5-325 MG per tablet Take one tablet by mouth every 6 hours as needed For pain  120 tablet  0  . promethazine (PHENERGAN) 25 MG suppository Place 25 mg rectally every 6 (six) hours as needed for nausea or vomiting.      Marland Kitchen rOPINIRole (REQUIP) 0.25 MG tablet Take 0.25 mg  by mouth 3 (three) times daily.       Marland Kitchen senna (SENOKOT) 8.6 MG TABS Take 1 tablet by mouth at bedtime.        Marland Kitchen venlafaxine (EFFEXOR) 37.5 MG tablet Take 75 mg by mouth 2 (two) times daily.        No current facility-administered medications on file prior to visit.   Past Medical History  Diagnosis Date  . Hypertension   . Diabetes mellitus   . Dysrhythmia     a-fib  . Anemia   . Acute respiratory failure   . Chronic kidney disease     Kidney failure  . Allergic rhinitis   . Osteoarthrosis   . Osteoporosis   . Anxiety   . Depression   . Restless leg syndrome   . GERD (gastroesophageal reflux disease)   . Hypertonic bladder   . Peripheral vascular disease   . Coronary atherosclerosis due to lipid rich plaque   . DNR (do not resuscitate)    Past Surgical History  Procedure Laterality Date  . Colon surgery      has  colostomy  . Abdominal hysterectomy    . Appendectomy    . Tonsillectomy    . Cardiac catheterization      bilateral cataract surgery  . Diaphragmatic hernia repair      with gangrene  . Orif patella  12/07/2010    Procedure: OPEN REDUCTION INTERNAL (ORIF) FIXATION PATELLA;  Surgeon: Newt Minion, MD;  Location: [place];  Service: Orthopedics;  Laterality: Left;  Left Knee Hamstring Release and Open Reduction Internal Fixation Patella  . Orif patella  12/21/2010    Procedure: OPEN REDUCTION INTERNAL (ORIF) FIXATION PATELLA;  Surgeon: Newt Minion, MD;  Location: [place];  Service: Orthopedics;  Laterality: Left;  Left Knee Hamstring Release and Open Reduction Internal Fixation Patella    Physical exam BP 133/65  Pulse 71  Temp(Src) 98 F (36.7 C)  Resp 18  Ht 5' (1.524 m)  Wt 139 lb (63.05 kg)  BMI 27.15 kg/m2  SpO2 96%  Constitutional: No distress.   HENT:   Head: Normocephalic and atraumatic.   Chest: extensive bruise across her chest and underneath breast area- resolving Cardiovascular: Normal rate, regular rhythm, normal heart sounds    Pulmonary/Chest: Effort normal and breath sounds normal. No respiratory distress.   Abdominal: Soft. Bowel sounds are normal. She exhibits no distension. There is no tenderness.  Musculoskeletal: She exhibits b/l lower extremity edema. Left arm has bruise on inner side and tenderness on exam. Is able to rotate at shoulder area with some discomfort Neurological: She is alert.   Skin: Skin is warm and dry.   Labs- 04-15-12: wbc 5.2; hgb 14.5; hct 43.7; mcv 88.1; plt 144; glucose 86; bun 16; creat .98; k+ 3.8; na++139 04-22-12: mag 2.0 05-04-12: hgb a1c 7.0 06-20-12: wbc 9.2; hgb 15.4; hct 44.6 ;mcv 87.5; plt 185 06-22-12: glucose 126; bun 13; creat 0.54; k+3.6; na++126   07-01-12: tsh 1.053; vit b12: 376; folate 14.9 08-24-12: hgb a1c 6.3   10-23-12 30-89 11-23-12: na 140, k 4.3, cl 104, co2 27, glu 78, bun 18, cr 0.82, ca 9, a1c 6.6 03-01-13 a1c  7.2  ASSESSMENT/ PLAN:  Left arm pain ? Trauma during transfer of patient. Resolving bruise in the area. Will get xray of left shoulder, arm and elbow area to rule out fracture.   Edema continue her lasix 20 mg daily and will monitor her status   arthritis Continue  diclofenac sodium gel with oxycodone-apap 5-325 q6h prn for pain. Continue vitamin d  Dm type 2 Reviewed recent a1c and cbg. continue levemir 10 u daily. Monitor cbg  Depression in setting of dementia continue effexor 75 mg twice daily and ativan 0.5 mg every 6 hours as needed for anxiety and trazodone 50 mg daily, monitor her symptoms  Peripheral neuropathy Will continue neurontin 300 mg three times daily   GERD Will continue prilosec 20 mg daily   HYPERTENSION Continue metoprolol tartrate 12.5 mg bid, monitor bp readings  Restless leg syndrome Continue requip 0.25 mg tid for now

## 2013-05-04 ENCOUNTER — Non-Acute Institutional Stay (SKILLED_NURSING_FACILITY): Payer: Medicare Other | Admitting: Internal Medicine

## 2013-05-04 DIAGNOSIS — S2000XA Contusion of breast, unspecified breast, initial encounter: Secondary | ICD-10-CM

## 2013-05-04 DIAGNOSIS — N6320 Unspecified lump in the left breast, unspecified quadrant: Secondary | ICD-10-CM

## 2013-05-04 DIAGNOSIS — N63 Unspecified lump in unspecified breast: Secondary | ICD-10-CM

## 2013-05-04 DIAGNOSIS — S2002XA Contusion of left breast, initial encounter: Secondary | ICD-10-CM

## 2013-05-04 NOTE — Progress Notes (Signed)
Patient ID: Shelia Webb, female   DOB: [birth year], 80 y.o.   MRN: [id number]  Location:  [facility] Provider:  Jonelle Sidle L. Mariea Clonts, D.O., C.M.D.  Code Status:  DNR  Chief Complaint  Patient presents with  . Acute Visit    left breast has hard lump in it    HPI:  80 yo white female here for long term care with h/o stroke, colostomy.  She was seen due to a hard lump over her left breast--she also had a bruise and left arm pain noted on 4/10.  The bruising has since moved distally in the breast and upper chest, and she has a hard area present.  Review of Systems:  Review of Systems  Constitutional: Negative for fever.  HENT: Negative for congestion.   Eyes: Positive for blurred vision and double vision.  Respiratory: Negative for shortness of breath.   Cardiovascular: Positive for chest pain.       Superficial  Gastrointestinal:       Colostomy  Genitourinary: Negative for dysuria.  Musculoskeletal: Positive for joint pain. Negative for falls.  Neurological: Positive for sensory change and focal weakness.  Endo/Heme/Allergies: Bruises/bleeds easily.  Psychiatric/Behavioral: Positive for memory loss.    Medications: Patient's Medications  New Prescriptions   No medications on file  Previous Medications   ARTIFICIAL TEAR OINTMENT (LACRI-LUBE OP)    Place 1 drop into both eyes at bedtime.    ASPIRIN EC 81 MG TABLET    Take 81 mg by mouth daily.   CALCIUM-VITAMIN D (OSCAL WITH D) 500-200 MG-UNIT PER TABLET    Take 1 tablet by mouth daily.     CHOLECALCIFEROL (VITAMIN D) 1000 UNITS TABLET    Take 2,000 Units by mouth daily.   CYCLOSPORINE (RESTASIS) 0.05 % OPHTHALMIC EMULSION    Place 1 drop into both eyes 2 (two) times daily.    DICLOFENAC SODIUM (VOLTAREN) 1 % GEL    Apply 1 application topically 4 (four) times daily as needed (knee pain). For both knees   FUROSEMIDE (LASIX) 20 MG TABLET    Take 20 mg by mouth daily.     GABAPENTIN (NEURONTIN) 300 MG CAPSULE     Take 300 mg by mouth 3 (three) times daily.    INSULIN DETEMIR (LEVEMIR) 100 UNIT/ML INJECTION    Inject 10 Units into the skin at bedtime.    IPRATROPIUM-ALBUTEROL (DUONEB) 0.5-2.5 (3) MG/3ML SOLN    Take 3 mLs by nebulization every 6 (six) hours as needed.   METOPROLOL TARTRATE (LOPRESSOR) 12.5 MG TABS TABLET    Take 12.5 mg by mouth 2 (two) times daily.   MULTIPLE VITAMINS-MINERALS (MULTIVITAMINS THER. W/MINERALS) TABS    Take 1 tablet by mouth daily.     OMEPRAZOLE (PRILOSEC OTC) 20 MG TABLET    Take 20 mg by mouth daily.   PROMETHAZINE (PHENERGAN) 25 MG SUPPOSITORY    Place 25 mg rectally every 6 (six) hours as needed for nausea or vomiting.   ROPINIROLE (REQUIP) 0.25 MG TABLET    Take 0.25 mg by mouth 3 (three) times daily.    SENNA (SENOKOT) 8.6 MG TABS    Take 1 tablet by mouth at bedtime.     TRAZODONE (DESYREL) 50 MG TABLET    Take 50 mg by mouth at bedtime. For insomnia   VENLAFAXINE (EFFEXOR) 37.5 MG TABLET    Take 50 mg by mouth 2 (two) times daily.   Modified Medications   Modified Medication Previous Medication  LORAZEPAM (ATIVAN) 0.5 MG TABLET LORazepam (ATIVAN) 0.5 MG tablet      Take one tablet by mouth every 8 hours as needed for agitation/anxiety    Take 0.5 mg by mouth every 6 (six) hours as needed for anxiety.   OXYCODONE-ACETAMINOPHEN (PERCOCET/ROXICET) 5-325 MG PER TABLET oxyCODONE-acetaminophen (PERCOCET/ROXICET) 5-325 MG per tablet      Take one tablet by mouth every 6 hours as needed For pain    Take one tablet by mouth every 6 hours as needed For pain  Discontinued Medications   No medications on file    Physical Exam: Filed Vitals:   11/06/13 1329  BP: 134/73  Pulse: 72  Temp: 97.5 F (36.4 C)  Resp: 18  Height: 5' (1.524 m)  Weight: 139 lb (63.05 kg)  SpO2: 96%   Physical Exam  Constitutional: No distress.  Cardiovascular: Normal rate, regular rhythm and normal heart sounds.   Pulmonary/Chest: Effort normal and breath sounds normal.  Chest wall  especially on her left with yellow ecchymotic areas present--evidence of migration of the ecchymoses distally on her breast and chest wall, has 2cm, hard mass with ecchymoses over it on left breast, tender  Abdominal: Soft. Bowel sounds are normal.  Colostomy in place with soft brown stool  Musculoskeletal:  hemiplegia  Neurological: She is alert.     Labs reviewed: Basic Metabolic Panel:  Recent Labs  08/28/13  NA 141  K 4.4  BUN 11  CREATININE 0.7    Liver Function Tests:  Recent Labs  08/28/13  AST 16  ALT 15  ALKPHOS 73    CBC:  Recent Labs  08/28/13  WBC 5.2  HGB 15.3  HCT 45  PLT 151     Assessment/Plan 1. Traumatic ecchymosis of left female breast, initial encounter -unclear how this injury occurred (transfer?) -is slowly improving -may use heat or ice if this relieves discomfort  2. Left breast mass -suspect large hematoma considering visible bruises -assess to r/o malignancy with ultrasound (pt unable to stand for mammogram and breast is very painful at present)   Family/ staff Communication: seen with unit supervisor Goals of care: long term care resident, DNR  Labs/tests ordered:  [place] left breast

## 2013-05-06 ENCOUNTER — Other Ambulatory Visit: Payer: Self-pay | Admitting: Internal Medicine

## 2013-05-06 DIAGNOSIS — N6320 Unspecified lump in the left breast, unspecified quadrant: Secondary | ICD-10-CM

## 2013-05-17 ENCOUNTER — Ambulatory Visit
Admission: RE | Admit: 2013-05-17 | Discharge: 2013-05-17 | Disposition: A | Discharge status: Home / Self Care | Payer: Medicare Other | Source: Ambulatory Visit | Attending: Internal Medicine | Admitting: Internal Medicine

## 2013-05-17 DIAGNOSIS — N6320 Unspecified lump in the left breast, unspecified quadrant: Secondary | ICD-10-CM

## 2013-05-28 ENCOUNTER — Other Ambulatory Visit: Payer: Self-pay | Admitting: *Deleted

## 2013-05-28 ENCOUNTER — Non-Acute Institutional Stay (SKILLED_NURSING_FACILITY): Payer: Medicare Other | Admitting: Internal Medicine

## 2013-05-28 DIAGNOSIS — F0393 Unspecified dementia, unspecified severity, with mood disturbance: Secondary | ICD-10-CM

## 2013-05-28 DIAGNOSIS — G609 Hereditary and idiopathic neuropathy, unspecified: Secondary | ICD-10-CM

## 2013-05-28 DIAGNOSIS — F329 Major depressive disorder, single episode, unspecified: Secondary | ICD-10-CM

## 2013-05-28 DIAGNOSIS — F0280 Dementia in other diseases classified elsewhere without behavioral disturbance: Secondary | ICD-10-CM

## 2013-05-28 DIAGNOSIS — F3289 Other specified depressive episodes: Secondary | ICD-10-CM

## 2013-05-28 DIAGNOSIS — E1149 Type 2 diabetes mellitus with other diabetic neurological complication: Secondary | ICD-10-CM

## 2013-05-28 DIAGNOSIS — I10 Essential (primary) hypertension: Secondary | ICD-10-CM

## 2013-05-28 DIAGNOSIS — G629 Polyneuropathy, unspecified: Secondary | ICD-10-CM

## 2013-05-28 MED ORDER — LORAZEPAM 0.5 MG PO TABS: ORAL_TABLET | ORAL | Status: DC

## 2013-05-28 NOTE — Progress Notes (Signed)
Patient ID: AMENA DOCKHAM, female   DOB: 1921-03-19, 80 y.o.   MRN: [id number]    [facility] Gemma living [facility]  Chief Complaint  Patient presents with  . Medical Management of Chronic Issues    RV   Allergies  Allergen Reactions  . Penicillins Other (See Comments)    Redness and skin irritation   DNR  HPI 80 y/o female patient is seen for routine visit. She has hx of HTN, dementia, stroke,DM with neuropathy, macular degeneration, OA. The bruise in left breast area is resolving. Reviewed ultrasound of the breast. Reviewed her cbg reading: lowest 99 and highest 293 with most of the readings 116-173.   Review of Systems   Constitutional: Negative for malaise/fatigue.   HENT: Negative for congestion.    Eyes: Negative for blurred vision.   Respiratory: Negative for shortness of breath.    Cardiovascular: Negative for chest pain.   Gastrointestinal: Negative for abdominal pain.   Genitourinary: Negative for dysuria.   Musculoskeletal: Negative for falls.   Skin: Negative for rash.      Psychiatric/Behavioral: Positive for memory loss  Past Medical History  Diagnosis Date  . Hypertension   . Diabetes mellitus   . Dysrhythmia     a-fib  . Anemia   . Acute respiratory failure   . Chronic kidney disease     Kidney failure  . Allergic rhinitis   . Osteoarthrosis   . Osteoporosis   . Anxiety   . Depression   . Restless leg syndrome   . GERD (gastroesophageal reflux disease)   . Hypertonic bladder   . Peripheral vascular disease   . Coronary atherosclerosis due to lipid rich plaque   . DNR (do not resuscitate)    Past Surgical History  Procedure Laterality Date  . Colon surgery      has colostomy  . Abdominal hysterectomy    . Appendectomy    . Tonsillectomy    . Cardiac catheterization      bilateral cataract surgery  . Diaphragmatic hernia repair      with gangrene  . Orif patella  12/07/2010    Procedure: OPEN REDUCTION INTERNAL (ORIF) FIXATION PATELLA;  Surgeon:  Newt Minion, MD;  Location: [place];  Service: Orthopedics;  Laterality: Left;  Left Knee Hamstring Release and Open Reduction Internal Fixation Patella  . Orif patella  12/21/2010    Procedure: OPEN REDUCTION INTERNAL (ORIF) FIXATION PATELLA;  Surgeon: Newt Minion, MD;  Location: [place];  Service: Orthopedics;  Laterality: Left;  Left Knee Hamstring Release and Open Reduction Internal Fixation Patella   Medication reviewed. See [facility]   Physical exam BP 139/67  Pulse 78  Temp(Src) 97.2 F (36.2 C)  Resp 18  Ht 5' (1.524 m)  Wt 135 lb (61.236 kg)  BMI 26.37 kg/m2  SpO2 95%  Constitutional: elderly female in No distress.   HENT:   Head: Normocephalic and atraumatic.   Cardiovascular: Normal rate, regular rhythm, normal heart sounds and intact distal pulses.    Pulmonary/Chest: Effort normal and breath sounds normal. No respiratory distress.   Abdominal: Soft. Bowel sounds are normal. She exhibits no distension. There is no tenderness.  Musculoskeletal: She exhibits non pitting edema with chronic stasis, edema improved from before Neurological: She is alert.   Skin: Skin is warm and dry.   Labs- 11-23-12: na 140, k 4.3, cl 104, co2 27, glu 78, bun 18, cr 0.82, ca 9, a1c 6.6 05/03/13 wbc 7.9, hb 14, hct 41.7, plt  162, na 141, k 3.7, bun 16, cr 0.83, glu 117, a1c 6.8  ASSESSMENT/ PLAN:  Peripheral neuropathy Will continue neurontin 300 mg three times daily and continue requip to help with RLS  Dm type 2 Reviewed recent a1c. On levemir 10 u daily at present. Monitor cbg. Continue aspirin. Check a1c and lipid panel  Depression with dementia continue effexor 75 mg twice daily and change ativan to 0.5 mg every 8 hours as needed for anxiety.   HTN Continue lopressor 12.5 mg bid, lasix 20 mg daily- monitor bp reading and check cmp  Labs- cbc, lipid, cmp, vit d, a1c in 3 months

## 2013-05-28 NOTE — Telephone Encounter (Signed)
[facility] [place] 

## 2013-07-02 ENCOUNTER — Non-Acute Institutional Stay (SKILLED_NURSING_FACILITY): Payer: Medicare Other | Admitting: Internal Medicine

## 2013-07-02 DIAGNOSIS — B86 Scabies: Secondary | ICD-10-CM

## 2013-07-02 NOTE — Progress Notes (Signed)
Patient ID: Shelia Webb, female   DOB: 1921/04/18, 80 y.o.   MRN: [id number]    Facility: [facility]  Chief Complaint  Patient presents with  . Acute Visit    rash and itching   Allergies  Allergen Reactions  . Penicillins Other (See Comments)    Redness and skin irritation   HPI 80 y/o female patient is seen for itching and rash in her arms for 3-4 days. It is difficult to obtain HPI and ROS from her with her dementia. She has scratch mark in her hands and staff have noticed increased scratching.   ROS No fever or chills No nausea or vomiting No abdominal pain No rash elsewhere  Past Medical History  Diagnosis Date  . Hypertension   . Diabetes mellitus   . Dysrhythmia     a-fib  . Anemia   . Acute respiratory failure   . Chronic kidney disease     Kidney failure  . Allergic rhinitis   . Osteoarthrosis   . Osteoporosis   . Anxiety   . Depression   . Restless leg syndrome   . GERD (gastroesophageal reflux disease)   . Hypertonic bladder   . Peripheral vascular disease   . Coronary atherosclerosis due to lipid rich plaque   . DNR (do not resuscitate)    Medication reviewed. See [facility]  Physical exam BP 140/78  Pulse 76  Temp(Src) 98.1 F (36.7 C)  Resp 18  Wt 134 lb (60.782 kg)  Constitutional: No distress.   HENT:   Head: Normocephalic and atraumatic.   Cardiovascular: Normal rate, regular rhythm, normal heart sounds    Pulmonary/Chest: Effort normal and breath sounds normal. No respiratory distress.   Abdominal: Soft. Bowel sounds are normal. She exhibits no distension. There is no tenderness.  Musculoskeletal: She exhibits b/l lower extremity edema.  Neurological: She is alert.   Skin: Skin is warm and dry. Has erythema with scratch marks in both hands and in between her fingers. Small erythematous papules and few excoriated and crusted areas between digits. No pustules noted  Labs- 04-15-12: wbc 5.2; hgb 14.5; hct 43.7; mcv 88.1;  plt 144; glucose 86; bun 16; creat .98; k+ 3.8; na++139 04-22-12: mag 2.0 05-04-12: hgb a1c 7.0 06-20-12: wbc 9.2; hgb 15.4; hct 44.6 ;mcv 87.5; plt 185 06-22-12: glucose 126; bun 13; creat 0.54; k+3.6; na++126   07-01-12: tsh 1.053; vit b12: 376; folate 14.9 08-24-12: hgb a1c 6.3   10-23-12 30-89 11-23-12: na 140, k 4.3, cl 104, co2 27, glu 78, bun 18, cr 0.82, ca 9, a1c 6.6 03-01-13 a1c 7.2  ASSESSMENT/ PLAN:  Scabies Concern for scabies with her skin lesion and pruritis. Will have her on ivermectin 200 mcg/kg= 1200 mcg x 1 dose. Repeat dose in 2 weeks if needed. Reassess. Wash her belongings seperately in hot water

## 2013-07-16 ENCOUNTER — Non-Acute Institutional Stay (SKILLED_NURSING_FACILITY): Payer: Medicare Other | Admitting: Internal Medicine

## 2013-07-16 DIAGNOSIS — F329 Major depressive disorder, single episode, unspecified: Secondary | ICD-10-CM

## 2013-07-16 DIAGNOSIS — E0842 Diabetes mellitus due to underlying condition with diabetic polyneuropathy: Secondary | ICD-10-CM

## 2013-07-16 DIAGNOSIS — E1349 Other specified diabetes mellitus with other diabetic neurological complication: Secondary | ICD-10-CM

## 2013-07-16 DIAGNOSIS — F0280 Dementia in other diseases classified elsewhere without behavioral disturbance: Secondary | ICD-10-CM

## 2013-07-16 DIAGNOSIS — E1142 Type 2 diabetes mellitus with diabetic polyneuropathy: Secondary | ICD-10-CM

## 2013-07-16 DIAGNOSIS — E1149 Type 2 diabetes mellitus with other diabetic neurological complication: Secondary | ICD-10-CM

## 2013-07-16 DIAGNOSIS — F3289 Other specified depressive episodes: Secondary | ICD-10-CM

## 2013-07-16 DIAGNOSIS — F0393 Unspecified dementia, unspecified severity, with mood disturbance: Secondary | ICD-10-CM

## 2013-07-16 DIAGNOSIS — I10 Essential (primary) hypertension: Secondary | ICD-10-CM

## 2013-07-16 NOTE — Progress Notes (Signed)
Patient ID: Shelia Webb, female   DOB: 22-Oct-1921, 80 y.o.   MRN: [id number]    Facility: [facility] Chief Complaint  Patient presents with  . Medical Management of Chronic Issues    routine visit   Allergies  Allergen Reactions  . Penicillins Other (See Comments)    Redness and skin irritation   DNR  HPI 80 y/o female patient is seen for routine visit. She has no new concerns. No new concern from staff. She has hx of HTN, dementia, stroke,DM with neuropathy, macular degeneration, OA.  Reviewed blood sugar readings- 88-189 with one reading of 307 Mood has been stable No falls reported No new skin concerns  Review of Systems   Constitutional: Negative for malaise/fatigue.   HENT: Negative for congestion.    Eyes: Negative for blurred vision.   Respiratory: Negative for shortness of breath.    Cardiovascular: Negative for chest pain.   Gastrointestinal: Negative for abdominal pain.   Genitourinary: Negative for dysuria.   Musculoskeletal: Negative for falls.   Skin: Negative for rash.      Psychiatric/Behavioral: Positive for memory loss  Past Medical History  Diagnosis Date  . Hypertension   . Diabetes mellitus   . Dysrhythmia     a-fib  . Anemia   . Acute respiratory failure   . Chronic kidney disease     Kidney failure  . Allergic rhinitis   . Osteoarthrosis   . Osteoporosis   . Anxiety   . Depression   . Restless leg syndrome   . GERD (gastroesophageal reflux disease)   . Hypertonic bladder   . Peripheral vascular disease   . Coronary atherosclerosis due to lipid rich plaque   . DNR (do not resuscitate)    Medication reviewed. See [facility]  Physical exam BP 118/64  Pulse 70  Temp(Src) 97.4 F (36.3 C)  Resp 18  Ht 5' (1.524 m)  Wt 132 lb (59.875 kg)  BMI 25.78 kg/m2  SpO2 96%    Constitutional: elderly female in no distress.   HENT:   Head: Normocephalic and atraumatic.   Cardiovascular: Normal rate, regular rhythm, normal  heart sounds and intact distal pulses.    Pulmonary/Chest: Effort normal and breath sounds normal. No respiratory distress.   Abdominal: Soft. Bowel sounds are normal. She exhibits no distension. There is no tenderness.  Musculoskeletal: She exhibits non pitting edema with chronic stasis, edema improved from before Neurological: She is alert.   Skin: Skin is warm and dry.   Labs- 11-23-12: na 140, k 4.3, cl 104, co2 27, glu 78, bun 18, cr 0.82, ca 9, a1c 6.6 05/03/13 wbc 7.9, hb 14, hct 41.7, plt 162, na 141, k 3.7, bun 16, cr 0.83, glu 117, a1c 6.8  ASSESSMENT/ PLAN:  Peripheral neuropathy Will continue neurontin 300 mg three times daily and continue requip to help with RLS  Dm type 2 Reviewed recent a1c. On levemir 10 u daily at present. Monitor cbg. Continue aspirin. Check a1c and lipid panel. Consider ACEI if has microalbuminuria  Depression with dementia decrease effexor from 75 mg twice daily to 50 mg twice a day. Continue ativan   HTN Continue lopressor 12.5 mg bid, lasix 20 mg daily- monitor bp reading and check cmp  Labs- cbc, lipid, cmp, vit d, a1c in 3 months

## 2013-07-25 ENCOUNTER — Encounter: Payer: Self-pay | Admitting: Internal Medicine

## 2013-08-20 DIAGNOSIS — F0280 Dementia in other diseases classified elsewhere without behavioral disturbance: Secondary | ICD-10-CM | POA: Insufficient documentation

## 2013-08-20 DIAGNOSIS — F0393 Unspecified dementia, unspecified severity, with mood disturbance: Secondary | ICD-10-CM | POA: Insufficient documentation

## 2013-08-20 DIAGNOSIS — F329 Major depressive disorder, single episode, unspecified: Secondary | ICD-10-CM

## 2013-08-20 DIAGNOSIS — I10 Essential (primary) hypertension: Secondary | ICD-10-CM | POA: Insufficient documentation

## 2013-08-28 LAB — LIPID PANEL
Cholesterol: 160 mg/dL (ref 0–200)
HDL: 42 mg/dL (ref 35–70)
LDL CALC: 84 mg/dL
TRIGLYCERIDES: 169 mg/dL — AB (ref 40–160)

## 2013-08-28 LAB — HEPATIC FUNCTION PANEL
ALK PHOS: 73 U/L (ref 25–125)
ALT: 15 U/L (ref 7–35)
AST: 16 U/L (ref 13–35)
Bilirubin, Total: 0.4 mg/dL

## 2013-08-28 LAB — HEMOGLOBIN A1C: Hgb A1c MFr Bld: 6.8 % — AB (ref 4.0–6.0)

## 2013-08-28 LAB — BASIC METABOLIC PANEL
BUN: 11 mg/dL (ref 4–21)
CREATININE: 0.7 mg/dL (ref <=1.1)
Glucose: 103 mg/dL
POTASSIUM: 4.4 mmol/L (ref 3.4–5.3)
SODIUM: 141 mmol/L (ref 137–147)

## 2013-08-28 LAB — CBC AND DIFFERENTIAL
HEMATOCRIT: 45 % (ref 36–46)
Hemoglobin: 15.3 g/dL (ref 12.0–16.0)
Platelets: 151 10*3/uL (ref 150–399)
WBC: 5.2 10^3/mL

## 2013-09-14 ENCOUNTER — Non-Acute Institutional Stay (SKILLED_NURSING_FACILITY): Payer: Medicare Other | Admitting: Internal Medicine

## 2013-09-14 ENCOUNTER — Encounter: Payer: Self-pay | Admitting: Internal Medicine

## 2013-09-14 DIAGNOSIS — M810 Age-related osteoporosis without current pathological fracture: Secondary | ICD-10-CM

## 2013-09-14 DIAGNOSIS — G4700 Insomnia, unspecified: Secondary | ICD-10-CM

## 2013-09-14 DIAGNOSIS — F329 Major depressive disorder, single episode, unspecified: Secondary | ICD-10-CM

## 2013-09-14 DIAGNOSIS — E0842 Diabetes mellitus due to underlying condition with diabetic polyneuropathy: Secondary | ICD-10-CM

## 2013-09-14 DIAGNOSIS — E1349 Other specified diabetes mellitus with other diabetic neurological complication: Secondary | ICD-10-CM

## 2013-09-14 DIAGNOSIS — I672 Cerebral atherosclerosis: Secondary | ICD-10-CM

## 2013-09-14 DIAGNOSIS — F3289 Other specified depressive episodes: Secondary | ICD-10-CM

## 2013-09-14 DIAGNOSIS — F0393 Unspecified dementia, unspecified severity, with mood disturbance: Secondary | ICD-10-CM

## 2013-09-14 DIAGNOSIS — F0280 Dementia in other diseases classified elsewhere without behavioral disturbance: Secondary | ICD-10-CM

## 2013-09-14 DIAGNOSIS — E1142 Type 2 diabetes mellitus with diabetic polyneuropathy: Secondary | ICD-10-CM

## 2013-09-14 DIAGNOSIS — Z8673 Personal history of transient ischemic attack (TIA), and cerebral infarction without residual deficits: Secondary | ICD-10-CM

## 2013-09-14 DIAGNOSIS — I10 Essential (primary) hypertension: Secondary | ICD-10-CM

## 2013-09-14 DIAGNOSIS — F32A Depression, unspecified: Secondary | ICD-10-CM

## 2013-09-14 NOTE — Progress Notes (Signed)
Patient ID: Shelia Webb, female   DOB: 05-Sep-1921, 80 y.o.   MRN: [id number]  Location:  [facility] Provider:  Jonelle Sidle L. Mariea Clonts, D.O., C.M.D.  Code Status:  DNR  Chief Complaint  Patient presents with  . Medical Management of Chronic Issues    HPI:  79 yo white female long term care resident with h/o strokes, htn, hyperlipidemia, depression, arthritis seen for routine visit.  CBGs reviewed and range in 100s fasting and highest are in 240s in evenings.    Review of Systems:  Review of Systems  Constitutional: Negative for fever.  HENT: Negative for congestion.   Respiratory: Negative for shortness of breath.   Cardiovascular: Negative for chest pain.  Gastrointestinal: Negative for abdominal pain, constipation, blood in stool and melena.  Genitourinary: Negative for dysuria.  Musculoskeletal: Negative for falls.  Skin: Negative for rash.  Neurological: Positive for sensory change, speech change, focal weakness and weakness.  Endo/Heme/Allergies: Bruises/bleeds easily.  Psychiatric/Behavioral: Positive for depression and memory loss. The patient has insomnia.        Insomnia per pt, but staff have not noted that she is awake at night    Medications: Patient's Medications  New Prescriptions   No medications on file  Previous Medications   ARTIFICIAL TEAR OINTMENT (LACRI-LUBE OP)    Place 1 drop into both eyes at bedtime.    ASPIRIN EC 81 MG TABLET    Take 81 mg by mouth daily.   CALCIUM-VITAMIN D (OSCAL WITH D) 500-200 MG-UNIT PER TABLET    Take 1 tablet by mouth daily.     CHOLECALCIFEROL (VITAMIN D) 1000 UNITS TABLET    Take 2,000 Units by mouth daily.   CYCLOSPORINE (RESTASIS) 0.05 % OPHTHALMIC EMULSION    Place 1 drop into both eyes 2 (two) times daily.    DICLOFENAC SODIUM (VOLTAREN) 1 % GEL    Apply 1 application topically 4 (four) times daily as needed (knee pain). For both knees   FUROSEMIDE (LASIX) 20 MG TABLET    Take 20 mg by mouth daily.     GABAPENTIN (NEURONTIN) 300 MG CAPSULE    Take 300 mg by mouth 3 (three) times daily.    INSULIN DETEMIR (LEVEMIR) 100 UNIT/ML INJECTION    Inject 10 Units into the skin at bedtime.    IPRATROPIUM-ALBUTEROL (DUONEB) 0.5-2.5 (3) MG/3ML SOLN    Take 3 mLs by nebulization every 6 (six) hours as needed.   LORAZEPAM (ATIVAN) 0.5 MG TABLET    Take one tablet by mouth every 8 hours as needed for agitation/anxiety   METOPROLOL TARTRATE (LOPRESSOR) 12.5 MG TABS TABLET    Take 12.5 mg by mouth 2 (two) times daily.   MULTIPLE VITAMINS-MINERALS (MULTIVITAMINS THER. W/MINERALS) TABS    Take 1 tablet by mouth daily.     OMEPRAZOLE (PRILOSEC OTC) 20 MG TABLET    Take 20 mg by mouth daily.   OXYCODONE-ACETAMINOPHEN (PERCOCET/ROXICET) 5-325 MG PER TABLET    Take one tablet by mouth every 6 hours as needed For pain   PROMETHAZINE (PHENERGAN) 25 MG SUPPOSITORY    Place 25 mg rectally every 6 (six) hours as needed for nausea or vomiting.   ROPINIROLE (REQUIP) 0.25 MG TABLET    Take 0.25 mg by mouth 3 (three) times daily.    SENNA (SENOKOT) 8.6 MG TABS    Take 1 tablet by mouth at bedtime.     TRAZODONE (DESYREL) 50 MG TABLET    Take 50 mg by mouth at  bedtime. For insomnia   VENLAFAXINE (EFFEXOR) 37.5 MG TABLET    Take 50 mg by mouth 2 (two) times daily.   Modified Medications   No medications on file  Discontinued Medications   No medications on file    Physical Exam: Filed Vitals:   09/14/13 1422  BP: 109/55  Pulse: 65  Temp: 97.4 F (36.3 C)  Resp: 18  Height: 5' (1.524 m)  Weight: 130 lb (58.968 kg)  SpO2: 96%  Physical Exam  Constitutional: She appears well-nourished. No distress.  Cardiovascular: Normal rate, regular rhythm, normal heart sounds and intact distal pulses.   Pulmonary/Chest: Effort normal and breath sounds normal. No respiratory distress.  Abdominal: Soft. Bowel sounds are normal. She exhibits no distension and no mass. There is no tenderness.  Musculoskeletal:  Hemiparesis,  contracture of hand, visual loss from stroke  Neurological: She is alert.  Skin: Skin is warm and dry. There is pallor.    Labs reviewed: Basic Metabolic Panel:  Recent Labs  08/28/13  NA 141  K 4.4  BUN 11  CREATININE 0.7    Liver Function Tests:  Recent Labs  08/28/13  AST 16  ALT 15  ALKPHOS 73    CBC:  Recent Labs  08/28/13  WBC 5.2  HGB 15.3  HCT 45  PLT 151    Assessment/Plan 1. Insomnia -cont current trazodone, staff will continue to monitor but have not noted sleep deprivation  2. Depression due to dementia -conf effexor  3. Essential hypertension, benign -cont lopressor, lasix  4. Diabetic polyneuropathy associated with diabetes mellitus due to underlying condition -cont gabapentin  5. Cerebrovascular disease, arteriosclerotic, post-stroke -received therapy, has brace to help prevent contractures, gabapentin for pain, cont secondary prevention with bp, lipid control and baby asa  6. Senile osteoporosis -cont ca with D, may need 2000 units instead of 1000 to get to goal D level  Family/ staff Communication: seen with unit supervisor  Goals of care: long term care, DNR  Labs/tests ordered:  hba1c

## 2013-09-28 ENCOUNTER — Other Ambulatory Visit: Payer: Self-pay | Admitting: *Deleted

## 2013-09-28 MED ORDER — OXYCODONE-ACETAMINOPHEN 5-325 MG PO TABS: ORAL_TABLET | ORAL | Status: DC

## 2013-09-28 NOTE — Telephone Encounter (Signed)
[facility] 

## 2013-11-06 ENCOUNTER — Encounter: Payer: Self-pay | Admitting: Internal Medicine

## 2013-11-11 ENCOUNTER — Encounter: Payer: Self-pay | Admitting: Internal Medicine

## 2013-11-11 ENCOUNTER — Non-Acute Institutional Stay (SKILLED_NURSING_FACILITY): Payer: Medicare Other | Admitting: Internal Medicine

## 2013-11-11 DIAGNOSIS — R21 Rash and other nonspecific skin eruption: Secondary | ICD-10-CM

## 2013-11-11 DIAGNOSIS — B372 Candidiasis of skin and nail: Secondary | ICD-10-CM

## 2013-11-11 NOTE — Progress Notes (Signed)
Patient ID: Shelia Webb, female   DOB: May 03, 1921, 80 y.o.   MRN: [id number]   Place of Service: [facility]  Allergies  Allergen Reactions  . Penicillins Other (See Comments)    Redness and skin irritation    Code Status: DNR  Goals of Care: Comfort and Quality of Life/Long term care  Chief Complaint  Patient presents with  . Acute Visit    rash    HPI  80 y.o. female with PMH of DM2 with peripheral neuropthy, CVA, HTN, depression among many others is being seen for an acute visit at the request of nursing staff for a rash.    Review of Systems Constitutional: Negative for fever, chills, and fatigue. HENT: Negative for ear pain, congestion, and sore throat Cardiovascular: Negative for chest pain, palpitations, and leg swelling Respiratory: Negative cough, shortness of breath, and wheezing.  Skin: Positive for rash and pruritis    Past Medical History  Diagnosis Date  . Hypertension   . Diabetes mellitus   . Dysrhythmia     a-fib  . Anemia   . Acute respiratory failure   . Chronic kidney disease     Kidney failure  . Allergic rhinitis   . Osteoarthrosis   . Osteoporosis   . Anxiety   . Depression   . Restless leg syndrome   . GERD (gastroesophageal reflux disease)   . Hypertonic bladder   . Peripheral vascular disease   . Coronary atherosclerosis due to lipid rich plaque   . DNR (do not resuscitate)     Past Surgical History  Procedure Laterality Date  . Colon surgery      has colostomy  . Abdominal hysterectomy    . Appendectomy    . Tonsillectomy    . Cardiac catheterization      bilateral cataract surgery  . Diaphragmatic hernia repair      with gangrene  . Orif patella  12/07/2010    Procedure: OPEN REDUCTION INTERNAL (ORIF) FIXATION PATELLA;  Surgeon: Newt Minion, MD;  Location: [place];  Service: Orthopedics;  Laterality: Left;  Left Knee Hamstring Release and Open Reduction Internal Fixation Patella  . Orif patella   12/21/2010    Procedure: OPEN REDUCTION INTERNAL (ORIF) FIXATION PATELLA;  Surgeon: Newt Minion, MD;  Location: [place];  Service: Orthopedics;  Laterality: Left;  Left Knee Hamstring Release and Open Reduction Internal Fixation Patella    History   Social History  . Marital Status: Widowed    Spouse Name: N/A    Number of Children: N/A  . Years of Education: N/A   Occupational History  . Not on file.   Social History Main Topics  . Smoking status: Never Smoker   . Smokeless tobacco: Never Used  . Alcohol Use: No  . Drug Use: No  . Sexual Activity:    Other Topics Concern  . Not on file   Social History Narrative  . No narrative on file      Medication List       This list is accurate as of: 11/11/13 11:15 AM.  Always use your most recent med list.               aspirin EC 81 MG tablet  Take 81 mg by mouth daily.     calcium-vitamin D 500-200 MG-UNIT per tablet  Commonly known as:  OSCAL WITH D  Take 1 tablet by mouth daily.     cholecalciferol 1000 UNITS tablet  Commonly known as:  VITAMIN D  Take 2,000 Units by mouth daily.     cycloSPORINE 0.05 % ophthalmic emulsion  Commonly known as:  RESTASIS  Place 1 drop into both eyes 2 (two) times daily.     diclofenac sodium 1 % Gel  Commonly known as:  VOLTAREN  Apply 1 application topically 4 (four) times daily as needed (knee pain). For both knees     furosemide 20 MG tablet  Commonly known as:  LASIX  Take 20 mg by mouth daily.     gabapentin 300 MG capsule  Commonly known as:  NEURONTIN  Take 300 mg by mouth 3 (three) times daily.     insulin detemir 100 UNIT/ML injection  Commonly known as:  LEVEMIR  Inject 10 Units into the skin at bedtime.     ipratropium-albuterol 0.5-2.5 (3) MG/3ML Soln  Commonly known as:  DUONEB  Take 3 mLs by nebulization every 6 (six) hours as needed.     LACRI-LUBE OP  Place 1 drop into both eyes at bedtime.     LORazepam 0.5 MG tablet  Commonly known as:  ATIVAN    Take one tablet by mouth every 8 hours as needed for agitation/anxiety     metoprolol tartrate 12.5 mg Tabs tablet  Commonly known as:  LOPRESSOR  Take 12.5 mg by mouth 2 (two) times daily.     multivitamins ther. w/minerals Tabs tablet  Take 1 tablet by mouth daily.     omeprazole 20 MG tablet  Commonly known as:  PRILOSEC OTC  Take 20 mg by mouth daily.     oxyCODONE-acetaminophen 5-325 MG per tablet  Commonly known as:  PERCOCET/ROXICET  Take one tablet by mouth every 6 hours as needed For pain     promethazine 25 MG suppository  Commonly known as:  PHENERGAN  Place 25 mg rectally every 6 (six) hours as needed for nausea or vomiting.     rOPINIRole 0.25 MG tablet  Commonly known as:  REQUIP  Take 0.25 mg by mouth 3 (three) times daily.     senna 8.6 MG Tabs tablet  Commonly known as:  SENOKOT  Take 1 tablet by mouth at bedtime.     traZODone 50 MG tablet  Commonly known as:  DESYREL  Take 50 mg by mouth at bedtime. For insomnia     venlafaxine 37.5 MG tablet  Commonly known as:  EFFEXOR  Take 50 mg by mouth 2 (two) times daily.        Physical Exam Filed Vitals:   11/11/13 1110  BP: 110/65  Pulse: 67  Temp: 98.1 F (36.7 C)  Resp: 16   Constitutional: WDWN elderly female in no acute distress.  Neck: Supple and nontender. No lymphadenopathy, masses, or thyromegaly. No JVD or carotid bruits. Cardiac: Normal S1, S2. RRR without appreciable murmurs, rubs, or gallops. Lungs: No respiratory distress. Breath sounds clear bilaterally without rales, rhonchi, or wheezes. Skin: Warm and dry. Erythematous lesions scattered throughout upper torso and BUE. Erythematous confluent rash under both breasts. Nontender to palpation.   Neurological: Alert and oriented to person Psychiatric:  Appropriate mood and affect.   Assessment & Plan 1. Rash and nonspecific skin eruption Triamcinolone 0.025% topical cream three times daily until healing is complete. Notify provider if  rash persists or worsen. Continue to monitor.   2. Yeast dermatitis Nystatin topical powder 100,000 units: apply under both breasts three times daily until healing complete. Keeps skin dry. Continue to monitor  Family/Staff Communication Plan of care discuss with resident and professional staff members. Resident and professional staff members verbalize understanding and agree with plan of care. No additional questions or concerns reported.    Arthur Holms, MSN, AGNP-C [address] [place], [place] [address] [phone] [8am-5pm] After hours: [phone]  I have personally reviewed this note and agree with the care plan  Margaret [facility], MD  [facility] [phone] (Monday-Friday 8 am - 5 pm) [phone] (afterhours)

## 2013-11-23 ENCOUNTER — Other Ambulatory Visit: Payer: Self-pay | Admitting: *Deleted

## 2013-11-23 MED ORDER — OXYCODONE-ACETAMINOPHEN 5-325 MG PO TABS
ORAL_TABLET | ORAL | Status: DC
Start: 2013-11-23 — End: 2014-01-18

## 2013-11-23 NOTE — Telephone Encounter (Signed)
[facility] 

## 2014-01-10 ENCOUNTER — Non-Acute Institutional Stay (SKILLED_NURSING_FACILITY): Payer: Medicare Other | Admitting: Adult Health

## 2014-01-10 DIAGNOSIS — M170 Bilateral primary osteoarthritis of knee: Secondary | ICD-10-CM

## 2014-01-10 DIAGNOSIS — R1310 Dysphagia, unspecified: Secondary | ICD-10-CM

## 2014-01-10 DIAGNOSIS — Z8673 Personal history of transient ischemic attack (TIA), and cerebral infarction without residual deficits: Secondary | ICD-10-CM

## 2014-01-10 DIAGNOSIS — I672 Cerebral atherosclerosis: Secondary | ICD-10-CM

## 2014-01-10 DIAGNOSIS — E1149 Type 2 diabetes mellitus with other diabetic neurological complication: Secondary | ICD-10-CM

## 2014-01-10 DIAGNOSIS — K5900 Constipation, unspecified: Secondary | ICD-10-CM

## 2014-01-10 DIAGNOSIS — E1140 Type 2 diabetes mellitus with diabetic neuropathy, unspecified: Secondary | ICD-10-CM

## 2014-01-10 DIAGNOSIS — G2581 Restless legs syndrome: Secondary | ICD-10-CM

## 2014-01-18 ENCOUNTER — Other Ambulatory Visit: Payer: Self-pay | Admitting: *Deleted

## 2014-01-18 MED ORDER — OXYCODONE-ACETAMINOPHEN 5-325 MG PO TABS: ORAL_TABLET | ORAL | Status: DC

## 2014-01-18 NOTE — Telephone Encounter (Signed)
[facility] 

## 2014-01-21 ENCOUNTER — Encounter: Payer: Self-pay | Admitting: Adult Health

## 2014-01-21 NOTE — Progress Notes (Signed)
Patient ID: Shelia Webb, female   DOB: 24-Jun-1921, 81 y.o.   MRN: [id number]  [facility]     Allergies  Allergen Reactions  . Penicillins Other (See Comments)    Redness and skin irritation       Chief Complaint  Patient presents with  . Medical Management of Chronic Issues    HPI:  She is a long term resident of this facility being seen for the management of her chronic illnesses. She is unable to participate in the hpi or ros. She has continued to slowly decline over the past several months. There are no nursing concerns being voiced at this time.    Past Medical History  Diagnosis Date  . Hypertension   . Diabetes mellitus   . Dysrhythmia     a-fib  . Anemia   . Acute respiratory failure   . Chronic kidney disease     Kidney failure  . Allergic rhinitis   . Osteoarthrosis   . Osteoporosis   . Anxiety   . Depression   . Restless leg syndrome   . GERD (gastroesophageal reflux disease)   . Hypertonic bladder   . Peripheral vascular disease   . Coronary atherosclerosis due to lipid rich plaque   . DNR (do not resuscitate)     Past Surgical History  Procedure Laterality Date  . Colon surgery      has colostomy  . Abdominal hysterectomy    . Appendectomy    . Tonsillectomy    . Cardiac catheterization      bilateral cataract surgery  . Diaphragmatic hernia repair      with gangrene  . Orif patella  12/07/2010    Procedure: OPEN REDUCTION INTERNAL (ORIF) FIXATION PATELLA;  Surgeon: Newt Minion, MD;  Location: [place];  Service: Orthopedics;  Laterality: Left;  Left Knee Hamstring Release and Open Reduction Internal Fixation Patella  . Orif patella  12/21/2010    Procedure: OPEN REDUCTION INTERNAL (ORIF) FIXATION PATELLA;  Surgeon: Newt Minion, MD;  Location: [place];  Service: Orthopedics;  Laterality: Left;  Left Knee Hamstring Release and Open Reduction Internal Fixation Patella    VITAL SIGNS BP 102/58 mmHg  Pulse 66  Ht 5' (1.524 m)  Wt 126 lb  (57.153 kg)  BMI 24.61 kg/m2  SpO2 96%   Outpatient Encounter Prescriptions as of 01/10/2014  Medication Sig  . Artificial Tear Ointment (LACRI-LUBE OP) Place 1 drop into both eyes at bedtime.   Marland Kitchen aspirin EC 81 MG tablet Take 81 mg by mouth daily.  . calcium-vitamin D (OSCAL WITH D) 500-200 MG-UNIT per tablet Take 1 tablet by mouth daily.    . cholecalciferol (VITAMIN D) 1000 UNITS tablet Take 2,000 Units by mouth daily.  . cycloSPORINE (RESTASIS) 0.05 % ophthalmic emulsion Place 1 drop into both eyes 2 (two) times daily.   . diclofenac sodium (VOLTAREN) 1 % GEL Apply 1 application topically 4 (four) times daily as needed (knee pain). For both knees  . furosemide (LASIX) 20 MG tablet Take 20 mg by mouth daily.    Marland Kitchen gabapentin (NEURONTIN) 300 MG capsule Take 300 mg by mouth 3 (three) times daily.   . insulin detemir (LEVEMIR) 100 UNIT/ML injection Inject 10 Units into the skin at bedtime.   Marland Kitchen ipratropium-albuterol (DUONEB) 0.5-2.5 (3) MG/3ML SOLN Take 3 mLs by nebulization every 6 (six) hours as needed.  Marland Kitchen LORazepam (ATIVAN) 0.5 MG tablet Take one tablet by mouth every 8 hours as needed for  agitation/anxiety  . metoprolol tartrate (LOPRESSOR) 12.5 mg TABS tablet Take 12.5 mg by mouth 2 (two) times daily.  . Multiple Vitamins-Minerals (MULTIVITAMINS THER. W/MINERALS) TABS Take 1 tablet by mouth daily.    Marland Kitchen omeprazole (PRILOSEC OTC) 20 MG tablet Take 20 mg by mouth daily.  Marland Kitchen oxyCODONE-acetaminophen (PERCOCET/ROXICET) 5-325 MG per tablet Take one tablet by mouth every 6 hours as needed For pain  . promethazine (PHENERGAN) 25 MG suppository Place 25 mg rectally every 6 (six) hours as needed for nausea or vomiting.  Marland Kitchen rOPINIRole (REQUIP) 0.25 MG tablet Take 0.25 mg by mouth 3 (three) times daily.   Marland Kitchen senna (SENOKOT) 8.6 MG TABS Take 1 tablet by mouth at bedtime.    . traZODone (DESYREL) 50 MG tablet Take 50 mg by mouth at bedtime. For insomnia  . triamcinolone (KENALOG) 0.025 % cream Apply 1  application topically 2 (two) times daily.  Marland Kitchen venlafaxine (EFFEXOR) 37.5 MG tablet Take 50 mg by mouth 2 (two) times daily.      SIGNIFICANT DIAGNOSTIC EXAMS  LABS REVIEWED:   08-27-13: wbc 5.2; hgb 15.3; hct 44.9; mvc 88.2 ;plt 151; glucose 103; bun 11; creat 0.74; k+4.4; na++141; liver normal albumin 3.4; chol 160; ldl 84; trig 169; hgb a1c 6.8; vit d 33      Review of Systems  Unable to perform ROS    Physical Exam  Constitutional: No distress.  Frail   Neck: Neck supple. No JVD present. No thyromegaly present.  Cardiovascular: Normal rate, regular rhythm and intact distal pulses.   Respiratory: Effort normal and breath sounds normal. No respiratory distress.  GI: Soft. Bowel sounds are normal. She exhibits no distension.  Has colostomy   Musculoskeletal: She exhibits no edema.  has little movement in extremities   Neurological: She is alert.  Skin: Skin is warm and dry. She is not diaphoretic.       ASSESSMENT/ PLAN:  1. Dysphagia: no signs of aspiration; will continue honey thick liquids will monitor  2. Hypertension; will continue lopressor 12.5 mg twice daily   3. CVA: is neurologically without change; will continue asa 81 mg daily  4. Diabetes: will stop her levemir at this time; and will check hgb a1c will continue to monitor her cbg's.   5. Jerrye Bushy: will continue prilosec 20 mg daily  6. RLS: is stable will continue requip 0.25 mg three times daily  7. Neuropathic pain: her pain is presently being managed; will continue neurontin 300 mg three times daily   8. Edema: will continue lasix 20 mg daily  9. Osteoarthritis of knees: is presently stable; will continue voltaren gel to knees four times daily as needed and has percocet 5/325 mg every 6 hours as needed will monitor   10. Depression: is stable; will continue effexor 50 mg twice daily; and has ativan 0.5 mg every 8 hours as needed; takes trazodone 50 mg nightly   11. Constipation: will continue senna  daily   Will check hgb a1c     Ok Edwards NP [facility]  Contact [phone] Monday through Friday 8am- 5pm  After hours call [phone]

## 2014-02-03 ENCOUNTER — Encounter (HOSPITAL_COMMUNITY): Payer: Self-pay | Admitting: Orthopedic Surgery

## 2014-02-17 ENCOUNTER — Inpatient Hospital Stay (HOSPITAL_COMMUNITY)
Admission: EM | Admit: 2014-02-17 | Discharge: 2014-02-18 | DRG: 871 | Disposition: A | Discharge status: Other Acute Inpatient Hospital | Payer: Medicare Other | Attending: Internal Medicine | Admitting: Internal Medicine

## 2014-02-17 ENCOUNTER — Non-Acute Institutional Stay (SKILLED_NURSING_FACILITY): Payer: Medicare Other | Admitting: Adult Health

## 2014-02-17 ENCOUNTER — Encounter (HOSPITAL_COMMUNITY): Payer: Self-pay | Admitting: Emergency Medicine

## 2014-02-17 DIAGNOSIS — F0390 Unspecified dementia without behavioral disturbance: Secondary | ICD-10-CM | POA: Diagnosis present

## 2014-02-17 DIAGNOSIS — R32 Unspecified urinary incontinence: Secondary | ICD-10-CM | POA: Diagnosis present

## 2014-02-17 DIAGNOSIS — G629 Polyneuropathy, unspecified: Secondary | ICD-10-CM

## 2014-02-17 DIAGNOSIS — A419 Sepsis, unspecified organism: Principal | ICD-10-CM | POA: Diagnosis present

## 2014-02-17 DIAGNOSIS — F0280 Dementia in other diseases classified elsewhere without behavioral disturbance: Secondary | ICD-10-CM | POA: Diagnosis present

## 2014-02-17 DIAGNOSIS — I10 Essential (primary) hypertension: Secondary | ICD-10-CM

## 2014-02-17 DIAGNOSIS — Z66 Do not resuscitate: Secondary | ICD-10-CM | POA: Diagnosis present

## 2014-02-17 DIAGNOSIS — Z8673 Personal history of transient ischemic attack (TIA), and cerebral infarction without residual deficits: Secondary | ICD-10-CM

## 2014-02-17 DIAGNOSIS — I4891 Unspecified atrial fibrillation: Secondary | ICD-10-CM | POA: Diagnosis present

## 2014-02-17 DIAGNOSIS — K222 Esophageal obstruction: Secondary | ICD-10-CM | POA: Diagnosis present

## 2014-02-17 DIAGNOSIS — N189 Chronic kidney disease, unspecified: Secondary | ICD-10-CM | POA: Diagnosis present

## 2014-02-17 DIAGNOSIS — E860 Dehydration: Secondary | ICD-10-CM | POA: Diagnosis present

## 2014-02-17 DIAGNOSIS — Z880 Allergy status to penicillin: Secondary | ICD-10-CM

## 2014-02-17 DIAGNOSIS — I672 Cerebral atherosclerosis: Secondary | ICD-10-CM | POA: Diagnosis not present

## 2014-02-17 DIAGNOSIS — M810 Age-related osteoporosis without current pathological fracture: Secondary | ICD-10-CM | POA: Diagnosis present

## 2014-02-17 DIAGNOSIS — F0393 Unspecified dementia, unspecified severity, with mood disturbance: Secondary | ICD-10-CM | POA: Diagnosis present

## 2014-02-17 DIAGNOSIS — I2583 Coronary atherosclerosis due to lipid rich plaque: Secondary | ICD-10-CM | POA: Diagnosis present

## 2014-02-17 DIAGNOSIS — I739 Peripheral vascular disease, unspecified: Secondary | ICD-10-CM | POA: Diagnosis present

## 2014-02-17 DIAGNOSIS — F329 Major depressive disorder, single episode, unspecified: Secondary | ICD-10-CM | POA: Diagnosis present

## 2014-02-17 DIAGNOSIS — N318 Other neuromuscular dysfunction of bladder: Secondary | ICD-10-CM | POA: Diagnosis present

## 2014-02-17 DIAGNOSIS — D751 Secondary polycythemia: Secondary | ICD-10-CM | POA: Diagnosis present

## 2014-02-17 DIAGNOSIS — L89159 Pressure ulcer of sacral region, unspecified stage: Secondary | ICD-10-CM | POA: Diagnosis present

## 2014-02-17 DIAGNOSIS — R1310 Dysphagia, unspecified: Secondary | ICD-10-CM

## 2014-02-17 DIAGNOSIS — E871 Hypo-osmolality and hyponatremia: Secondary | ICD-10-CM | POA: Diagnosis present

## 2014-02-17 DIAGNOSIS — N390 Urinary tract infection, site not specified: Secondary | ICD-10-CM | POA: Diagnosis present

## 2014-02-17 DIAGNOSIS — G2581 Restless legs syndrome: Secondary | ICD-10-CM

## 2014-02-17 DIAGNOSIS — E1140 Type 2 diabetes mellitus with diabetic neuropathy, unspecified: Secondary | ICD-10-CM | POA: Diagnosis not present

## 2014-02-17 DIAGNOSIS — F419 Anxiety disorder, unspecified: Secondary | ICD-10-CM | POA: Diagnosis present

## 2014-02-17 DIAGNOSIS — L89619 Pressure ulcer of right heel, unspecified stage: Secondary | ICD-10-CM | POA: Diagnosis present

## 2014-02-17 DIAGNOSIS — Z90710 Acquired absence of both cervix and uterus: Secondary | ICD-10-CM

## 2014-02-17 DIAGNOSIS — E1149 Type 2 diabetes mellitus with other diabetic neurological complication: Secondary | ICD-10-CM

## 2014-02-17 DIAGNOSIS — E870 Hyperosmolality and hypernatremia: Secondary | ICD-10-CM | POA: Diagnosis present

## 2014-02-17 DIAGNOSIS — Z933 Colostomy status: Secondary | ICD-10-CM

## 2014-02-17 DIAGNOSIS — M170 Bilateral primary osteoarthritis of knee: Secondary | ICD-10-CM

## 2014-02-17 DIAGNOSIS — R4182 Altered mental status, unspecified: Secondary | ICD-10-CM | POA: Diagnosis not present

## 2014-02-17 DIAGNOSIS — M1990 Unspecified osteoarthritis, unspecified site: Secondary | ICD-10-CM | POA: Diagnosis present

## 2014-02-17 DIAGNOSIS — E872 Acidosis: Secondary | ICD-10-CM | POA: Diagnosis present

## 2014-02-17 DIAGNOSIS — Z9049 Acquired absence of other specified parts of digestive tract: Secondary | ICD-10-CM | POA: Diagnosis present

## 2014-02-17 DIAGNOSIS — K219 Gastro-esophageal reflux disease without esophagitis: Secondary | ICD-10-CM | POA: Diagnosis present

## 2014-02-17 DIAGNOSIS — G9340 Encephalopathy, unspecified: Secondary | ICD-10-CM | POA: Diagnosis present

## 2014-02-17 DIAGNOSIS — I129 Hypertensive chronic kidney disease with stage 1 through stage 4 chronic kidney disease, or unspecified chronic kidney disease: Secondary | ICD-10-CM | POA: Diagnosis present

## 2014-02-17 DIAGNOSIS — L89621 Pressure ulcer of left heel, stage 1: Secondary | ICD-10-CM | POA: Diagnosis present

## 2014-02-17 DIAGNOSIS — L89153 Pressure ulcer of sacral region, stage 3: Secondary | ICD-10-CM | POA: Diagnosis present

## 2014-02-17 LAB — CBC WITH DIFFERENTIAL/PLATELET
BASOS PCT: 0 % (ref 0–1)
Basophils Absolute: 0 10*3/uL (ref 0.0–0.1)
EOS ABS: 0.1 10*3/uL (ref 0.0–0.7)
Eosinophils Relative: 1 % (ref 0–5)
HCT: 60.5 % — ABNORMAL HIGH (ref 36.0–46.0)
HEMOGLOBIN: 19.8 g/dL — AB (ref 12.0–15.0)
Lymphocytes Relative: 19 % (ref 12–46)
Lymphs Abs: 2.6 10*3/uL (ref 0.7–4.0)
MCH: 32.4 pg (ref 26.0–34.0)
MCHC: 32.7 g/dL (ref 30.0–36.0)
MCV: 98.9 fL (ref 78.0–100.0)
MONOS PCT: 5 % (ref 3–12)
Monocytes Absolute: 0.6 10*3/uL (ref 0.1–1.0)
Neutro Abs: 10.4 10*3/uL — ABNORMAL HIGH (ref 1.7–7.7)
Neutrophils Relative %: 76 % (ref 43–77)
PLATELETS: 152 10*3/uL (ref 150–400)
RBC: 6.12 MIL/uL — ABNORMAL HIGH (ref 3.87–5.11)
RDW: 14.8 % (ref 11.5–15.5)
WBC: 13.7 10*3/uL — ABNORMAL HIGH (ref 4.0–10.5)

## 2014-02-17 LAB — PROTIME-INR
INR: 0.99 (ref 0.00–1.49)
Prothrombin Time: 13.2 seconds (ref 11.6–15.2)

## 2014-02-17 LAB — I-STAT TROPONIN, ED: Troponin i, poc: 0.01 ng/mL (ref 0.00–0.08)

## 2014-02-17 LAB — I-STAT CG4 LACTIC ACID, ED: Lactic Acid, Venous: 3.66 mmol/L (ref 0.5–2.0)

## 2014-02-17 MED ORDER — SODIUM CHLORIDE 0.9 % IV BOLUS (SEPSIS)
1000.0000 mL | Freq: Once | INTRAVENOUS | Status: AC
Start: 2014-02-17 — End: 2014-02-18
  Administered 2014-02-17: 1000 mL via INTRAVENOUS

## 2014-02-17 MED ORDER — SODIUM CHLORIDE 0.9 % IV BOLUS (SEPSIS)
1000.0000 mL | Freq: Once | INTRAVENOUS | Status: AC
  Administered 2014-02-18: 1000 mL via INTRAVENOUS

## 2014-02-17 NOTE — ED Notes (Signed)
Elevated CG-4 reported to Dr. Goldston 

## 2014-02-17 NOTE — ED Notes (Signed)
Staff at [facility] called advised the patient was having a cough and she is "sleepy".  She is alert most of the time but today all day, she has been "less alert".  According to EMS, the patient has been acting less alert for most of the day.  She has been wanting to sleep more than usual.  The patient has had a stroke in the past and it affected her speech.  She denies pain but has had a cough for the last couple of days so the family wanted her to be seen.

## 2014-02-17 NOTE — ED Provider Notes (Signed)
CSN: [id number]     Arrival date & time 02/17/14  2255 History  This chart was scribed for Everlene Balls, MD by Chester Holstein, ED Scribe. This patient was seen in room D30C/D30C and the patient's care was started at 11:09 PM.    Chief Complaint  Patient presents with  . Altered Mental Status    Staff at [facility] called advised the patient was having a cough and she is "sleepy".  She is alert most of the time but today all day, she has been "less alert".    LEVEL 5 CAVEAT- Alerted Mental Status  Patient is a 81 y.o. female presenting with altered mental status. The history is provided by the patient, the EMS personnel and the nursing home. The history is limited by the condition of the patient. No language interpreter was used.  Altered Mental Status  HPI Comments: PHILLIP SANDLER is a 81 y.o. female brought in by ambulance, with PMHx of HTN, DM, anemia, dysrhythmia, osteoporosis, osteoarthrosis, GERD, PVD, colostomy, who presents to the Emergency Department complaining of AMS. Pt has PMHx of CVA which affected her speech but she is able to respond yes and no to most questions. Pt lives at [facility] who states pt has been "less alert" today. They notes pt has had a cough and been fatigued different than baseline.  Pt denies pain and vomiting. She has no complaints.  Past Medical History  Diagnosis Date  . Hypertension   . Diabetes mellitus   . Dysrhythmia     a-fib  . Anemia   . Acute respiratory failure   . Chronic kidney disease     Kidney failure  . Allergic rhinitis   . Osteoarthrosis   . Osteoporosis   . Anxiety   . Depression   . Restless leg syndrome   . GERD (gastroesophageal reflux disease)   . Hypertonic bladder   . Peripheral vascular disease   . Coronary atherosclerosis due to lipid rich plaque   . DNR (do not resuscitate)    Past Surgical History  Procedure Laterality Date  . Colon surgery      has colostomy  . Abdominal hysterectomy    . Appendectomy    .  Tonsillectomy    . Cardiac catheterization      bilateral cataract surgery  . Diaphragmatic hernia repair      with gangrene  . Orif patella  12/21/2010    Procedure: OPEN REDUCTION INTERNAL (ORIF) FIXATION PATELLA;  Surgeon: Newt Minion, MD;  Location: [place];  Service: Orthopedics;  Laterality: Left;  Left Knee Hamstring Release and Open Reduction Internal Fixation Patella   History reviewed. No pertinent family history. History  Substance Use Topics  . Smoking status: Never Smoker   . Smokeless tobacco: Never Used  . Alcohol Use: No   OB History    No data available     Review of Systems  Unable to perform ROS: Mental status change      Allergies  Penicillins  Home Medications   Prior to Admission medications   Medication Sig Start Date End Date Taking? Authorizing Provider  Artificial Tear Ointment (LACRI-LUBE OP) Place 1 drop into both eyes at bedtime.     Historical Provider, MD  aspirin EC 81 MG tablet Take 81 mg by mouth daily.    Historical Provider, MD  calcium-vitamin D (OSCAL WITH D) 500-200 MG-UNIT per tablet Take 1 tablet by mouth daily.      Historical Provider, MD  cholecalciferol (VITAMIN D) 1000 UNITS tablet Take 2,000 Units by mouth daily.    Historical Provider, MD  cycloSPORINE (RESTASIS) 0.05 % ophthalmic emulsion Place 1 drop into both eyes 2 (two) times daily.     Historical Provider, MD  diclofenac sodium (VOLTAREN) 1 % GEL Apply 1 application topically 4 (four) times daily as needed (knee pain). For both knees    Historical Provider, MD  furosemide (LASIX) 20 MG tablet Take 20 mg by mouth daily.      Historical Provider, MD  gabapentin (NEURONTIN) 300 MG capsule Take 300 mg by mouth 3 (three) times daily.     Historical Provider, MD  insulin detemir (LEVEMIR) 100 UNIT/ML injection Inject 10 Units into the skin at bedtime.     Historical Provider, MD  ipratropium-albuterol (DUONEB) 0.5-2.5 (3) MG/3ML SOLN Take 3 mLs by nebulization every 6 (six)  hours as needed. 10/22/12   Gerlene Fee, NP  LORazepam (ATIVAN) 0.5 MG tablet Take one tablet by mouth every 8 hours as needed for agitation/anxiety 05/28/13   Gerlene Fee, NP  metoprolol tartrate (LOPRESSOR) 12.5 mg TABS tablet Take 12.5 mg by mouth 2 (two) times daily.    Historical Provider, MD  Multiple Vitamins-Minerals (MULTIVITAMINS THER. W/MINERALS) TABS Take 1 tablet by mouth daily.      Historical Provider, MD  omeprazole (PRILOSEC OTC) 20 MG tablet Take 20 mg by mouth daily.    Historical Provider, MD  oxyCODONE-acetaminophen (PERCOCET/ROXICET) 5-325 MG per tablet Take one tablet by mouth every 6 hours as needed For pain 01/18/14   Blanchie Serve, MD  promethazine (PHENERGAN) 25 MG suppository Place 25 mg rectally every 6 (six) hours as needed for nausea or vomiting.    Historical Provider, MD  rOPINIRole (REQUIP) 0.25 MG tablet Take 0.25 mg by mouth 3 (three) times daily.     Historical Provider, MD  senna (SENOKOT) 8.6 MG TABS Take 1 tablet by mouth at bedtime.      Historical Provider, MD  traZODone (DESYREL) 50 MG tablet Take 50 mg by mouth at bedtime. For insomnia 06/22/12   Delfina Redwood, MD  triamcinolone (KENALOG) 0.025 % cream Apply 1 application topically 2 (two) times daily.    Historical Provider, MD  venlafaxine (EFFEXOR) 37.5 MG tablet Take 50 mg by mouth 2 (two) times daily.     Historical Provider, MD   SpO2 95% Physical Exam  Constitutional: She appears well-developed and well-nourished. No distress.  HENT:  Head: Normocephalic and atraumatic.  Nose: Nose normal.  Mouth/Throat: Oropharynx is clear and moist. No oropharyngeal exudate.  Eyes: Conjunctivae and EOM are normal. Pupils are equal, round, and reactive to light. No scleral icterus.  Pinpoint pupils bilaterally  Neck: Normal range of motion. Neck supple. No JVD present. No tracheal deviation present. No thyromegaly present.  Cardiovascular: Normal rate, regular rhythm and normal heart sounds.  Exam  reveals no gallop and no friction rub.   No murmur heard. Pulmonary/Chest: Effort normal and breath sounds normal. No respiratory distress. She has no wheezes. She exhibits no tenderness.  Abdominal: Soft. Bowel sounds are normal. She exhibits no distension and no mass. There is no tenderness. There is no rebound and no guarding.  LLQ colostomy bag Infraumbilical hernia easily reducible  Musculoskeletal: Normal range of motion. She exhibits no edema or tenderness.  Lymphadenopathy:    She has no cervical adenopathy.  Neurological: She is alert. She exhibits normal muscle tone.  Patient follows commands. She answers yes or no to  questioning. Remainder of Neurological exam is difficult to obtain.  Skin: Skin is warm and dry. No rash noted. No erythema. No pallor.  Nursing note and vitals reviewed.   ED Course  Procedures (including critical care time) DIAGNOSTIC STUDIES: Oxygen Saturation is 95% on room air, normal by my interpretation.    COORDINATION OF CARE: 11:15 PM Discussed treatment plan with patient at beside, the patient agrees with the plan and has no further questions at this time.   Labs Review Labs Reviewed  CBC WITH DIFFERENTIAL/PLATELET - Abnormal; Notable for the following:    WBC 13.7 (*)    RBC 6.12 (*)    Hemoglobin 19.8 (*)    HCT 60.5 (*)    Neutro Abs 10.4 (*)    All other components within normal limits  COMPREHENSIVE METABOLIC PANEL - Abnormal; Notable for the following:    Sodium 154 (*)    Chloride 116 (*)    Glucose, Bld 250 (*)    BUN 37 (*)    Creatinine, Ser 1.13 (*)    AST 149 (*)    ALT 175 (*)    Alkaline Phosphatase 120 (*)    GFR calc non Af Amer 41 (*)    GFR calc Af Amer 48 (*)    All other components within normal limits  URINALYSIS, ROUTINE W REFLEX MICROSCOPIC - Abnormal; Notable for the following:    Color, Urine AMBER (*)    APPearance TURBID (*)    Hgb urine dipstick LARGE (*)    Protein, ur >300 (*)    Leukocytes, UA MODERATE  (*)    All other components within normal limits  URINE MICROSCOPIC-ADD ON - Abnormal; Notable for the following:    Bacteria, UA MANY (*)    All other components within normal limits  I-STAT CG4 LACTIC ACID, ED - Abnormal; Notable for the following:    Lactic Acid, Venous 3.66 (*)    All other components within normal limits  CBG MONITORING, ED - Abnormal; Notable for the following:    Glucose-Capillary 193 (*)    All other components within normal limits  I-STAT CG4 LACTIC ACID, ED - Abnormal; Notable for the following:    Lactic Acid, Venous 3.23 (*)    All other components within normal limits  URINE CULTURE  CULTURE, BLOOD (ROUTINE X 2)  CULTURE, BLOOD (ROUTINE X 2)  LIPASE, BLOOD  PROTIME-INR  I-STAT TROPOININ, ED    Imaging Review Ct Head Wo Contrast  02/18/2014   CLINICAL DATA:  Altered mental status. Less alert and sleeping more than usual. Strokes in the past affecting speech.  EXAM: CT HEAD WITHOUT CONTRAST  TECHNIQUE: Contiguous axial images were obtained from the base of the skull through the vertex without intravenous contrast.  COMPARISON:  03/24/2011  FINDINGS: Diffuse cerebral atrophy. No significant ventricular dilatation. Old lacunar infarct in the right basal ganglion. Low-attenuation changes in the deep white matter consistent with small vessel ischemia. No mass effect or midline shift. No abnormal extra-axial fluid collections. Gray-white matter junctions are distinct. Basal cisterns are not effaced. No evidence of acute intracranial hemorrhage. No depressed skull fractures. Opacification of the maxillary antra bilaterally with increased density material. This may represent inspissated mucus. Similar appearance to prior study. Mastoid air cells are not opacified. Vascular calcifications.  IMPRESSION: No acute intracranial abnormalities. Chronic atrophy and small vessel ischemic changes. Chronic opacification of paranasal sinuses.   Electronically Signed   By: Lucienne Capers M.D.   On: 02/18/2014 00:20  EKG Interpretation   Date/Time:  Thursday February 17 2014 23:03:37 EST Ventricular Rate:  90 PR Interval:  151 QRS Duration: 94 QT Interval:  377 QTC Calculation: 461 R Axis:   58 Text Interpretation:  Sinus rhythm Low voltage, extremity and precordial  leads Artifact Confirmed by Glynn Octave [phone]) on 02/17/2014  11:22:59 PM      MDM   Final diagnoses:  Altered mental state   Patient presents emergency department for altered mental status. Will evaluate with fingerstick, labs, CT head, and infectious workup.  Blood cultures obtained as well.  Patient is hypoxic to 92% on room air. Currently awaiting chest x-ray. Code sepsis initiated.  Urinalysis reveals an infection, after discussion with  hospitalist patient was given ceftriaxone. CT scan of the head is negative. Cause of altered mental status is likely sepsis from urinary tract infection. Patient is admitted to step down unit.  I personally performed the services described in this documentation, which was scribed in my presence. The recorded information has been reviewed and is accurate.     Everlene Balls, MD 02/18/14 [phone]

## 2014-02-18 ENCOUNTER — Emergency Department (HOSPITAL_COMMUNITY): Payer: Medicare Other

## 2014-02-18 ENCOUNTER — Encounter (HOSPITAL_COMMUNITY): Payer: Self-pay

## 2014-02-18 DIAGNOSIS — A419 Sepsis, unspecified organism: Secondary | ICD-10-CM | POA: Diagnosis present

## 2014-02-18 DIAGNOSIS — E872 Acidosis: Secondary | ICD-10-CM | POA: Diagnosis present

## 2014-02-18 DIAGNOSIS — F0390 Unspecified dementia without behavioral disturbance: Secondary | ICD-10-CM | POA: Diagnosis present

## 2014-02-18 DIAGNOSIS — Z8673 Personal history of transient ischemic attack (TIA), and cerebral infarction without residual deficits: Secondary | ICD-10-CM | POA: Diagnosis not present

## 2014-02-18 DIAGNOSIS — E860 Dehydration: Secondary | ICD-10-CM | POA: Diagnosis present

## 2014-02-18 DIAGNOSIS — I739 Peripheral vascular disease, unspecified: Secondary | ICD-10-CM | POA: Diagnosis present

## 2014-02-18 DIAGNOSIS — L89153 Pressure ulcer of sacral region, stage 3: Secondary | ICD-10-CM | POA: Diagnosis present

## 2014-02-18 DIAGNOSIS — N390 Urinary tract infection, site not specified: Secondary | ICD-10-CM | POA: Diagnosis not present

## 2014-02-18 DIAGNOSIS — Z66 Do not resuscitate: Secondary | ICD-10-CM | POA: Diagnosis present

## 2014-02-18 DIAGNOSIS — G9340 Encephalopathy, unspecified: Secondary | ICD-10-CM | POA: Diagnosis present

## 2014-02-18 DIAGNOSIS — I129 Hypertensive chronic kidney disease with stage 1 through stage 4 chronic kidney disease, or unspecified chronic kidney disease: Secondary | ICD-10-CM | POA: Diagnosis present

## 2014-02-18 DIAGNOSIS — E870 Hyperosmolality and hypernatremia: Secondary | ICD-10-CM | POA: Diagnosis present

## 2014-02-18 DIAGNOSIS — R1310 Dysphagia, unspecified: Secondary | ICD-10-CM

## 2014-02-18 DIAGNOSIS — M1990 Unspecified osteoarthritis, unspecified site: Secondary | ICD-10-CM | POA: Diagnosis present

## 2014-02-18 DIAGNOSIS — K222 Esophageal obstruction: Secondary | ICD-10-CM | POA: Diagnosis present

## 2014-02-18 DIAGNOSIS — N189 Chronic kidney disease, unspecified: Secondary | ICD-10-CM | POA: Diagnosis present

## 2014-02-18 DIAGNOSIS — I2583 Coronary atherosclerosis due to lipid rich plaque: Secondary | ICD-10-CM | POA: Diagnosis present

## 2014-02-18 DIAGNOSIS — G2581 Restless legs syndrome: Secondary | ICD-10-CM | POA: Diagnosis present

## 2014-02-18 DIAGNOSIS — F329 Major depressive disorder, single episode, unspecified: Secondary | ICD-10-CM | POA: Diagnosis present

## 2014-02-18 DIAGNOSIS — F419 Anxiety disorder, unspecified: Secondary | ICD-10-CM | POA: Diagnosis present

## 2014-02-18 DIAGNOSIS — Z933 Colostomy status: Secondary | ICD-10-CM | POA: Diagnosis not present

## 2014-02-18 DIAGNOSIS — R4182 Altered mental status, unspecified: Secondary | ICD-10-CM | POA: Diagnosis not present

## 2014-02-18 DIAGNOSIS — E871 Hypo-osmolality and hyponatremia: Secondary | ICD-10-CM | POA: Diagnosis present

## 2014-02-18 DIAGNOSIS — Z9049 Acquired absence of other specified parts of digestive tract: Secondary | ICD-10-CM | POA: Diagnosis present

## 2014-02-18 DIAGNOSIS — E1149 Type 2 diabetes mellitus with other diabetic neurological complication: Secondary | ICD-10-CM | POA: Diagnosis present

## 2014-02-18 DIAGNOSIS — Z880 Allergy status to penicillin: Secondary | ICD-10-CM | POA: Diagnosis not present

## 2014-02-18 DIAGNOSIS — R32 Unspecified urinary incontinence: Secondary | ICD-10-CM | POA: Diagnosis present

## 2014-02-18 DIAGNOSIS — F0280 Dementia in other diseases classified elsewhere without behavioral disturbance: Secondary | ICD-10-CM

## 2014-02-18 DIAGNOSIS — R400 Somnolence: Secondary | ICD-10-CM

## 2014-02-18 DIAGNOSIS — L89621 Pressure ulcer of left heel, stage 1: Secondary | ICD-10-CM | POA: Diagnosis present

## 2014-02-18 DIAGNOSIS — Z90710 Acquired absence of both cervix and uterus: Secondary | ICD-10-CM | POA: Diagnosis not present

## 2014-02-18 DIAGNOSIS — N318 Other neuromuscular dysfunction of bladder: Secondary | ICD-10-CM | POA: Diagnosis present

## 2014-02-18 DIAGNOSIS — I4891 Unspecified atrial fibrillation: Secondary | ICD-10-CM | POA: Diagnosis present

## 2014-02-18 DIAGNOSIS — E1140 Type 2 diabetes mellitus with diabetic neuropathy, unspecified: Secondary | ICD-10-CM | POA: Diagnosis not present

## 2014-02-18 DIAGNOSIS — D751 Secondary polycythemia: Secondary | ICD-10-CM | POA: Diagnosis present

## 2014-02-18 DIAGNOSIS — L89159 Pressure ulcer of sacral region, unspecified stage: Secondary | ICD-10-CM | POA: Diagnosis present

## 2014-02-18 DIAGNOSIS — K219 Gastro-esophageal reflux disease without esophagitis: Secondary | ICD-10-CM | POA: Diagnosis present

## 2014-02-18 DIAGNOSIS — L89619 Pressure ulcer of right heel, unspecified stage: Secondary | ICD-10-CM | POA: Diagnosis present

## 2014-02-18 DIAGNOSIS — M810 Age-related osteoporosis without current pathological fracture: Secondary | ICD-10-CM | POA: Diagnosis present

## 2014-02-18 LAB — INFLUENZA PANEL BY PCR (TYPE A & B)
H1N1FLUPCR: NOT DETECTED
Influenza A By PCR: NEGATIVE
Influenza B By PCR: NEGATIVE

## 2014-02-18 LAB — URINALYSIS, ROUTINE W REFLEX MICROSCOPIC
BILIRUBIN URINE: NEGATIVE
Glucose, UA: NEGATIVE mg/dL
KETONES UR: NEGATIVE mg/dL
NITRITE: NEGATIVE
Specific Gravity, Urine: 1.024 (ref 1.005–1.030)
UROBILINOGEN UA: 0.2 mg/dL (ref 0.0–1.0)
pH: 6.5 (ref 5.0–8.0)

## 2014-02-18 LAB — CBC WITH DIFFERENTIAL/PLATELET
BASOS PCT: 0 % (ref 0–1)
Basophils Absolute: 0 10*3/uL (ref 0.0–0.1)
Eosinophils Absolute: 0 10*3/uL (ref 0.0–0.7)
Eosinophils Relative: 0 % (ref 0–5)
HCT: 46.9 % — ABNORMAL HIGH (ref 36.0–46.0)
Hemoglobin: 15.2 g/dL — ABNORMAL HIGH (ref 12.0–15.0)
Lymphocytes Relative: 11 % — ABNORMAL LOW (ref 12–46)
Lymphs Abs: 1.6 10*3/uL (ref 0.7–4.0)
MCH: 31.1 pg (ref 26.0–34.0)
MCHC: 32.4 g/dL (ref 30.0–36.0)
MCV: 95.9 fL (ref 78.0–100.0)
Monocytes Absolute: 0.7 10*3/uL (ref 0.1–1.0)
Monocytes Relative: 5 % (ref 3–12)
NEUTROS PCT: 85 % — AB (ref 43–77)
Neutro Abs: 12.5 10*3/uL — ABNORMAL HIGH (ref 1.7–7.7)
Platelets: 110 10*3/uL — ABNORMAL LOW (ref 150–400)
RBC: 4.89 MIL/uL (ref 3.87–5.11)
RDW: 14.7 % (ref 11.5–15.5)
WBC: 14.8 10*3/uL — ABNORMAL HIGH (ref 4.0–10.5)

## 2014-02-18 LAB — I-STAT CG4 LACTIC ACID, ED
LACTIC ACID, VENOUS: 3.23 mmol/L — AB (ref 0.5–2.0)
LACTIC ACID, VENOUS: 6.86 mmol/L — AB (ref 0.5–2.0)

## 2014-02-18 LAB — COMPREHENSIVE METABOLIC PANEL
ALT: 175 U/L — ABNORMAL HIGH (ref 0–35)
ALT: 95 U/L — AB (ref 0–35)
ANION GAP: 13 (ref 5–15)
ANION GAP: 9 (ref 5–15)
AST: 149 U/L — ABNORMAL HIGH (ref 0–37)
AST: 64 U/L — ABNORMAL HIGH (ref 0–37)
Albumin: 3.8 g/dL (ref 3.5–5.2)
Albumin: 2.6 g/dL — ABNORMAL LOW (ref 3.5–5.2)
Alkaline Phosphatase: 120 U/L — ABNORMAL HIGH (ref 39–117)
Alkaline Phosphatase: 76 U/L (ref 39–117)
BILIRUBIN TOTAL: 0.9 mg/dL (ref 0.3–1.2)
BILIRUBIN TOTAL: 1.1 mg/dL (ref 0.3–1.2)
BUN: 37 mg/dL — AB (ref 6–23)
BUN: 30 mg/dL — AB (ref 6–23)
CHLORIDE: 116 mmol/L — AB (ref 96–112)
CHLORIDE: 123 mmol/L — AB (ref 96–112)
CO2: 25 mmol/L (ref 19–32)
CO2: 25 mmol/L (ref 19–32)
Calcium: 10.1 mg/dL (ref 8.4–10.5)
Calcium: 8.6 mg/dL (ref 8.4–10.5)
Creatinine, Ser: 1.13 mg/dL — ABNORMAL HIGH (ref 0.50–1.10)
Creatinine, Ser: 0.81 mg/dL (ref 0.50–1.10)
GFR calc Af Amer: 48 mL/min — ABNORMAL LOW (ref >90)
GFR calc Af Amer: 72 mL/min — ABNORMAL LOW (ref >90)
GFR calc non Af Amer: 41 mL/min — ABNORMAL LOW (ref >90)
GFR calc non Af Amer: 62 mL/min — ABNORMAL LOW (ref >90)
Glucose, Bld: 250 mg/dL — ABNORMAL HIGH (ref 70–99)
Glucose, Bld: 196 mg/dL — ABNORMAL HIGH (ref 70–99)
Potassium: 4.5 mmol/L (ref 3.5–5.1)
Potassium: 3.7 mmol/L (ref 3.5–5.1)
SODIUM: 154 mmol/L — AB (ref 135–145)
SODIUM: 157 mmol/L — AB (ref 135–145)
TOTAL PROTEIN: 8.2 g/dL (ref 6.0–8.3)
TOTAL PROTEIN: 5.5 g/dL — AB (ref 6.0–8.3)

## 2014-02-18 LAB — URINE MICROSCOPIC-ADD ON

## 2014-02-18 LAB — LIPASE, BLOOD: Lipase: 29 U/L (ref 11–59)

## 2014-02-18 LAB — GLUCOSE, CAPILLARY
GLUCOSE-CAPILLARY: 172 mg/dL — AB (ref 70–99)
Glucose-Capillary: 164 mg/dL — ABNORMAL HIGH (ref 70–99)
Glucose-Capillary: 164 mg/dL — ABNORMAL HIGH (ref 70–99)
Glucose-Capillary: 152 mg/dL — ABNORMAL HIGH (ref 70–99)
Glucose-Capillary: 143 mg/dL — ABNORMAL HIGH (ref 70–99)

## 2014-02-18 LAB — STREP PNEUMONIAE URINARY ANTIGEN: STREP PNEUMO URINARY ANTIGEN: NEGATIVE

## 2014-02-18 LAB — CBG MONITORING, ED: GLUCOSE-CAPILLARY: 193 mg/dL — AB (ref 70–99)

## 2014-02-18 LAB — MRSA PCR SCREENING: MRSA by PCR: NEGATIVE

## 2014-02-18 MED ORDER — ACETAMINOPHEN 650 MG RE SUPP: 650.0000 mg | Freq: Four times a day (QID) | RECTAL | Status: DC | PRN

## 2014-02-18 MED ORDER — ENOXAPARIN SODIUM 30 MG/0.3ML ~~LOC~~ SOLN: 30.0000 mg | SUBCUTANEOUS | Status: AC

## 2014-02-18 MED ORDER — CALCIUM CARBONATE-VITAMIN D 500-200 MG-UNIT PO TABS
1.0000 | ORAL_TABLET | Freq: Every day | ORAL | Status: DC
  Administered 2014-02-18: 1 via ORAL
  Filled 2014-02-18: qty 1

## 2014-02-18 MED ORDER — DEXTROSE 5 % IV SOLN
INTRAVENOUS | Status: DC
  Administered 2014-02-18: 100 mL via INTRAVENOUS

## 2014-02-18 MED ORDER — INSULIN ASPART 100 UNIT/ML ~~LOC~~ SOLN: 0.0000 [IU] | SUBCUTANEOUS | Status: AC

## 2014-02-18 MED ORDER — SODIUM CHLORIDE 0.45 % IV SOLN
INTRAVENOUS | Status: DC
  Administered 2014-02-18: 100 mL/h via INTRAVENOUS

## 2014-02-18 MED ORDER — OXYCODONE-ACETAMINOPHEN 5-325 MG PO TABS: 1.0000 | ORAL_TABLET | Freq: Four times a day (QID) | ORAL | Status: DC | PRN

## 2014-02-18 MED ORDER — ASPIRIN EC 81 MG PO TBEC
81.0000 mg | DELAYED_RELEASE_TABLET | Freq: Every day | ORAL | Status: DC
  Administered 2014-02-18: 81 mg via ORAL
  Filled 2014-02-18: qty 1

## 2014-02-18 MED ORDER — ONDANSETRON HCL 4 MG/2ML IJ SOLN
4.0000 mg | Freq: Four times a day (QID) | INTRAMUSCULAR | Status: DC | PRN
Start: 2014-02-18 — End: 2014-03-07

## 2014-02-18 MED ORDER — SENNA 8.6 MG PO TABS
1.0000 | ORAL_TABLET | Freq: Every day | ORAL | Status: DC
  Administered 2014-02-18: 8.6 mg via ORAL
  Filled 2014-02-18: qty 1

## 2014-02-18 MED ORDER — INSULIN ASPART 100 UNIT/ML ~~LOC~~ SOLN: 0.0000 [IU] | Freq: Every day | SUBCUTANEOUS | Status: DC

## 2014-02-18 MED ORDER — TRAZODONE HCL 50 MG PO TABS
50.0000 mg | ORAL_TABLET | Freq: Every day | ORAL | Status: DC
  Filled 2014-02-18: qty 1

## 2014-02-18 MED ORDER — PANTOPRAZOLE SODIUM 40 MG IV SOLR: 40.0000 mg | INTRAVENOUS | Status: DC

## 2014-02-18 MED ORDER — CEFTRIAXONE SODIUM 1 G IJ SOLR
1.0000 g | Freq: Once | INTRAMUSCULAR | Status: AC
  Administered 2014-02-18: 1 g via INTRAVENOUS
  Filled 2014-02-18: qty 10

## 2014-02-18 MED ORDER — ACETAMINOPHEN 325 MG PO TABS: 650.0000 mg | ORAL_TABLET | Freq: Four times a day (QID) | ORAL | Status: DC | PRN

## 2014-02-18 MED ORDER — DICLOFENAC SODIUM 1 % TD GEL: 1.0000 "application " | Freq: Four times a day (QID) | TRANSDERMAL | Status: DC | PRN

## 2014-02-18 MED ORDER — CYCLOSPORINE 0.05 % OP EMUL
1.0000 [drp] | Freq: Two times a day (BID) | OPHTHALMIC | Status: DC
  Administered 2014-02-18 (×2): 1 [drp] via OPHTHALMIC
  Filled 2014-02-18 (×2): qty 1

## 2014-02-18 MED ORDER — GABAPENTIN 300 MG PO CAPS
300.0000 mg | ORAL_CAPSULE | Freq: Three times a day (TID) | ORAL | Status: DC
Start: 2014-02-18 — End: 2014-02-18
  Administered 2014-02-18: 300 mg via ORAL
  Filled 2014-02-18 (×3): qty 1

## 2014-02-18 MED ORDER — DEXTROSE 5 % IV SOLN: 1.0000 g | INTRAVENOUS | Status: DC

## 2014-02-18 MED ORDER — PANTOPRAZOLE SODIUM 40 MG PO TBEC
40.0000 mg | DELAYED_RELEASE_TABLET | Freq: Every day | ORAL | Status: DC
  Administered 2014-02-18: 40 mg via ORAL
  Filled 2014-02-18: qty 1

## 2014-02-18 MED ORDER — INSULIN ASPART 100 UNIT/ML ~~LOC~~ SOLN
0.0000 [IU] | SUBCUTANEOUS | Status: DC
  Administered 2014-02-18 (×3): 3 [IU] via SUBCUTANEOUS

## 2014-02-18 MED ORDER — DEXTROSE 5 % IV SOLN
1.0000 g | INTRAVENOUS | Status: DC
  Filled 2014-02-18: qty 10

## 2014-02-18 MED ORDER — ONDANSETRON HCL 4 MG/2ML IJ SOLN: 4.0000 mg | Freq: Four times a day (QID) | INTRAMUSCULAR | Status: DC | PRN

## 2014-02-18 MED ORDER — SODIUM CHLORIDE 0.9 % IV BOLUS (SEPSIS)
1000.0000 mL | Freq: Once | INTRAVENOUS | Status: AC
  Administered 2014-02-18: 1000 mL via INTRAVENOUS

## 2014-02-18 MED ORDER — LORAZEPAM 0.5 MG PO TABS: 0.5000 mg | ORAL_TABLET | Freq: Three times a day (TID) | ORAL | Status: DC | PRN

## 2014-02-18 MED ORDER — ADULT MULTIVITAMIN W/MINERALS CH
1.0000 | ORAL_TABLET | Freq: Every day | ORAL | Status: DC
  Administered 2014-02-18: 1 via ORAL
  Filled 2014-02-18: qty 1

## 2014-02-18 MED ORDER — VENLAFAXINE HCL 50 MG PO TABS
50.0000 mg | ORAL_TABLET | Freq: Two times a day (BID) | ORAL | Status: DC
  Administered 2014-02-18 (×2): 50 mg via ORAL
  Filled 2014-02-18 (×3): qty 1

## 2014-02-18 MED ORDER — INSULIN ASPART 100 UNIT/ML ~~LOC~~ SOLN: 0.0000 [IU] | Freq: Three times a day (TID) | SUBCUTANEOUS | Status: DC

## 2014-02-18 MED ORDER — ROPINIROLE HCL 0.25 MG PO TABS
0.2500 mg | ORAL_TABLET | Freq: Three times a day (TID) | ORAL | Status: DC
  Administered 2014-02-18 (×3): 0.25 mg via ORAL
  Filled 2014-02-18 (×3): qty 1

## 2014-02-18 MED ORDER — VITAMIN D3 25 MCG (1000 UNIT) PO TABS
2000.0000 [IU] | ORAL_TABLET | Freq: Every day | ORAL | Status: DC
  Administered 2014-02-18: 2000 [IU] via ORAL
  Filled 2014-02-18: qty 2

## 2014-02-18 MED ORDER — ENOXAPARIN SODIUM 30 MG/0.3ML ~~LOC~~ SOLN
30.0000 mg | SUBCUTANEOUS | Status: DC
  Filled 2014-02-18: qty 0.3

## 2014-02-18 MED ORDER — PROMETHAZINE HCL 25 MG RE SUPP: 25.0000 mg | Freq: Four times a day (QID) | RECTAL | Status: DC | PRN

## 2014-02-18 MED ORDER — METOPROLOL TARTRATE 12.5 MG HALF TABLET
12.5000 mg | ORAL_TABLET | Freq: Two times a day (BID) | ORAL | Status: DC
  Administered 2014-02-18 (×2): 12.5 mg via ORAL
  Filled 2014-02-18 (×2): qty 1

## 2014-02-18 MED ORDER — ENOXAPARIN SODIUM 40 MG/0.4ML ~~LOC~~ SOLN
40.0000 mg | SUBCUTANEOUS | Status: DC
  Administered 2014-02-18: 40 mg via SUBCUTANEOUS
  Filled 2014-02-18: qty 0.4

## 2014-02-18 MED ORDER — DEXTROSE 5 % IV SOLN: 100.0000 mL | INTRAVENOUS | Status: DC

## 2014-02-18 MED ORDER — INSULIN DETEMIR 100 UNIT/ML ~~LOC~~ SOLN
10.0000 [IU] | Freq: Every day | SUBCUTANEOUS | Status: DC
  Administered 2014-02-18: 10 [IU] via SUBCUTANEOUS
  Filled 2014-02-18: qty 0.1

## 2014-02-18 MED ORDER — IPRATROPIUM-ALBUTEROL 0.5-2.5 (3) MG/3ML IN SOLN: 3.0000 mL | Freq: Four times a day (QID) | RESPIRATORY_TRACT | Status: DC | PRN

## 2014-02-18 MED ORDER — SODIUM CHLORIDE 0.9 % IJ SOLN
3.0000 mL | Freq: Two times a day (BID) | INTRAMUSCULAR | Status: DC
  Administered 2014-02-18 (×2): 3 mL via INTRAVENOUS

## 2014-02-18 NOTE — Clinical Social Work Note (Signed)
Patient is from [facility] ([facility]- resident since Jan. 2013).   Patient will be transferred to [facility]- facility aware and can readmit when pt is able to DC.  CSW signing off.  Domenica Reamer, [place] Social Worker [phone]

## 2014-02-18 NOTE — ED Notes (Signed)
The patient's granddaughter called from [place] and needed information on the patient.  She is the patient's HPOA and [facility] has the paperwork.  At first I advised her that I could not give her any information over the phone without a password.  She is listed under the emergency contacts and I asked Mrs. Ahlberg if I could give her granddaughter information about her over the phone she said yes.  I also set a password for them and she set it as "tater".  I advised her that when she calls she should use the password to receive information over the phone.

## 2014-02-18 NOTE — Progress Notes (Signed)
Patient ID: Shelia Webb  female  YYT:[id number]    DOB: [birth year]    DOA: 02/17/2014  PCP: Hollace Kinnier, DO  Brief history of present illness  Patient is a 81 year old female with hypertension, diabetes, atrial fibrillation, CAD, ckd, GERD, peripheral vascular disease, dementia/depression presented to ED from skilled nursing facility with altered mental status. Patient was noted to be less responsive, having cough and fatigue. Chest x-ray was negative for pneumonia. CT head negative for any intracranial abnormality. UA positive for UTI. BMET showed sodium of 154, creatinine 1.13 and transaminitis.   Assessment/Plan: Principal Problem:   Acute encephalopathy superimposed on dementia, worsened likely due to hyponatremia, sepsis, UTI, lactic acidosis - Continue IV fluid hydration, IV antibiotics, follow urine culture and sensitivities, blood cultures -  Per daughter in law, Ivin Booty, she is normally alert and oriented, only eats pured food. She has a long-standing history of dysphagia and esophageal stricture. Per Ivin Booty, patient's son, listed as contact in 32, Mr Ariea Rochin actually passed away 1 year ago. Okay to provide all updates to Ivin Booty or Abigail Butts (patient's granddaughter). -Discontinued Ativan, Neurontin, trazodone  Active Problems: Sepsis likely secondary to UTI - Continue IV Rocephin, follow urine culture, blood cultures - Mentioned in transfer records is having cough, will also check influenza panel, urine legionella antigen, urine strep antigen  Dysphagia, esophageal stricture - Continue nothing by mouth status for now, once alert and awake, will place swallowing evaluation and pure diet  Dehydration, hypernatremia, lactic acidosis - Sodium trending up to 157 today, placed on D5 water. - Placed on sliding scale insulin q4hours  Type 2 diabetes mellitus - Continue sliding scale insulin q4hours  Hypertension - Currently borderline BP secondary to sepsis, continue  metoprolol  DVT Prophylaxis:  Code Status:DO NOT RESUSCITATE  Family Communication: d/w daughter-in-law, Ivin Booty  Disposition:Remain in stepdown  Consultants: None  Procedures: none  Antibiotics:  IV Rocephin    Subjective: Patient seen and examined, lethargic, able to open her eyes and oriented to self. No acute issues currently. No fevers or chills, nausea or vomiting   Objective: Weight change:  No intake or output data in the 24 hours ending 02/18/14 1032 Blood pressure 112/74, pulse 84, temperature 98.1 F (36.7 C), temperature source Axillary, resp. rate 21, height 5' (1.524 m), weight 54 kg (119 lb 0.8 oz), SpO2 95 %.  Physical Exam: General:Somnolent but arousable and oriented to self  not in any acute distress. HEENT: anicteric sclera, PERLA, EOMI, Dry mucosal membranes  CVS: S1-S2 clear, no murmur rubs or gallops Chest:Decreased breath sounds at the bases  Abdomen: soft nontender, nondistended,left lower quadrant colostomy bag  Extremities: no cyanosis, clubbing or edema noted bilaterally Neuro:unable to examine, states yes and no but not able to follow other commands for neurological exam.   Lab Results: Basic Metabolic Panel:  Recent Labs Lab 02/17/14 2325 02/18/14 0711  NA 154* 157*  K 4.5 3.7  CL 116* 123*  CO2 25 25  GLUCOSE 250* 196*  BUN 37* 30*  CREATININE 1.13* 0.81  CALCIUM 10.1 8.6   Liver Function Tests:  Recent Labs Lab 02/17/14 2325 02/18/14 0711  AST 149* 64*  ALT 175* 95*  ALKPHOS 120* 76  BILITOT 0.9 1.1  PROT 8.2 5.5*  ALBUMIN 3.8 2.6*    Recent Labs Lab 02/17/14 2325  LIPASE 29   No results for input(s): AMMONIA in the last 168 hours. CBC:  Recent Labs Lab 02/17/14 2325 02/18/14 0711  WBC 13.7* 14.8*  NEUTROABS 10.4*  12.5*  HGB 19.8* 15.2*  HCT 60.5* 46.9*  MCV 98.9 95.9  PLT 152 110*   Cardiac Enzymes: No results for input(s): CKTOTAL, CKMB, CKMBINDEX, TROPONINI in the last 168 hours. BNP: Invalid  input(s): POCBNP CBG:  Recent Labs Lab 02/18/14 0019 02/18/14 0629 02/18/14 0834  GLUCAP 193* 164* 172*     Micro Results: No results found for this or any previous visit (from the past 240 hour(s)).  Studies/Results: Dg Chest 2 View  02/18/2014   CLINICAL DATA:  Patient is having cough and is sleepy. Less alert today. Altered mental status.  EXAM: CHEST  2 VIEW  COMPARISON:  06/17/2012  FINDINGS: Normal heart size and pulmonary vascularity. No focal airspace disease or consolidation in the lungs. No blunting of costophrenic angles. No pneumothorax. Tortuous aorta. Diffuse bone demineralization. Degenerative changes in the spine and shoulders.  IMPRESSION: No active cardiopulmonary disease.   Electronically Signed   By: Lucienne Capers M.D.   On: 02/18/2014 00:58   Ct Head Wo Contrast  02/18/2014   CLINICAL DATA:  Altered mental status. Less alert and sleeping more than usual. Strokes in the past affecting speech.  EXAM: CT HEAD WITHOUT CONTRAST  TECHNIQUE: Contiguous axial images were obtained from the base of the skull through the vertex without intravenous contrast.  COMPARISON:  03/24/2011  FINDINGS: Diffuse cerebral atrophy. No significant ventricular dilatation. Old lacunar infarct in the right basal ganglion. Low-attenuation changes in the deep white matter consistent with small vessel ischemia. No mass effect or midline shift. No abnormal extra-axial fluid collections. Gray-white matter junctions are distinct. Basal cisterns are not effaced. No evidence of acute intracranial hemorrhage. No depressed skull fractures. Opacification of the maxillary antra bilaterally with increased density material. This may represent inspissated mucus. Similar appearance to prior study. Mastoid air cells are not opacified. Vascular calcifications.  IMPRESSION: No acute intracranial abnormalities. Chronic atrophy and small vessel ischemic changes. Chronic opacification of paranasal sinuses.   Electronically  Signed   By: Lucienne Capers M.D.   On: 02/18/2014 00:20    Medications: Scheduled Meds: . aspirin EC  81 mg Oral Daily  . calcium-vitamin D  1 tablet Oral Daily  . cefTRIAXone (ROCEPHIN)  IV  1 g Intravenous Q24H  . cholecalciferol  2,000 Units Oral Daily  . cycloSPORINE  1 drop Both Eyes BID  . enoxaparin (LOVENOX) injection  30 mg Subcutaneous Q24H  . gabapentin  300 mg Oral TID  . insulin aspart  0-5 Units Subcutaneous QHS  . insulin aspart  0-9 Units Subcutaneous TID WC  . insulin detemir  10 Units Subcutaneous QHS  . metoprolol tartrate  12.5 mg Oral BID  . multivitamin with minerals  1 tablet Oral Daily  . pantoprazole  40 mg Oral Daily  . rOPINIRole  0.25 mg Oral TID  . senna  1 tablet Oral QHS  . sodium chloride  3 mL Intravenous Q12H  . traZODone  50 mg Oral QHS  . venlafaxine  50 mg Oral BID      LOS: 1 day   Lovis More M.D. [facility] 02/18/2014, 10:32 AM Pager: [phone]  If 7PM-7AM, please contact night-coverage [email or web address] Password [email or web address]

## 2014-02-18 NOTE — Care Management Note (Signed)
    Page 1 of 1   02/18/2014     1:51:59 PM CARE MANAGEMENT NOTE 02/18/2014  Patient:  Shelia Webb, Shelia Webb   Account Number:  [id number]  Date Initiated:  02/18/2014  Documentation initiated by:  Itzelle Gains  Subjective/Objective Assessment:   dx sepsis; resident of [facility]    PCP  Hollace Kinnier     Anticipated DC Date:  02/18/2014   Anticipated DC Plan:  LONG TERM ACUTE CARE (LTAC)     DC Planning Services  CM consult      Status of service:  Completed, signed off Medicare Important Message given?  NA - LOS <3 / Initial given by admissions (If response is "NO", the following Medicare IM given date fields will be blank) Date Medicare IM given:   Medicare IM given by:   Date Additional Medicare IM given:   Additional Medicare IM given by:    Discharge Disposition:  LONG TERM ACUTE CARE (LTAC)  Per UR Regulation:  Reviewed for med. necessity/level of care/duration of stay  Comments:  Contact:  Dory Peru  #[phone]  02/18/14 1200 [place] Received referral for LTAC screen - contacted 2 specialty hospitals, pt appropriate for transfer.  TC to [facility] and discussed 2 options available.  Granddtr chose [facility], notified attending.

## 2014-02-18 NOTE — Progress Notes (Signed)
1815 report called to [facility] with [facility].

## 2014-02-18 NOTE — ED Notes (Signed)
Patient is resting comfortably. 

## 2014-02-18 NOTE — Discharge Summary (Signed)
Physician Discharge Summary  Patient ID: FELICIDAD SUGARMAN MRN: [id number] DOB/AGE: September 05, 1921 81 y.o.  Admit date: 02/17/2014 Discharge date: 02/18/2014  Primary Care Physician:  Hollace Kinnier, DO  Discharge Diagnoses:    . Sepsis . Type II diabetes mellitus with neurological manifestations . UTI (urinary tract infection) . Hypernatremia . Acute encephalopathy . Essential hypertension . Depression due to dementia . Dehydration  Consults:  None   Recommendations for Outpatient Follow-up:  Please follow BMET closely    DIET: Currently nothing by mouth until alert and awake. Per patient's daughter-in-law she is  Puree diet due to long-standing history of dysphagia, esophageal stricture and aspiration  Allergies:   Allergies  Allergen Reactions  . Penicillins Other (See Comments)    Redness and skin irritation     Discharge Medications:   Medication List    STOP taking these medications        furosemide 20 MG tablet  Commonly known as:  LASIX     gabapentin 300 MG capsule  Commonly known as:  NEURONTIN     LORazepam 0.5 MG tablet  Commonly known as:  ATIVAN     multivitamins ther. w/minerals Tabs tablet     omeprazole 20 MG tablet  Commonly known as:  PRILOSEC OTC     traZODone 50 MG tablet  Commonly known as:  DESYREL     UNABLE TO FIND      TAKE these medications        artificial tears Oint ophthalmic ointment  Place 1 application into both eyes at bedtime.     aspirin EC 81 MG tablet  Take 81 mg by mouth daily.     CALAZIME SKIN PROTECTANT Pste  Apply topically daily.     calcium-vitamin D 500-200 MG-UNIT per tablet  Commonly known as:  OSCAL WITH D  Take 1 tablet by mouth daily.     cefTRIAXone 1 g in dextrose 5 % 50 mL  Inject 1 g into the vein daily.     cholecalciferol 1000 UNITS tablet  Commonly known as:  VITAMIN D  Take 2,000 Units by mouth daily.     cycloSPORINE 0.05 % ophthalmic emulsion  Commonly known as:  RESTASIS  Place  1 drop into both eyes 2 (two) times daily.     dextrose 5 % solution  Inject 100 mLs into the vein continuous.     diclofenac sodium 1 % Gel  Commonly known as:  VOLTAREN  Apply 1 application topically 4 (four) times daily as needed (knee pain). For both knees     enoxaparin 30 MG/0.3ML injection  Commonly known as:  LOVENOX  Inject 0.3 mLs (30 mg total) into the skin daily.     insulin aspart 100 UNIT/ML injection  Commonly known as:  novoLOG  Inject 0-15 Units into the skin every 4 (four) hours.     ipratropium-albuterol 0.5-2.5 (3) MG/3ML Soln  Commonly known as:  DUONEB  Take 3 mLs by nebulization every 6 (six) hours as needed.     LACRI-LUBE OP  Place 1 application into both eyes at bedtime.     metoprolol tartrate 12.5 mg Tabs tablet  Commonly known as:  LOPRESSOR  Take 12.5 mg by mouth 2 (two) times daily.     ondansetron 4 MG/2ML Soln injection  Commonly known as:  ZOFRAN  Inject 2 mLs (4 mg total) into the vein every 6 (six) hours as needed for nausea or vomiting.     oxyCODONE-acetaminophen 5-325 MG per  tablet  Commonly known as:  PERCOCET/ROXICET  Take one tablet by mouth every 6 hours as needed For pain     pantoprazole 40 MG injection  Commonly known as:  PROTONIX  Inject 40 mg into the vein daily.     promethazine 25 MG suppository  Commonly known as:  PHENERGAN  Place 25 mg rectally every 6 (six) hours as needed for nausea or vomiting.     rOPINIRole 0.25 MG tablet  Commonly known as:  REQUIP  Take 0.25 mg by mouth 3 (three) times daily.     senna 8.6 MG Tabs tablet  Commonly known as:  SENOKOT  Take 1 tablet by mouth at bedtime.     venlafaxine 37.5 MG tablet  Commonly known as:  EFFEXOR  Take 50 mg by mouth 2 (two) times daily.         Brief H and P: For complete details please refer to admission H and P, but in brief Patient is a 81 year old female with hypertension, diabetes, atrial fibrillation, CAD, ckd, GERD, peripheral vascular  disease, dementia/depression presented to ED from skilled nursing facility with altered mental status. Patient was noted to be less responsive, having cough and fatigue. Chest x-ray was negative for pneumonia. CT head negative for any intracranial abnormality. UA positive for UTI. BMET showed sodium of 154, creatinine 1.13 and transaminitis.  Hospital Course:   Acute encephalopathy superimposed on dementia, worsened likely due to hyponatremia, sepsis, UTI, lactic acidosis - Continue IV fluid hydration, IV antibiotics, follow urine culture and sensitivities, blood cultures - Per daughter in law, Ivin Booty, she is normally alert and oriented, only eats pured food. She has a long-standing history of dysphagia and esophageal stricture. Per Ivin Booty, patient's son, listed as contact in 47, Mr Shonteria Abeln actually passed away 1 year ago. Okay to provide all updates to Ivin Booty or Abigail Butts (patient's granddaughter). -Discontinued Ativan, Neurontin, trazodone  Sepsis likely secondary to UTI - Continue IV Rocephin, follow urine culture, blood cultures - Mentioned in transfer records is having cough, ordered influenza panel, urine legionella antigen, urine strep antigen  Dysphagia, esophageal stricture - Continue nothing by mouth status for now, once alert and awake, will place swallowing evaluation and pure diet  Dehydration, hypernatremia, lactic acidosis - Sodium trending up to 157 today, placed on D5 water. - Placed on sliding scale insulin q4hours  Type 2 diabetes mellitus - Continue sliding scale insulin q4hours  Hypertension - Currently borderline BP secondary to sepsis, continue metoprolol  Disposition; I received a call from the case manager that [facility]. Patient's POA, granddaughter, Ms Ozella Almond requested [facility].   Day of Discharge BP 112/74 mmHg  Pulse 84  Temp(Src) 98.1 F  (36.7 C) (Axillary)  Resp 21  Ht 5' (1.524 m)  Wt 54 kg (119 lb 0.8 oz)  BMI 23.25 kg/m2  SpO2 95%  Physical Exam: General: Lethargic but arousable, oriented to self not in any acute distress. HEENT: anicteric sclera, pupils reactive to light and accommodation, dry mucous membranes CVS: S1-S2 clear no murmur rubs or gallops Chest: Decreased breath sounds at the bases Abdomen: soft nontender, nondistended, normal bowel sounds Extremities: no cyanosis, clubbing or edema noted bilaterally Neuro: unable to examine, states yes and no but not able to follow other commands for full neurological exam   The results of significant diagnostics from this hospitalization (including imaging, microbiology, ancillary and laboratory) are listed below for  reference.    LAB RESULTS: Basic Metabolic Panel:  Recent Labs Lab 02/17/14 2325 02/18/14 0711  NA 154* 157*  K 4.5 3.7  CL 116* 123*  CO2 25 25  GLUCOSE 250* 196*  BUN 37* 30*  CREATININE 1.13* 0.81  CALCIUM 10.1 8.6   Liver Function Tests:  Recent Labs Lab 02/17/14 2325 02/18/14 0711  AST 149* 64*  ALT 175* 95*  ALKPHOS 120* 76  BILITOT 0.9 1.1  PROT 8.2 5.5*  ALBUMIN 3.8 2.6*    Recent Labs Lab 02/17/14 2325  LIPASE 29   No results for input(s): AMMONIA in the last 168 hours. CBC:  Recent Labs Lab 02/17/14 2325 02/18/14 0711  WBC 13.7* 14.8*  NEUTROABS 10.4* 12.5*  HGB 19.8* 15.2*  HCT 60.5* 46.9*  MCV 98.9 95.9  PLT 152 110*   Cardiac Enzymes: No results for input(s): CKTOTAL, CKMB, CKMBINDEX, TROPONINI in the last 168 hours. BNP: Invalid input(s): POCBNP CBG:  Recent Labs Lab 02/18/14 0834 02/18/14 1124  GLUCAP 172* 164*    Significant Diagnostic Studies:  Dg Chest 2 View  02/18/2014   CLINICAL DATA:  Patient is having cough and is sleepy. Less alert today. Altered mental status.  EXAM: CHEST  2 VIEW  COMPARISON:  06/17/2012  FINDINGS: Normal heart size and pulmonary vascularity. No focal  airspace disease or consolidation in the lungs. No blunting of costophrenic angles. No pneumothorax. Tortuous aorta. Diffuse bone demineralization. Degenerative changes in the spine and shoulders.  IMPRESSION: No active cardiopulmonary disease.   Electronically Signed   By: Lucienne Capers M.D.   On: 02/18/2014 00:58   Ct Head Wo Contrast  02/18/2014   CLINICAL DATA:  Altered mental status. Less alert and sleeping more than usual. Strokes in the past affecting speech.  EXAM: CT HEAD WITHOUT CONTRAST  TECHNIQUE: Contiguous axial images were obtained from the base of the skull through the vertex without intravenous contrast.  COMPARISON:  03/24/2011  FINDINGS: Diffuse cerebral atrophy. No significant ventricular dilatation. Old lacunar infarct in the right basal ganglion. Low-attenuation changes in the deep white matter consistent with small vessel ischemia. No mass effect or midline shift. No abnormal extra-axial fluid collections. Gray-white matter junctions are distinct. Basal cisterns are not effaced. No evidence of acute intracranial hemorrhage. No depressed skull fractures. Opacification of the maxillary antra bilaterally with increased density material. This may represent inspissated mucus. Similar appearance to prior study. Mastoid air cells are not opacified. Vascular calcifications.  IMPRESSION: No acute intracranial abnormalities. Chronic atrophy and small vessel ischemic changes. Chronic opacification of paranasal sinuses.   Electronically Signed   By: Lucienne Capers M.D.   On: 02/18/2014 00:20    :   Disposition and Follow-up:    DISPOSITION: [facility]     Follow-up Information    Follow up with REED, TIFFANY, DO. Schedule an appointment as soon as possible for a visit in 2 weeks.   Specialty:  Geriatric Medicine   Why:  for hospital follow-up   Contact information:   [address] [place] [place] [address] [phone]        Time spent  on Discharge: 33 mins  Signed:   RAI,RIPUDEEP M.D. [facility] 02/18/2014, 12:45 PM Pager: [phone]

## 2014-02-18 NOTE — Evaluation (Signed)
Clinical/Bedside Swallow Evaluation Patient Details  Name: Shelia Webb MRN: [id number] Date of Birth: 1921/05/13  Today's Date: 02/18/2014 Time: SLP Start Time (ACUTE ONLY): 1400 SLP Stop Time (ACUTE ONLY): 1416 SLP Time Calculation (min) (ACUTE ONLY): 16 min  Past Medical History:  Past Medical History  Diagnosis Date  . Hypertension   . Diabetes mellitus   . Dysrhythmia     a-fib  . Anemia   . Acute respiratory failure   . Chronic kidney disease     Kidney failure  . Allergic rhinitis   . Osteoarthrosis   . Osteoporosis   . Anxiety   . Depression   . Restless leg syndrome   . GERD (gastroesophageal reflux disease)   . Hypertonic bladder   . Peripheral vascular disease   . Coronary atherosclerosis due to lipid rich plaque   . DNR (do not resuscitate)    Past Surgical History:  Past Surgical History  Procedure Laterality Date  . Colon surgery      has colostomy  . Abdominal hysterectomy    . Appendectomy    . Tonsillectomy    . Cardiac catheterization      bilateral cataract surgery  . Diaphragmatic hernia repair      with gangrene  . Orif patella  12/21/2010    Procedure: OPEN REDUCTION INTERNAL (ORIF) FIXATION PATELLA;  Surgeon: Newt Minion, MD;  Location: [facility];  Service: Orthopedics;  Laterality: Left;  Left Knee Hamstring Release and Open Reduction Internal Fixation Patella   HPI:  81 yo female with hx of htn, dm2, apparently was found to have AMS at SNF. Family requested that pt be sent to ED for evaluation.Pt foudn to have UTI and hypernatremia. Per patient's daughter-in-law she is Puree diet due to long-standing history of dysphagia, esophageal stricture and aspiration. Patient with 4 prior MBS: Most recent MBS on 06/26/11 recommended Dys 1 (puree) with nectar thick liquids due to sensory impairment. Pt also with history of appearance of impedance from C3-C5, narrowing pharyngeal space, though this was not impacting function in 2013.    Assessment / Plan /  Recommendation Clinical Impression  Pt responsive, but remained very lethargic during assessment. She was able to request juice, but oral transit was signficantly weak with prolonged lingual pumping and delayed swallow initiation. Risk for aspiration is high until mentation improves, will f/u for trials tomorrow.     Aspiration Risk  Severe    Diet Recommendation NPO        Other  Recommendations Oral Care Recommendations: Oral care Q4 per protocol   Follow Up Recommendations       Frequency and Duration min 2x/week  2 weeks   Pertinent Vitals/Pain NA    SLP Swallow Goals     Swallow Study Prior Functional Status       General HPI: 81 yo female with hx of htn, dm2, apparently was found to have AMS at [facility]. Family requested that pt be sent to ED for evaluation.Pt foudn to have UTI and hypernatremia. Per patient's daughter-in-law she is Puree diet due to long-standing history of dysphagia, esophageal stricture and aspiration. Patient with 4 prior MBS: Most recent MBS on 06/26/11 recommended Dys 1 (puree) with nectar thick liquids due to sensory impairment. Pt also with history of appearance of impedance from C3-C5, narrowing pharyngeal space, though this was not impacting function in 2013.  Type of Study: Bedside swallow evaluation Previous Swallow Assessment: see HPI Diet Prior to this Study: NPO Temperature Spikes Noted:  No Respiratory Status: Room air History of Recent Intubation: No Behavior/Cognition: Lethargic Oral Cavity - Dentition: Edentulous Self-Feeding Abilities: Total assist Patient Positioning: Upright in bed Baseline Vocal Quality: Low vocal intensity Volitional Cough: Cognitively unable to elicit Volitional Swallow: Unable to elicit    Oral/Motor/Sensory Function Overall Oral Motor/Sensory Function:  (lethargic, question right labial droop) Labial ROM: Reduced right Labial Symmetry: Abnormal symmetry right Labial Strength: Reduced Lingual Strength:  Reduced Facial Strength: Reduced   Ice Chips     Thin Liquid Thin Liquid: Not tested    Nectar Thick Nectar Thick Liquid: Impaired Presentation: Spoon Oral Phase Impairments: Reduced labial seal;Reduced lingual movement/coordination;Impaired anterior to posterior transit Oral phase functional implications: Prolonged oral transit Pharyngeal Phase Impairments: Suspected delayed Swallow   Honey Thick Honey Thick Liquid: Not tested   Puree Puree: Not tested   Solid   GO    Solid: Not tested      Herbie Baltimore, MA CCC-SLP [phone]  Robbin Escher, Katherene Ponto 02/18/2014,2:22 PM

## 2014-02-18 NOTE — Consult Note (Signed)
WOC wound consult note Reason for Consult: evaluation of sacral and heel wounds. Colostomy. Pt from SNF with AMS, no family in the room. Pt unable to answer questions, she does respond yes or no to me after stimulation as we changed her wet linens.  Wound type:  Stage III Pressure ulcer sacrum and MASD (moisture associated skin damage) to surrounding tissues.  She is saturated with foul smelling urine at the time of my assessment and the dressing was also saturated. The bedside nurse reports this is the second time they have had to clean her of urine, therefore I have add a FC placement due to her Stage III Pressure ulcer.  sDTI (supected deep tissue injury) left heel Stage I Pressure ulcer right heel  Pressure Ulcer POA: Yes x 3 Measurement: Sacrum: 10cm x 6cm x with central area that is open and dark, moist tissue.  She has some definite areas of sheer with a shaggy appearence of the intact skin just circumferential to the open wound  Left heel: 3cm x 3cm x 0.1  Right heel: 2cm x 2cm x 0 Wound bed:  Sacrum see above.  Left heel has central dark area and surrounding ruptured bulla.  Right heel non blanchable redness.  Drainage (amount, consistency, odor)  None at the heel ulcers, hard to assess at the sacrum due to the saturation from the urine.  Periwound:  Intact at the heels, the sacrum appears to have some superficial redness adjacent to the actual wound, will need to monitor these sites once she is offloaded and kept dry for a bit.  Dressing procedure/placement/frequency: Sacral foam dressing for now, the area is full thickness but the depth does not warrant a filler dressing.  However if incontinence of bowel is an issue may need to reconsider different dressing issues as I would not want the foam to wick stool towards the wound.  Silicone foam to the left heel/Offload with Prevalon boot   Prevalon boot for the right heel to offload pressure ulcer, no topical wound   Add low air loss  mattress for pressure redistribution for the sacral ulcer.   Add foley catheter to manage the urinary incontinence and protect the pressure ulcer for increased healing  Notified CM with update on the [facility] team will follow along with you for weekly wound assessments.  Please notify me of any acute changes in the wounds or any new areas of concerns Para March RN,CWOCN [phone]

## 2014-02-18 NOTE — ED Notes (Signed)
Pt CBG result was 193

## 2014-02-18 NOTE — H&P (Addendum)
Shelia Webb is an 81 y.o. female.    Dellia Nims (pcp)  Chief Complaint: AMS HPI: 81 yo female with hx of htn, dm2, apparently was found to have AMS at [facility].  ? Family requested that pt be sent to ED for evaluation.  No family available in ED to speak with.  Pt responds to voice.  Pt noted to have uti and hypernatremia in ED and will be admitted for this. Pt is a poor historian and unable to give further history.   Past Medical History  Diagnosis Date  . Hypertension   . Diabetes mellitus   . Dysrhythmia     a-fib  . Anemia   . Acute respiratory failure   . Chronic kidney disease     Kidney failure  . Allergic rhinitis   . Osteoarthrosis   . Osteoporosis   . Anxiety   . Depression   . Restless leg syndrome   . GERD (gastroesophageal reflux disease)   . Hypertonic bladder   . Peripheral vascular disease   . Coronary atherosclerosis due to lipid rich plaque   . DNR (do not resuscitate)     Past Surgical History  Procedure Laterality Date  . Colon surgery      has colostomy  . Abdominal hysterectomy    . Appendectomy    . Tonsillectomy    . Cardiac catheterization      bilateral cataract surgery  . Diaphragmatic hernia repair      with gangrene  . Orif patella  12/21/2010    Procedure: OPEN REDUCTION INTERNAL (ORIF) FIXATION PATELLA;  Surgeon: Newt Minion, MD;  Location: [facility];  Service: Orthopedics;  Laterality: Left;  Left Knee Hamstring Release and Open Reduction Internal Fixation Patella    Family History  Problem Relation Age of Onset  . Family history unknown: Yes   Social History:  reports that she has never smoked. She has never used smokeless tobacco. She reports that she does not drink alcohol or use illicit drugs.  Allergies:  Allergies  Allergen Reactions  . Penicillins Other (See Comments)    Redness and skin irritation     (Not in a hospital admission)  Results for orders placed or performed during the hospital encounter of 02/17/14 (from the past  48 hour(s))  CBC with Differential/Platelet     Status: Abnormal   Collection Time: 02/17/14 11:25 PM  Result Value Ref Range   WBC 13.7 (H) 4.0 - 10.5 K/uL   RBC 6.12 (H) 3.87 - 5.11 MIL/uL   Hemoglobin 19.8 (H) 12.0 - 15.0 g/dL   HCT 60.5 (H) 36.0 - 46.0 %   MCV 98.9 78.0 - 100.0 fL   MCH 32.4 26.0 - 34.0 pg   MCHC 32.7 30.0 - 36.0 g/dL   RDW 14.8 11.5 - 15.5 %   Platelets 152 150 - 400 K/uL   Neutrophils Relative % 76 43 - 77 %   Neutro Abs 10.4 (H) 1.7 - 7.7 K/uL   Lymphocytes Relative 19 12 - 46 %   Lymphs Abs 2.6 0.7 - 4.0 K/uL   Monocytes Relative 5 3 - 12 %   Monocytes Absolute 0.6 0.1 - 1.0 K/uL   Eosinophils Relative 1 0 - 5 %   Eosinophils Absolute 0.1 0.0 - 0.7 K/uL   Basophils Relative 0 0 - 1 %   Basophils Absolute 0.0 0.0 - 0.1 K/uL  Comprehensive metabolic panel     Status: Abnormal   Collection Time: 02/17/14 11:25 PM  Result Value Ref Range   Sodium 154 (H) 135 - 145 mmol/L   Potassium 4.5 3.5 - 5.1 mmol/L   Chloride 116 (H) 96 - 112 mmol/L   CO2 25 19 - 32 mmol/L   Glucose, Bld 250 (H) 70 - 99 mg/dL   BUN 37 (H) 6 - 23 mg/dL   Creatinine, Ser 1.13 (H) 0.50 - 1.10 mg/dL   Calcium 10.1 8.4 - 10.5 mg/dL   Total Protein 8.2 6.0 - 8.3 g/dL   Albumin 3.8 3.5 - 5.2 g/dL   AST 149 (H) 0 - 37 U/L   ALT 175 (H) 0 - 35 U/L   Alkaline Phosphatase 120 (H) 39 - 117 U/L   Total Bilirubin 0.9 0.3 - 1.2 mg/dL   GFR calc non Af Amer 41 (L) >90 mL/min   GFR calc Af Amer 48 (L) >90 mL/min    Comment: (NOTE) The eGFR has been calculated using the CKD EPI equation. This calculation has not been validated in all clinical situations. eGFR's persistently <90 mL/min signify possible Chronic Kidney Disease.    Anion gap 13 5 - 15  Lipase, blood     Status: None   Collection Time: 02/17/14 11:25 PM  Result Value Ref Range   Lipase 29 11 - 59 U/L  Protime-INR     Status: None   Collection Time: 02/17/14 11:25 PM  Result Value Ref Range   Prothrombin Time 13.2 11.6 - 15.2  seconds   INR 0.99 0.00 - 1.49  I-stat troponin, ED     Status: None   Collection Time: 02/17/14 11:31 PM  Result Value Ref Range   Troponin i, poc 0.01 0.00 - 0.08 ng/mL   Comment 3            Comment: Due to the release kinetics of cTnI, a negative result within the first hours of the onset of symptoms does not rule out myocardial infarction with certainty. If myocardial infarction is still suspected, repeat the test at appropriate intervals.   I-Stat CG4 Lactic Acid, ED     Status: Abnormal   Collection Time: 02/17/14 11:33 PM  Result Value Ref Range   Lactic Acid, Venous 3.66 (HH) 0.5 - 2.0 mmol/L   Comment NOTIFIED PHYSICIAN   Urinalysis, Routine w reflex microscopic     Status: Abnormal   Collection Time: 02/17/14 11:59 PM  Result Value Ref Range   Color, Urine AMBER (A) YELLOW    Comment: BIOCHEMICALS MAY BE AFFECTED BY COLOR   APPearance TURBID (A) CLEAR   Specific Gravity, Urine 1.024 1.005 - 1.030   pH 6.5 5.0 - 8.0   Glucose, UA NEGATIVE NEGATIVE mg/dL   Hgb urine dipstick LARGE (A) NEGATIVE   Bilirubin Urine NEGATIVE NEGATIVE   Ketones, ur NEGATIVE NEGATIVE mg/dL   Protein, ur >300 (A) NEGATIVE mg/dL   Urobilinogen, UA 0.2 0.0 - 1.0 mg/dL   Nitrite NEGATIVE NEGATIVE   Leukocytes, UA MODERATE (A) NEGATIVE  Urine microscopic-add on     Status: Abnormal   Collection Time: 02/17/14 11:59 PM  Result Value Ref Range   WBC, UA TOO NUMEROUS TO COUNT <3 WBC/hpf   RBC / HPF 21-50 <3 RBC/hpf   Bacteria, UA MANY (A) RARE  CBG monitoring, ED     Status: Abnormal   Collection Time: 02/18/14 12:19 AM  Result Value Ref Range   Glucose-Capillary 193 (H) 70 - 99 mg/dL   Comment 1 Notify RN    Comment 2 Documented in Chart  I-Stat CG4 Lactic Acid, ED (not at [facility])     Status: Abnormal   Collection Time: 02/18/14 12:37 AM  Result Value Ref Range   Lactic Acid, Venous 3.23 (HH) 0.5 - 2.0 mmol/L   Comment NOTIFIED PHYSICIAN    Dg Chest 2 View  02/18/2014   CLINICAL DATA:   Patient is having cough and is sleepy. Less alert today. Altered mental status.  EXAM: CHEST  2 VIEW  COMPARISON:  06/17/2012  FINDINGS: Normal heart size and pulmonary vascularity. No focal airspace disease or consolidation in the lungs. No blunting of costophrenic angles. No pneumothorax. Tortuous aorta. Diffuse bone demineralization. Degenerative changes in the spine and shoulders.  IMPRESSION: No active cardiopulmonary disease.   Electronically Signed   By: Lucienne Capers M.D.   On: 02/18/2014 00:58   Ct Head Wo Contrast  02/18/2014   CLINICAL DATA:  Altered mental status. Less alert and sleeping more than usual. Strokes in the past affecting speech.  EXAM: CT HEAD WITHOUT CONTRAST  TECHNIQUE: Contiguous axial images were obtained from the base of the skull through the vertex without intravenous contrast.  COMPARISON:  03/24/2011  FINDINGS: Diffuse cerebral atrophy. No significant ventricular dilatation. Old lacunar infarct in the right basal ganglion. Low-attenuation changes in the deep white matter consistent with small vessel ischemia. No mass effect or midline shift. No abnormal extra-axial fluid collections. Gray-white matter junctions are distinct. Basal cisterns are not effaced. No evidence of acute intracranial hemorrhage. No depressed skull fractures. Opacification of the maxillary antra bilaterally with increased density material. This may represent inspissated mucus. Similar appearance to prior study. Mastoid air cells are not opacified. Vascular calcifications.  IMPRESSION: No acute intracranial abnormalities. Chronic atrophy and small vessel ischemic changes. Chronic opacification of paranasal sinuses.   Electronically Signed   By: Lucienne Capers M.D.   On: 02/18/2014 00:20    Review of Systems  Constitutional: Negative for fever, chills, weight loss, malaise/fatigue and diaphoresis.  HENT: Negative for congestion, ear discharge, ear pain, hearing loss, nosebleeds, sore throat and  tinnitus.   Eyes: Negative.   Respiratory: Negative.  Negative for stridor.   Cardiovascular: Negative.   Gastrointestinal: Negative.   Genitourinary: Negative.   Musculoskeletal: Negative.   Skin: Negative for itching and rash.  Neurological: Negative.  Negative for weakness and headaches.  Endo/Heme/Allergies: Negative.   Psychiatric/Behavioral: Negative.     Blood pressure 111/70, pulse 89, temperature 98.8 F (37.1 C), temperature source Rectal, resp. rate 30, SpO2 93 %. Physical Exam  Constitutional: She appears well-developed and well-nourished.  HENT:  Head: Normocephalic and atraumatic.  Eyes: Conjunctivae and EOM are normal. Pupils are equal, round, and reactive to light.  Neck: Normal range of motion. Neck supple. No JVD present. No tracheal deviation present. No thyromegaly present.  Cardiovascular: Normal rate and regular rhythm.  Exam reveals no gallop and no friction rub.   No murmur heard. Respiratory: Effort normal and breath sounds normal. No respiratory distress. She has no wheezes. She has no rales. She exhibits no tenderness.  GI: Soft. Bowel sounds are normal. She exhibits no distension. There is no tenderness. There is no rebound and no guarding.  Musculoskeletal: Normal range of motion. She exhibits no edema or tenderness.  Lymphadenopathy:    She has no cervical adenopathy.  Neurological: She is alert. She has normal reflexes. She displays normal reflexes. No cranial nerve deficit. She exhibits normal muscle tone. Coordination normal.  Skin: Skin is warm and dry. No rash noted. No erythema. No  pallor.  Psychiatric: She has a normal mood and affect. Her behavior is normal. Judgment and thought content normal.     Assessment/Plan AMS secondary to uti tx with rocephin  Hypernatremia Hydrate with 1/2 normal saline  Dm2 fsbs ac and qhs, iss  Polycythemia Check cbc in am  Thallasemia?   Check cbc in am  Hypertension Cont current tx other than  holding lasix temporarily  Sacral decubitus ulcer Please consult wound care nurse in am  Jani Gravel 02/18/2014, 1:09 AM

## 2014-02-19 ENCOUNTER — Other Ambulatory Visit (HOSPITAL_COMMUNITY): Payer: Medicare Other

## 2014-02-19 ENCOUNTER — Inpatient Hospital Stay
Admission: RE | Admit: 2014-02-19 | Discharge: 2014-03-03 | Disposition: A | Discharge status: Skilled Nursing Facility | Payer: Medicare Other | Source: Other Acute Inpatient Hospital | Attending: Internal Medicine | Admitting: Internal Medicine

## 2014-02-19 DIAGNOSIS — A419 Sepsis, unspecified organism: Secondary | ICD-10-CM

## 2014-02-19 DIAGNOSIS — K668 Other specified disorders of peritoneum: Secondary | ICD-10-CM

## 2014-02-19 DIAGNOSIS — Z79899 Other long term (current) drug therapy: Secondary | ICD-10-CM

## 2014-02-19 DIAGNOSIS — Z452 Encounter for adjustment and management of vascular access device: Secondary | ICD-10-CM

## 2014-02-19 DIAGNOSIS — Z4659 Encounter for fitting and adjustment of other gastrointestinal appliance and device: Secondary | ICD-10-CM

## 2014-02-19 LAB — CBC WITH DIFFERENTIAL/PLATELET
BASOS PCT: 0 % (ref 0–1)
Basophils Absolute: 0 10*3/uL (ref 0.0–0.1)
EOS ABS: 0.2 10*3/uL (ref 0.0–0.7)
Eosinophils Relative: 2 % (ref 0–5)
HCT: 42.9 % (ref 36.0–46.0)
Hemoglobin: 13.6 g/dL (ref 12.0–15.0)
LYMPHS PCT: 26 % (ref 12–46)
Lymphs Abs: 2.3 10*3/uL (ref 0.7–4.0)
MCH: 30.6 pg (ref 26.0–34.0)
MCHC: 31.7 g/dL (ref 30.0–36.0)
MCV: 96.4 fL (ref 78.0–100.0)
MONO ABS: 0.4 10*3/uL (ref 0.1–1.0)
Monocytes Relative: 4 % (ref 3–12)
NEUTROS ABS: 5.9 10*3/uL (ref 1.7–7.7)
Neutrophils Relative %: 67 % (ref 43–77)
PLATELETS: 113 10*3/uL — AB (ref 150–400)
RBC: 4.45 MIL/uL (ref 3.87–5.11)
RDW: 14.8 % (ref 11.5–15.5)
WBC: 8.7 10*3/uL (ref 4.0–10.5)

## 2014-02-19 LAB — LIPID PANEL
CHOL/HDL RATIO: 5.1 ratio
CHOLESTEROL: 127 mg/dL (ref 0–200)
HDL: 25 mg/dL — ABNORMAL LOW (ref >39)
LDL Cholesterol: 74 mg/dL (ref 0–99)
Triglycerides: 139 mg/dL (ref <150)
VLDL: 28 mg/dL (ref 0–40)

## 2014-02-19 LAB — URINALYSIS, ROUTINE W REFLEX MICROSCOPIC
BILIRUBIN URINE: NEGATIVE
Glucose, UA: NEGATIVE mg/dL
KETONES UR: NEGATIVE mg/dL
Nitrite: POSITIVE — AB
PH: 6.5 (ref 5.0–8.0)
Protein, ur: 100 mg/dL — AB
SPECIFIC GRAVITY, URINE: 1.02 (ref 1.005–1.030)
Urobilinogen, UA: 0.2 mg/dL (ref 0.0–1.0)

## 2014-02-19 LAB — URINE MICROSCOPIC-ADD ON

## 2014-02-19 LAB — POTASSIUM: Potassium: 4.4 mmol/L (ref 3.5–5.1)

## 2014-02-19 LAB — COMPREHENSIVE METABOLIC PANEL
ALBUMIN: 2.5 g/dL — AB (ref 3.5–5.2)
ALT: 67 U/L — AB (ref 0–35)
ANION GAP: 3 — AB (ref 5–15)
AST: 34 U/L (ref 0–37)
Alkaline Phosphatase: 71 U/L (ref 39–117)
BUN: 21 mg/dL (ref 6–23)
CHLORIDE: 116 mmol/L — AB (ref 96–112)
CO2: 29 mmol/L (ref 19–32)
CREATININE: 0.73 mg/dL (ref 0.50–1.10)
Calcium: 8.6 mg/dL (ref 8.4–10.5)
GFR calc Af Amer: 85 mL/min — ABNORMAL LOW (ref >90)
GFR calc non Af Amer: 73 mL/min — ABNORMAL LOW (ref >90)
Glucose, Bld: 101 mg/dL — ABNORMAL HIGH (ref 70–99)
Potassium: 2.9 mmol/L — ABNORMAL LOW (ref 3.5–5.1)
SODIUM: 148 mmol/L — AB (ref 135–145)
Total Bilirubin: 0.6 mg/dL (ref 0.3–1.2)
Total Protein: 5.5 g/dL — ABNORMAL LOW (ref 6.0–8.3)

## 2014-02-19 LAB — PROTIME-INR
INR: 1.12 (ref 0.00–1.49)
PROTHROMBIN TIME: 14.5 s (ref 11.6–15.2)

## 2014-02-19 LAB — HEMOGLOBIN A1C
Hgb A1c MFr Bld: 6.8 % — ABNORMAL HIGH (ref 4.8–5.6)
Mean Plasma Glucose: 148 mg/dL

## 2014-02-19 LAB — MAGNESIUM: Magnesium: 2 mg/dL (ref 1.5–2.5)

## 2014-02-19 LAB — PHOSPHORUS: Phosphorus: 2.8 mg/dL (ref 2.3–4.6)

## 2014-02-19 LAB — TSH: TSH: 1.26 u[IU]/mL (ref 0.350–4.500)

## 2014-02-19 LAB — PROCALCITONIN: Procalcitonin: 0.21 ng/mL

## 2014-02-20 LAB — T4, FREE: Free T4: 0.92 ng/dL (ref 0.80–1.80)

## 2014-02-20 LAB — BASIC METABOLIC PANEL
Anion gap: 5 (ref 5–15)
BUN: 9 mg/dL (ref 6–23)
CO2: 26 mmol/L (ref 19–32)
Calcium: 9 mg/dL (ref 8.4–10.5)
Chloride: 110 mmol/L (ref 96–112)
Creatinine, Ser: 0.6 mg/dL (ref 0.50–1.10)
GFR calc non Af Amer: 78 mL/min — ABNORMAL LOW (ref >90)
GLUCOSE: 174 mg/dL — AB (ref 70–99)
Potassium: 3.7 mmol/L (ref 3.5–5.1)
SODIUM: 141 mmol/L (ref 135–145)

## 2014-02-20 LAB — CBC WITH DIFFERENTIAL/PLATELET
BASOS PCT: 0 % (ref 0–1)
Basophils Absolute: 0 10*3/uL (ref 0.0–0.1)
Eosinophils Absolute: 0.1 10*3/uL (ref 0.0–0.7)
Eosinophils Relative: 2 % (ref 0–5)
HEMATOCRIT: 44 % (ref 36.0–46.0)
Hemoglobin: 14.6 g/dL (ref 12.0–15.0)
LYMPHS PCT: 24 % (ref 12–46)
Lymphs Abs: 1.5 10*3/uL (ref 0.7–4.0)
MCH: 30.8 pg (ref 26.0–34.0)
MCHC: 33.2 g/dL (ref 30.0–36.0)
MCV: 92.8 fL (ref 78.0–100.0)
MONO ABS: 0.4 10*3/uL (ref 0.1–1.0)
MONOS PCT: 6 % (ref 3–12)
NEUTROS ABS: 4.1 10*3/uL (ref 1.7–7.7)
Neutrophils Relative %: 68 % (ref 43–77)
PLATELETS: 135 10*3/uL — AB (ref 150–400)
RBC: 4.74 MIL/uL (ref 3.87–5.11)
RDW: 13.5 % (ref 11.5–15.5)
WBC: 6.1 10*3/uL (ref 4.0–10.5)

## 2014-02-20 LAB — VITAMIN B12: Vitamin B-12: 491 pg/mL (ref 211–911)

## 2014-02-20 LAB — FERRITIN: FERRITIN: 248 ng/mL (ref 10–291)

## 2014-02-21 LAB — BASIC METABOLIC PANEL
ANION GAP: 9 (ref 5–15)
BUN: <5 mg/dL — ABNORMAL LOW (ref 6–23)
CALCIUM: 9 mg/dL (ref 8.4–10.5)
CO2: 22 mmol/L (ref 19–32)
Chloride: 107 mmol/L (ref 96–112)
Creatinine, Ser: 0.55 mg/dL (ref 0.50–1.10)
GFR calc Af Amer: >90 mL/min (ref >90)
GFR, EST NON AFRICAN AMERICAN: 80 mL/min — AB (ref >90)
GLUCOSE: 164 mg/dL — AB (ref 70–99)
Potassium: 3.4 mmol/L — ABNORMAL LOW (ref 3.5–5.1)
SODIUM: 138 mmol/L (ref 135–145)

## 2014-02-21 LAB — LEGIONELLA ANTIGEN, URINE

## 2014-02-21 LAB — URINE CULTURE

## 2014-02-21 LAB — HEMOGLOBIN A1C
Hgb A1c MFr Bld: 7 % — ABNORMAL HIGH (ref 4.8–5.6)
Mean Plasma Glucose: 154 mg/dL

## 2014-02-21 LAB — MAGNESIUM: Magnesium: 1.9 mg/dL (ref 1.5–2.5)

## 2014-02-21 LAB — PHOSPHORUS: PHOSPHORUS: 3.5 mg/dL (ref 2.3–4.6)

## 2014-02-21 LAB — FOLATE RBC
FOLATE, RBC: 854 ng/mL (ref >498)
Folate, Hemolysate: 360.3 ng/mL
HEMATOCRIT: 42.2 % (ref 34.0–46.6)

## 2014-02-21 LAB — PROCALCITONIN: Procalcitonin: <0.1 ng/mL

## 2014-02-22 ENCOUNTER — Other Ambulatory Visit (HOSPITAL_COMMUNITY): Payer: Medicare Other

## 2014-02-22 LAB — CBC
HCT: 46.1 % — ABNORMAL HIGH (ref 36.0–46.0)
Hemoglobin: 15.8 g/dL — ABNORMAL HIGH (ref 12.0–15.0)
MCH: 30.7 pg (ref 26.0–34.0)
MCHC: 34.3 g/dL (ref 30.0–36.0)
MCV: 89.7 fL (ref 78.0–100.0)
PLATELETS: 154 10*3/uL (ref 150–400)
RBC: 5.14 MIL/uL — ABNORMAL HIGH (ref 3.87–5.11)
RDW: 13.4 % (ref 11.5–15.5)
WBC: 6.3 10*3/uL (ref 4.0–10.5)

## 2014-02-22 LAB — URINE CULTURE

## 2014-02-22 LAB — BASIC METABOLIC PANEL
Anion gap: 12 (ref 5–15)
CO2: 19 mmol/L (ref 19–32)
CREATININE: 0.64 mg/dL (ref 0.50–1.10)
Calcium: 9.2 mg/dL (ref 8.4–10.5)
Chloride: 108 mmol/L (ref 96–112)
GFR, EST AFRICAN AMERICAN: 88 mL/min — AB (ref >90)
GFR, EST NON AFRICAN AMERICAN: 76 mL/min — AB (ref >90)
Glucose, Bld: 171 mg/dL — ABNORMAL HIGH (ref 70–99)
Potassium: 3.6 mmol/L (ref 3.5–5.1)
Sodium: 139 mmol/L (ref 135–145)

## 2014-02-23 ENCOUNTER — Other Ambulatory Visit (HOSPITAL_COMMUNITY): Payer: Medicare Other

## 2014-02-23 LAB — PROCALCITONIN

## 2014-02-24 ENCOUNTER — Other Ambulatory Visit (HOSPITAL_COMMUNITY): Payer: Medicare Other

## 2014-02-24 LAB — CULTURE, BLOOD (ROUTINE X 2)
CULTURE: NO GROWTH
Culture: NO GROWTH

## 2014-02-24 LAB — URINALYSIS, ROUTINE W REFLEX MICROSCOPIC
BILIRUBIN URINE: NEGATIVE
Glucose, UA: NEGATIVE mg/dL
Ketones, ur: NEGATIVE mg/dL
Nitrite: NEGATIVE
PH: 6 (ref 5.0–8.0)
Protein, ur: NEGATIVE mg/dL
Specific Gravity, Urine: 1.01 (ref 1.005–1.030)
Urobilinogen, UA: 0.2 mg/dL (ref 0.0–1.0)

## 2014-02-24 LAB — URINE MICROSCOPIC-ADD ON

## 2014-02-24 MED ORDER — IOHEXOL 300 MG/ML  SOLN
50.0000 mL | Freq: Once | INTRAMUSCULAR | Status: AC | PRN
  Administered 2014-02-24: 5 mL via INTRAVENOUS

## 2014-02-24 MED ORDER — LIDOCAINE HCL 1 % IJ SOLN
INTRAMUSCULAR | Status: AC
  Filled 2014-02-24: qty 20

## 2014-02-24 NOTE — Procedures (Signed)
Placement of left arm PICC.  Tip in SVC and ready to use.

## 2014-02-25 LAB — URINE CULTURE

## 2014-02-26 LAB — VANCOMYCIN, TROUGH: Vancomycin Tr: 11 ug/mL (ref 10.0–20.0)

## 2014-02-28 LAB — BASIC METABOLIC PANEL
ANION GAP: 9 (ref 5–15)
BUN: 15 mg/dL (ref 6–23)
CHLORIDE: 105 mmol/L (ref 96–112)
CO2: 25 mmol/L (ref 19–32)
CREATININE: 0.5 mg/dL (ref 0.50–1.10)
Calcium: 9.2 mg/dL (ref 8.4–10.5)
GFR calc non Af Amer: 83 mL/min — ABNORMAL LOW (ref >90)
GLUCOSE: 125 mg/dL — AB (ref 70–99)
Potassium: 4.3 mmol/L (ref 3.5–5.1)
Sodium: 139 mmol/L (ref 135–145)

## 2014-02-28 LAB — CBC WITH DIFFERENTIAL/PLATELET
Basophils Absolute: 0 10*3/uL (ref 0.0–0.1)
Basophils Relative: 0 % (ref 0–1)
EOS ABS: 0.3 10*3/uL (ref 0.0–0.7)
Eosinophils Relative: 4 % (ref 0–5)
HCT: 41.9 % (ref 36.0–46.0)
HEMOGLOBIN: 14 g/dL (ref 12.0–15.0)
Lymphocytes Relative: 27 % (ref 12–46)
Lymphs Abs: 1.7 10*3/uL (ref 0.7–4.0)
MCH: 31 pg (ref 26.0–34.0)
MCHC: 33.4 g/dL (ref 30.0–36.0)
MCV: 92.9 fL (ref 78.0–100.0)
MONO ABS: 0.6 10*3/uL (ref 0.1–1.0)
Monocytes Relative: 10 % (ref 3–12)
NEUTROS ABS: 3.7 10*3/uL (ref 1.7–7.7)
NEUTROS PCT: 59 % (ref 43–77)
Platelets: 155 10*3/uL (ref 150–400)
RBC: 4.51 MIL/uL (ref 3.87–5.11)
RDW: 14.1 % (ref 11.5–15.5)
WBC: 6.3 10*3/uL (ref 4.0–10.5)

## 2014-03-01 LAB — VANCOMYCIN, TROUGH: Vancomycin Tr: 33.9 ug/mL (ref 10.0–20.0)

## 2014-03-02 LAB — VANCOMYCIN, RANDOM: Vancomycin Rm: 16.2 ug/mL

## 2014-03-04 ENCOUNTER — Non-Acute Institutional Stay (SKILLED_NURSING_FACILITY): Payer: Medicare Other | Admitting: Adult Health

## 2014-03-04 DIAGNOSIS — I10 Essential (primary) hypertension: Secondary | ICD-10-CM | POA: Diagnosis not present

## 2014-03-04 DIAGNOSIS — G629 Polyneuropathy, unspecified: Secondary | ICD-10-CM | POA: Diagnosis not present

## 2014-03-04 DIAGNOSIS — Z8673 Personal history of transient ischemic attack (TIA), and cerebral infarction without residual deficits: Secondary | ICD-10-CM | POA: Diagnosis not present

## 2014-03-04 DIAGNOSIS — E1140 Type 2 diabetes mellitus with diabetic neuropathy, unspecified: Secondary | ICD-10-CM | POA: Diagnosis not present

## 2014-03-04 DIAGNOSIS — K5900 Constipation, unspecified: Secondary | ICD-10-CM

## 2014-03-04 DIAGNOSIS — E1149 Type 2 diabetes mellitus with other diabetic neurological complication: Secondary | ICD-10-CM

## 2014-03-04 DIAGNOSIS — M792 Neuralgia and neuritis, unspecified: Secondary | ICD-10-CM | POA: Diagnosis not present

## 2014-03-04 DIAGNOSIS — K219 Gastro-esophageal reflux disease without esophagitis: Secondary | ICD-10-CM | POA: Diagnosis not present

## 2014-03-04 DIAGNOSIS — G2581 Restless legs syndrome: Secondary | ICD-10-CM | POA: Diagnosis not present

## 2014-03-04 DIAGNOSIS — I672 Cerebral atherosclerosis: Secondary | ICD-10-CM

## 2014-03-04 DIAGNOSIS — M170 Bilateral primary osteoarthritis of knee: Secondary | ICD-10-CM

## 2014-03-04 DIAGNOSIS — R1310 Dysphagia, unspecified: Secondary | ICD-10-CM

## 2014-03-07 ENCOUNTER — Encounter: Payer: Self-pay | Admitting: Adult Health

## 2014-03-07 DIAGNOSIS — Z933 Colostomy status: Secondary | ICD-10-CM | POA: Insufficient documentation

## 2014-03-07 NOTE — Progress Notes (Signed)
Patient ID: Shelia Webb, female   DOB: [birth year], 81 y.o.   MRN: [id number]  [facility] living [place]     Allergies  Allergen Reactions  . Penicillins Other (See Comments)    Redness and skin irritation       Chief Complaint  Patient presents with  . Medical Management of Chronic Issues    HPI:  She is a long term resident of this facility being seen for the management of her chronic illnesses. She is unable to participate in the hpi or ros. There are no nursing concerns at this time.    Past Medical History  Diagnosis Date  . Hypertension   . Diabetes mellitus   . Dysrhythmia     a-fib  . Anemia   . Acute respiratory failure   . Chronic kidney disease     Kidney failure  . Allergic rhinitis   . Osteoarthrosis   . Osteoporosis   . Anxiety   . Depression   . Restless leg syndrome   . GERD (gastroesophageal reflux disease)   . Hypertonic bladder   . Peripheral vascular disease   . Coronary atherosclerosis due to lipid rich plaque   . DNR (do not resuscitate)     Past Surgical History  Procedure Laterality Date  . Colon surgery      has colostomy  . Abdominal hysterectomy    . Appendectomy    . Tonsillectomy    . Cardiac catheterization      bilateral cataract surgery  . Diaphragmatic hernia repair      with gangrene  . Orif patella  12/21/2010    Procedure: OPEN REDUCTION INTERNAL (ORIF) FIXATION PATELLA;  Surgeon: Newt Minion, MD;  Location: [facility];  Service: Orthopedics;  Laterality: Left;  Left Knee Hamstring Release and Open Reduction Internal Fixation Patella    VITAL SIGNS BP 106/46 mmHg  Pulse 86  Ht 5' (1.524 m)  Wt 122 lb (55.339 kg)  BMI 23.83 kg/m2  SpO2 96%   Outpatient Encounter Prescriptions as of 02/17/2014  Medication Sig   . Artificial Tear Ointment (LACRI-LUBE OP) Place 1 drop into both eyes at bedtime.   Marland Kitchen aspirin EC 81 MG tablet Take 81 mg by mouth daily.  . calcium-vitamin D (OSCAL WITH D) 500-200 MG-UNIT per tablet Take  1 tablet by mouth daily.    . cholecalciferol (VITAMIN D) 1000 UNITS tablet Take 2,000 Units by mouth daily.  . cycloSPORINE (RESTASIS) 0.05 % ophthalmic emulsion Place 1 drop into both eyes 2 (two) times daily.   . diclofenac sodium (VOLTAREN) 1 % GEL Apply 1 application topically 4 (four) times daily as needed (knee pain). For both knees  . furosemide (LASIX) 20 MG tablet Take 20 mg by mouth daily.    Marland Kitchen gabapentin (NEURONTIN) 300 MG capsule Take 300 mg by mouth 3 (three) times daily.   . insulin detemir (LEVEMIR) 100 UNIT/ML injection Inject 10 Units into the skin at bedtime.   Marland Kitchen ipratropium-albuterol (DUONEB) 0.5-2.5 (3) MG/3ML SOLN Take 3 mLs by nebulization every 6 (six) hours as needed.  Marland Kitchen LORazepam (ATIVAN) 0.5 MG tablet Take one tablet by mouth every 8 hours as needed for agitation/anxiety  . metoprolol tartrate (LOPRESSOR) 12.5 mg TABS tablet Take 12.5 mg by mouth 2 (two) times daily.  . Multiple Vitamins-Minerals (MULTIVITAMINS THER. W/MINERALS) TABS Take 1 tablet by mouth daily.    Marland Kitchen omeprazole (PRILOSEC OTC) 20 MG tablet Take 20 mg by mouth daily.  Marland Kitchen oxyCODONE-acetaminophen (PERCOCET/ROXICET) 5-325  MG per tablet Take one tablet by mouth every 6 hours as needed For pain  . promethazine (PHENERGAN) 25 MG suppository Place 25 mg rectally every 6 (six) hours as needed for nausea or vomiting.  Marland Kitchen rOPINIRole (REQUIP) 0.25 MG tablet Take 0.25 mg by mouth 3 (three) times daily.   Marland Kitchen senna (SENOKOT) 8.6 MG TABS Take 1 tablet by mouth at bedtime.    . traZODone (DESYREL) 50 MG tablet Take 50 mg by mouth at bedtime. For insomnia  . triamcinolone (KENALOG) 0.025 % cream Apply 1 application topically 2 (two) times daily.  Marland Kitchen venlafaxine (EFFEXOR) 37.5 MG tablet Take 50 mg by mouth 2 (two) times daily.      SIGNIFICANT DIAGNOSTIC EXAMS  LABS REVIEWED:   08-27-13: wbc 5.2; hgb 15.3; hct 44.9; mvc 88.2 ;plt 151; glucose 103; bun 11; creat 0.74; k+4.4; na++141; liver normal albumin 3.4; chol 160; ldl  84; trig 169; hgb a1c 6.8; vit d 33       Review of Systems  Unable to perform ROS    Physical Exam Constitutional: No distress.  Frail   Neck: Neck supple. No JVD present. No thyromegaly present.  Cardiovascular: Normal rate, regular rhythm and intact distal pulses.   Respiratory: Effort normal and breath sounds normal. No respiratory distress.  GI: Soft. Bowel sounds are normal. She exhibits no distension.  Has colostomy   Musculoskeletal: She exhibits no edema.  has little movement in extremities   Neurological: She is alert.  Skin: Skin is warm and dry. She is not diaphoretic.    ASSESSMENT/ PLAN:  1. Dysphagia: no signs of aspiration; will continue honey thick liquids will monitor  2. Hypertension; will continue lopressor 12.5 mg twice daily   3. CVA: is neurologically without change; will continue asa 81 mg daily  4. Diabetes: will stop her levemir at this time; and will check hgb a1c will continue to monitor her cbg's.   5. Jerrye Bushy: will continue prilosec 20 mg daily  6. RLS: is stable will continue requip 0.25 mg three times daily  7. Neuropathic pain: her pain is presently being managed; will continue neurontin 300 mg three times daily   8. Edema: will continue lasix 20 mg daily  9. Osteoarthritis of knees: is presently stable; will continue voltaren gel to knees four times daily as needed and has percocet 5/325 mg every 6 hours as needed will monitor   10. Depression: is stable; will lower her effexor to 50 mg daily; and has ativan 0.5 mg every 8 hours as needed; takes trazodone 50 mg nightly   11. Constipation: will continue senna daily     Ok Edwards NP [facility]  Contact [phone] Monday through Friday 8am- 5pm  After hours call [phone]

## 2014-03-07 NOTE — Progress Notes (Signed)
Patient ID: Shelia Webb, female   DOB: 19-May-1921, 81 y.o.   MRN: [id number]  [name] living [place]     Allergies  Allergen Reactions  . Penicillins Other (See Comments)    Redness and skin irritation       Chief Complaint  Patient presents with  . Hospitalization Follow-up    HPI:  She has been hospitalized for sepsis due to uti (ecoli) and hypernatremia. During her hospitalization she was started on iv abt. Her sodium continued to climb; [facility] and her ivf. She is a long term resident of this facility. She is unable to participate in the hpi or ros. There are no nursing concerns at this time.    Past Medical History  Diagnosis Date  . Hypertension   . Diabetes mellitus   . Dysrhythmia     a-fib  . Anemia   . Acute respiratory failure   . Chronic kidney disease     Kidney failure  . Allergic rhinitis   . Osteoarthrosis   . Osteoporosis   . Anxiety   . Depression   . Restless leg syndrome   . GERD (gastroesophageal reflux disease)   . Hypertonic bladder   . Peripheral vascular disease   . Coronary atherosclerosis due to lipid rich plaque   . DNR (do not resuscitate)     Past Surgical History  Procedure Laterality Date  . Colon surgery      has colostomy  . Abdominal hysterectomy    . Appendectomy    . Tonsillectomy    . Cardiac catheterization      bilateral cataract surgery  . Diaphragmatic hernia repair      with gangrene  . Orif patella  12/21/2010    Procedure: OPEN REDUCTION INTERNAL (ORIF) FIXATION PATELLA;  Surgeon: Nadara Mustard, MD;  Location: [facility];  Service: Orthopedics;  Laterality: Left;  Left Knee Hamstring Release and Open Reduction Internal Fixation Patella    VITAL SIGNS BP 138/56 mmHg  Pulse 62  Ht 5' (1.524 m)  Wt 118 lb (53.524 kg)  BMI 23.05 kg/m2  SpO2 93%   Outpatient Encounter Prescriptions as of 03/04/2014  Medication Sig  . Artificial Tear Ointment (LACRI-LUBE  OP) Place 1 application into both eyes at bedtime.   Marland Kitchen aspirin EC 81 MG tablet Take 81 mg by mouth daily.  Marland Kitchen enoxaparin (LOVENOX) 30 MG/0.3ML injection Inject 0.3 mLs (30 mg total) into the skin daily.  . famotidine (PEPCID) 20 MG tablet Take 20 mg by mouth at bedtime.  . insulin aspart (NOVOLOG) 100 UNIT/ML injection Inject 0-15 Units into the skin every 4 (four) hours. (Patient taking differently: Inject 8 Units into the skin 3 (three) times daily with meals. )  . insulin detemir (LEVEMIR) 100 UNIT/ML injection Inject 10 Units into the skin at bedtime.  . metoprolol tartrate (LOPRESSOR) 12.5 mg TABS tablet Take 12.5 mg by mouth 2 (two) times daily.  Marland Kitchen oxyCODONE (OXY IR/ROXICODONE) 5 MG immediate release tablet Take 5 mg by mouth every 6 (six) hours as needed for severe pain.  Marland Kitchen rOPINIRole (REQUIP) 0.25 MG tablet Take 0.25 mg by mouth 3 (three) times daily.   Marland Kitchen senna (SENOKOT) 8.6 MG TABS Take 1 tablet by mouth at bedtime.    Marland Kitchen venlafaxine (EFFEXOR) 37.5 MG tablet Take 50 mg by mouth 2 (two) times daily.      SIGNIFICANT DIAGNOSTIC EXAMS  02-17-14: keg: sinus rhytm  02-18-14: ct  of head: no acute intracranial abnormalities. Chronic atrophy and small vessel ischemic changes. Chronic opacification of paranasal sinuses   02-19-14: chest x-ray; 1. Malpositioned right upper extremity picc. The catheter cannulates a peripheral vein at the level of the axillary vein. 2. New nasogastric tube is in good position.   02-22-14 :kub: nasogastric tube tip and side port in stomach. Right renal calculus. Bowel gas pattern unremarkable     LABS REVIEWED:   08-27-13: wbc 5.2; hgb 15.3; hct 44.9; mvc 88.2 ;plt 151; glucose 103; bun 11; creat 0.74; k+4.4; na++141; liver normal albumin 3.4; chol 160; ldl 84; trig 169; hgb a1c 6.8; vit d 33  02-17-14: wbc 13.7; hgb 19,8; hct 60.5 mcv 98.9; plt 152; glucose 250 bun 37; creat 1.13; k+4.5; na++154; alk phos 120; ast 149; alt 175; albumin 3.8; urine culture:  e-coli 02-18-14: wbc 14.8; hgb 15.2;hct 46.9; mcv 95.9; plt 110; glucose 196; bun 30; creat 0.81; k+3.7; na++157; alk phos 76; ast 64 alt 95;  hgb a1c 6.8  02-19-14: tsh 1.26; vit b12: 491; chol 127; ldl 74; trig 139; hdl 25; free t4: 0.92 03-02-14: wbc 12.1; hgb 10.5; hct 31.9; mcv 81.8; plt 58  glucose 147; bun 41; creat 1.49; k+5.0; na++134 alk phos 86; ast 86; alt 37; t bili 2.9; albumin 2.1 03-03-14: wbc 14.8; hgb 10.4; hct 32.1; mcv 83.2; plt 78; glucose 121; bun 45; creat 1.41; k+4.2; na++139; alk phos 94; ast 79; alt 44; t bili 2.7; albumin 2.7       ROS Unable to perform ROS   Physical Exam Constitutional: No distress.  Frail   Neck: Neck supple. No JVD present. No thyromegaly present.  Cardiovascular: Normal rate, regular rhythm and intact distal pulses.   Respiratory: Effort normal and breath sounds normal. No respiratory distress.  GI: Soft. Bowel sounds are normal. She exhibits no distension.  Has colostomy   Musculoskeletal: She exhibits no edema.  has little movement in extremities   Neurological: She is alert.  Skin: Skin is warm and dry. She is not diaphoretic.    ASSESSMENT/ PLAN:  1. Dysphagia: no signs of aspiration; will continue honey thick liquids will monitor  2. Hypertension; will continue lopressor 12.5 mg twice daily   3. CVA: is neurologically without change; will continue asa 81 mg daily  4. Diabetes: will continue levemir 10 units nightly and novolog 8 units prior to meals will monitor    5. Gerd: will continue pepcid 20 mg nightly   6. RLS: is stable will continue requip 0.25 mg three times daily  7. Neuropathic pain: her pain is presently being managed; has oxycodone 5 mg every 6 hours as needed will not make changes will monitor   8. Edema: is presently not on medications will not make changes will monitor   9. Osteoarthritis of knees: is presently stable; will continue voltaren gel to knees four times daily as needed and has percocet 5/325  mg every 6 hours as needed will monitor   10. Depression: is stable; will continue  effexor  50 mg twice daily; will not make changes will monitor  11. Constipation: will continue senna daily    Time spent with patient 50 minutes    Ok Edwards NP [facility]  Contact [phone] Monday through Friday 8am- 5pm  After hours call [phone]

## 2014-03-22 DEATH — deceased
